# Patient Record
Sex: Female | Born: 1935
Health system: Southern US, Community
[De-identification: ages and names within clinical notes are randomized; demographics above are authoritative.]

## PROBLEM LIST (undated history)

## (undated) DIAGNOSIS — M1990 Unspecified osteoarthritis, unspecified site: Secondary | ICD-10-CM

## (undated) DIAGNOSIS — H269 Unspecified cataract: Secondary | ICD-10-CM

## (undated) DIAGNOSIS — H6990 Unspecified Eustachian tube disorder, unspecified ear: Secondary | ICD-10-CM

## (undated) DIAGNOSIS — D649 Anemia, unspecified: Secondary | ICD-10-CM

## (undated) DIAGNOSIS — H6980 Other specified disorders of Eustachian tube, unspecified ear: Secondary | ICD-10-CM

## (undated) DIAGNOSIS — Z5189 Encounter for other specified aftercare: Secondary | ICD-10-CM

## (undated) DIAGNOSIS — R3129 Other microscopic hematuria: Secondary | ICD-10-CM

## (undated) HISTORY — DX: Unspecified osteoarthritis, unspecified site: M19.90

## (undated) HISTORY — PX: DILATION AND CURETTAGE OF UTERUS: SHX78

## (undated) HISTORY — DX: Unspecified eustachian tube disorder, unspecified ear: H69.90

## (undated) HISTORY — DX: Other microscopic hematuria: R31.29

## (undated) HISTORY — DX: Anemia, unspecified: D64.9

## (undated) HISTORY — DX: Unspecified cataract: H26.9

## (undated) HISTORY — PX: COLONOSCOPY: SHX174

## (undated) HISTORY — PX: ABDOMINAL HYSTERECTOMY: SHX81

## (undated) HISTORY — DX: Other specified disorders of Eustachian tube, unspecified ear: H69.80

## (undated) HISTORY — PX: APPENDECTOMY: SHX54

## (undated) HISTORY — PX: TONSILLECTOMY: SUR1361

## (undated) HISTORY — DX: Encounter for other specified aftercare: Z51.89

---

## 1999-07-14 ENCOUNTER — Encounter: Payer: Self-pay | Admitting: Obstetrics and Gynecology

## 1999-07-14 ENCOUNTER — Encounter: Admission: RE | Admit: 1999-07-14 | Discharge: 1999-07-14 | Payer: Self-pay | Admitting: Obstetrics and Gynecology

## 2000-07-15 ENCOUNTER — Encounter: Admission: RE | Admit: 2000-07-15 | Discharge: 2000-07-15 | Payer: Self-pay | Admitting: Family Medicine

## 2000-07-15 ENCOUNTER — Encounter: Payer: Self-pay | Admitting: Family Medicine

## 2001-05-04 ENCOUNTER — Encounter: Payer: Self-pay | Admitting: Internal Medicine

## 2001-06-28 ENCOUNTER — Other Ambulatory Visit: Admission: RE | Admit: 2001-06-28 | Discharge: 2001-06-28 | Payer: Self-pay | Admitting: Obstetrics & Gynecology

## 2001-07-25 ENCOUNTER — Encounter: Payer: Self-pay | Admitting: Family Medicine

## 2001-07-25 ENCOUNTER — Encounter: Admission: RE | Admit: 2001-07-25 | Discharge: 2001-07-25 | Payer: Self-pay | Admitting: Family Medicine

## 2002-07-24 ENCOUNTER — Other Ambulatory Visit: Admission: RE | Admit: 2002-07-24 | Discharge: 2002-07-24 | Payer: Self-pay | Admitting: Obstetrics & Gynecology

## 2002-07-31 ENCOUNTER — Encounter: Admission: RE | Admit: 2002-07-31 | Discharge: 2002-07-31 | Payer: Self-pay | Admitting: Family Medicine

## 2002-07-31 ENCOUNTER — Encounter: Payer: Self-pay | Admitting: Family Medicine

## 2003-04-30 ENCOUNTER — Encounter: Payer: Self-pay | Admitting: Internal Medicine

## 2003-08-21 ENCOUNTER — Other Ambulatory Visit: Admission: RE | Admit: 2003-08-21 | Discharge: 2003-08-21 | Payer: Self-pay | Admitting: Obstetrics and Gynecology

## 2003-08-30 ENCOUNTER — Encounter: Admission: RE | Admit: 2003-08-30 | Discharge: 2003-08-30 | Payer: Self-pay | Admitting: Family Medicine

## 2004-04-28 ENCOUNTER — Ambulatory Visit: Payer: Self-pay | Admitting: Family Medicine

## 2004-09-11 ENCOUNTER — Encounter: Admission: RE | Admit: 2004-09-11 | Discharge: 2004-09-11 | Payer: Self-pay | Admitting: Family Medicine

## 2004-09-30 ENCOUNTER — Ambulatory Visit: Payer: Self-pay | Admitting: Family Medicine

## 2005-03-26 ENCOUNTER — Ambulatory Visit: Payer: Self-pay | Admitting: Family Medicine

## 2005-08-25 ENCOUNTER — Ambulatory Visit: Payer: Self-pay | Admitting: Internal Medicine

## 2005-09-13 ENCOUNTER — Encounter: Payer: Self-pay | Admitting: Internal Medicine

## 2005-09-13 ENCOUNTER — Encounter: Admission: RE | Admit: 2005-09-13 | Discharge: 2005-09-13 | Payer: Self-pay | Admitting: Internal Medicine

## 2005-10-22 ENCOUNTER — Ambulatory Visit: Payer: Self-pay | Admitting: Internal Medicine

## 2005-11-08 ENCOUNTER — Ambulatory Visit: Payer: Self-pay | Admitting: Internal Medicine

## 2006-01-24 ENCOUNTER — Ambulatory Visit: Payer: Self-pay | Admitting: Internal Medicine

## 2006-09-16 ENCOUNTER — Encounter: Admission: RE | Admit: 2006-09-16 | Discharge: 2006-09-16 | Payer: Self-pay | Admitting: Internal Medicine

## 2006-09-27 ENCOUNTER — Encounter: Admission: RE | Admit: 2006-09-27 | Discharge: 2006-09-27 | Payer: Self-pay | Admitting: Internal Medicine

## 2006-10-26 DIAGNOSIS — Z9889 Other specified postprocedural states: Secondary | ICD-10-CM

## 2006-10-28 ENCOUNTER — Ambulatory Visit: Payer: Self-pay | Admitting: Internal Medicine

## 2006-10-28 LAB — CONVERTED CEMR LAB
ALT: 15 units/L (ref 0–40)
Bilirubin, Direct: 0.1 mg/dL (ref 0.0–0.3)
Cholesterol: 196 mg/dL (ref 0–200)
HDL: 58 mg/dL (ref >39.0)
LDL Cholesterol: 116 mg/dL — ABNORMAL HIGH (ref 0–99)
Total CHOL/HDL Ratio: 3.4
Triglycerides: 109 mg/dL (ref 0–149)

## 2006-11-02 ENCOUNTER — Ambulatory Visit: Payer: Self-pay | Admitting: Internal Medicine

## 2006-11-11 ENCOUNTER — Telehealth (INDEPENDENT_AMBULATORY_CARE_PROVIDER_SITE_OTHER): Payer: Self-pay | Admitting: *Deleted

## 2006-11-15 ENCOUNTER — Encounter: Payer: Self-pay | Admitting: Internal Medicine

## 2006-11-15 LAB — CONVERTED CEMR LAB: Hemoglobin: 12.9 g/dL

## 2006-11-16 ENCOUNTER — Telehealth: Payer: Self-pay | Admitting: Internal Medicine

## 2006-11-16 ENCOUNTER — Encounter (INDEPENDENT_AMBULATORY_CARE_PROVIDER_SITE_OTHER): Payer: Self-pay | Admitting: *Deleted

## 2007-03-13 ENCOUNTER — Encounter: Payer: Self-pay | Admitting: Internal Medicine

## 2007-03-17 ENCOUNTER — Encounter: Payer: Self-pay | Admitting: Internal Medicine

## 2007-03-21 ENCOUNTER — Encounter: Payer: Self-pay | Admitting: Internal Medicine

## 2007-03-21 ENCOUNTER — Telehealth: Payer: Self-pay | Admitting: Internal Medicine

## 2007-03-21 ENCOUNTER — Encounter (INDEPENDENT_AMBULATORY_CARE_PROVIDER_SITE_OTHER): Payer: Self-pay | Admitting: Urology

## 2007-03-21 ENCOUNTER — Ambulatory Visit (HOSPITAL_BASED_OUTPATIENT_CLINIC_OR_DEPARTMENT_OTHER): Admission: RE | Admit: 2007-03-21 | Discharge: 2007-03-21 | Payer: Self-pay | Admitting: Nurse Practitioner

## 2007-03-23 ENCOUNTER — Encounter: Payer: Self-pay | Admitting: Internal Medicine

## 2007-05-05 ENCOUNTER — Encounter: Payer: Self-pay | Admitting: Internal Medicine

## 2007-09-13 ENCOUNTER — Ambulatory Visit: Payer: Self-pay | Admitting: Internal Medicine

## 2007-09-13 ENCOUNTER — Telehealth (INDEPENDENT_AMBULATORY_CARE_PROVIDER_SITE_OTHER): Payer: Self-pay | Admitting: *Deleted

## 2007-09-27 ENCOUNTER — Encounter: Admission: RE | Admit: 2007-09-27 | Discharge: 2007-09-27 | Payer: Self-pay | Admitting: Internal Medicine

## 2007-10-02 ENCOUNTER — Encounter (INDEPENDENT_AMBULATORY_CARE_PROVIDER_SITE_OTHER): Payer: Self-pay | Admitting: *Deleted

## 2008-02-19 ENCOUNTER — Telehealth: Payer: Self-pay | Admitting: Internal Medicine

## 2008-04-03 ENCOUNTER — Encounter: Payer: Self-pay | Admitting: Internal Medicine

## 2008-04-17 ENCOUNTER — Ambulatory Visit: Payer: Self-pay | Admitting: Internal Medicine

## 2008-06-04 ENCOUNTER — Telehealth (INDEPENDENT_AMBULATORY_CARE_PROVIDER_SITE_OTHER): Payer: Self-pay | Admitting: *Deleted

## 2008-07-01 ENCOUNTER — Encounter: Payer: Self-pay | Admitting: Internal Medicine

## 2008-07-31 ENCOUNTER — Encounter: Payer: Self-pay | Admitting: Internal Medicine

## 2008-08-28 ENCOUNTER — Encounter: Payer: Self-pay | Admitting: Internal Medicine

## 2008-09-02 ENCOUNTER — Encounter: Payer: Self-pay | Admitting: Internal Medicine

## 2008-09-25 ENCOUNTER — Telehealth (INDEPENDENT_AMBULATORY_CARE_PROVIDER_SITE_OTHER): Payer: Self-pay | Admitting: *Deleted

## 2008-09-27 ENCOUNTER — Ambulatory Visit (HOSPITAL_BASED_OUTPATIENT_CLINIC_OR_DEPARTMENT_OTHER): Admission: RE | Admit: 2008-09-27 | Discharge: 2008-09-27 | Payer: Self-pay | Admitting: Internal Medicine

## 2008-09-27 ENCOUNTER — Ambulatory Visit: Payer: Self-pay | Admitting: Diagnostic Radiology

## 2008-10-04 ENCOUNTER — Ambulatory Visit: Payer: Self-pay | Admitting: Diagnostic Radiology

## 2008-10-04 ENCOUNTER — Telehealth: Payer: Self-pay | Admitting: Internal Medicine

## 2008-10-04 ENCOUNTER — Ambulatory Visit (HOSPITAL_BASED_OUTPATIENT_CLINIC_OR_DEPARTMENT_OTHER): Admission: RE | Admit: 2008-10-04 | Discharge: 2008-10-04 | Payer: Self-pay | Admitting: Internal Medicine

## 2008-10-07 ENCOUNTER — Ambulatory Visit: Payer: Self-pay | Admitting: Internal Medicine

## 2008-10-09 ENCOUNTER — Ambulatory Visit (HOSPITAL_BASED_OUTPATIENT_CLINIC_OR_DEPARTMENT_OTHER): Admission: RE | Admit: 2008-10-09 | Discharge: 2008-10-09 | Payer: Self-pay | Admitting: Internal Medicine

## 2008-10-09 ENCOUNTER — Ambulatory Visit: Payer: Self-pay | Admitting: Diagnostic Radiology

## 2008-11-28 ENCOUNTER — Telehealth (INDEPENDENT_AMBULATORY_CARE_PROVIDER_SITE_OTHER): Payer: Self-pay | Admitting: *Deleted

## 2008-11-29 ENCOUNTER — Ambulatory Visit: Payer: Self-pay | Admitting: Internal Medicine

## 2008-12-04 ENCOUNTER — Telehealth (INDEPENDENT_AMBULATORY_CARE_PROVIDER_SITE_OTHER): Payer: Self-pay | Admitting: *Deleted

## 2008-12-22 ENCOUNTER — Emergency Department (HOSPITAL_BASED_OUTPATIENT_CLINIC_OR_DEPARTMENT_OTHER): Admission: EM | Admit: 2008-12-22 | Discharge: 2008-12-22 | Payer: Self-pay | Admitting: Emergency Medicine

## 2009-03-18 ENCOUNTER — Telehealth (INDEPENDENT_AMBULATORY_CARE_PROVIDER_SITE_OTHER): Payer: Self-pay | Admitting: *Deleted

## 2009-04-25 ENCOUNTER — Ambulatory Visit: Payer: Self-pay | Admitting: Internal Medicine

## 2009-05-01 ENCOUNTER — Encounter: Payer: Self-pay | Admitting: Internal Medicine

## 2009-05-01 ENCOUNTER — Encounter (INDEPENDENT_AMBULATORY_CARE_PROVIDER_SITE_OTHER): Payer: Self-pay | Admitting: *Deleted

## 2009-05-07 ENCOUNTER — Ambulatory Visit: Payer: Self-pay | Admitting: Internal Medicine

## 2009-05-08 ENCOUNTER — Telehealth (INDEPENDENT_AMBULATORY_CARE_PROVIDER_SITE_OTHER): Payer: Self-pay | Admitting: *Deleted

## 2009-05-08 ENCOUNTER — Encounter (INDEPENDENT_AMBULATORY_CARE_PROVIDER_SITE_OTHER): Payer: Self-pay | Admitting: *Deleted

## 2009-05-08 LAB — CONVERTED CEMR LAB
BUN: 16 mg/dL (ref 6–23)
Basophils Relative: 0.5 % (ref 0.0–3.0)
CO2: 31 meq/L (ref 19–32)
Calcium: 8.9 mg/dL (ref 8.4–10.5)
Eosinophils Absolute: 0.2 10*3/uL (ref 0.0–0.7)
HDL: 70 mg/dL (ref >39.00)
Lymphocytes Relative: 27.4 % (ref 12.0–46.0)
MCV: 98.3 fL (ref 78.0–100.0)
Neutro Abs: 3.2 10*3/uL (ref 1.4–7.7)
Platelets: 169 10*3/uL (ref 150.0–400.0)
Potassium: 4.2 meq/L (ref 3.5–5.1)
RDW: 12.7 % (ref 11.5–14.6)
Sodium: 144 meq/L (ref 135–145)
Total CHOL/HDL Ratio: 3
Triglycerides: 55 mg/dL (ref 0.0–149.0)
VLDL: 11 mg/dL (ref 0.0–40.0)
Vit D, 25-Hydroxy: 40 ng/mL (ref 30–89)
WBC: 5.6 10*3/uL (ref 4.5–10.5)

## 2009-06-05 DIAGNOSIS — M810 Age-related osteoporosis without current pathological fracture: Secondary | ICD-10-CM

## 2009-07-16 ENCOUNTER — Encounter: Payer: Self-pay | Admitting: Internal Medicine

## 2009-08-04 ENCOUNTER — Ambulatory Visit: Payer: Self-pay | Admitting: Internal Medicine

## 2009-08-05 ENCOUNTER — Telehealth (INDEPENDENT_AMBULATORY_CARE_PROVIDER_SITE_OTHER): Payer: Self-pay | Admitting: *Deleted

## 2009-08-05 ENCOUNTER — Encounter: Payer: Self-pay | Admitting: Internal Medicine

## 2009-08-05 LAB — CONVERTED CEMR LAB
Calcium: 9 mg/dL (ref 8.4–10.5)
Creatinine, Ser: 0.8 mg/dL (ref 0.4–1.2)

## 2009-08-22 ENCOUNTER — Ambulatory Visit (HOSPITAL_COMMUNITY): Admission: RE | Admit: 2009-08-22 | Discharge: 2009-08-22 | Payer: Self-pay | Admitting: Internal Medicine

## 2009-08-22 ENCOUNTER — Encounter: Payer: Self-pay | Admitting: Internal Medicine

## 2009-10-21 ENCOUNTER — Ambulatory Visit: Payer: Self-pay | Admitting: Diagnostic Radiology

## 2009-10-21 ENCOUNTER — Ambulatory Visit (HOSPITAL_BASED_OUTPATIENT_CLINIC_OR_DEPARTMENT_OTHER): Admission: RE | Admit: 2009-10-21 | Discharge: 2009-10-21 | Payer: Self-pay | Admitting: Internal Medicine

## 2009-10-24 ENCOUNTER — Ambulatory Visit: Payer: Self-pay | Admitting: Internal Medicine

## 2009-10-24 DIAGNOSIS — M1990 Unspecified osteoarthritis, unspecified site: Secondary | ICD-10-CM

## 2009-12-30 ENCOUNTER — Emergency Department (HOSPITAL_BASED_OUTPATIENT_CLINIC_OR_DEPARTMENT_OTHER): Admission: EM | Admit: 2009-12-30 | Discharge: 2009-12-30 | Payer: Self-pay | Admitting: Emergency Medicine

## 2010-01-02 ENCOUNTER — Ambulatory Visit: Payer: Self-pay | Admitting: Internal Medicine

## 2010-01-02 DIAGNOSIS — J069 Acute upper respiratory infection, unspecified: Secondary | ICD-10-CM

## 2010-01-09 ENCOUNTER — Telehealth: Payer: Self-pay | Admitting: Family Medicine

## 2010-07-12 ENCOUNTER — Encounter: Payer: Self-pay | Admitting: Internal Medicine

## 2010-07-21 NOTE — Medication Information (Signed)
Summary: Patient Assistance Form/Reclast  Patient Assistance Form/Reclast   Imported By: Lanelle Bal 08/05/2009 12:51:42  _____________________________________________________________________  External Attachment:    Type:   Image     Comment:   External Document

## 2010-07-21 NOTE — Assessment & Plan Note (Signed)
Summary: cough/cbs   Vital Signs:  Patient profile:   77 year old female Weight:      144 pounds Temp:     97.3 degrees F oral Pulse rate:   70 / minute Pulse rhythm:   regular BP sitting:   124 / 80  (left arm) Cuff size:   regular  Vitals Entered By: Army Fossa CMA (January 02, 2010 10:58 AM) CC: C/o coughing when laying flat in bed, pink eye also. Comments Went to the [facility] earlier in the week and was treated for pink eye thinks it is spreading to the other eye.    History of Present Illness: 4 days ago, developed postnasal driping, right eye redness Went to a local urgent care, diagnosed with pink eye She was started on erythromycin drops At this point she is no better, the left eye is now also involved with the redness Both eyes are slightly itchy She has a dry cough  ROS Low-grade fever? No sinus pain but a lot of sinus congestion and nasal discharge, mostly clear but she is so a little bit of blood this morning Denies decreased vision, eyeball pain  Allergies (verified): No Known Drug Allergies  Past History:  Past Medical History: Reviewed history from 04/25/2009 and no changes required. Osteoporosis OA, h/o back pain , sees ortho, s/p shots  h/o microhematuria s/p eval by urology 2008: U/S , CT, cysto w/ neg Bx  (dx w/ a renal cyst, see reports)  Past Surgical History: Reviewed history from 04/25/2009 and no changes required. Appendectomy Hysterectomy, no oophorectomy per patient  Tonsillectomy h/o D/C  Social History: Reviewed history from 10/24/2009 and no changes required. married no children Tobacco-- quit in the 13s ETOH-- socially     Review of Systems      See HPI  Physical Exam  General:  alert and well-developed.   Head:  symmetric, nontender to palpation of the maxillary sinuses Eyes:  EOMI both conjunctivas are red Mild discharge, right worse than left Anterior chambers normal Pupils equal and reactive Nose:   congested Mouth:  no redness or discharge Lungs:  normal respiratory effort, no intercostal retractions, and no accessory muscle use.  rhonchi with cough only. No respiratory distress   Impression & Recommendations:  Problem # 1:  URI (ICD-465.9)  She has clearly respiratory symptoms, unclear if she has a separate infection ( pink eye) or the eye redness is related to the coryza see instructions  Orders: Prescription Created Electronically [phone])  Complete Medication List: 1)  Reclast 5 Mg/140ml Soln (Zoledronic acid) .... Once a year 2)  Caltrate  3)  Fiber One  4)  Centrum Silver  5)  Romycin 5 Mg/gm Oint (Erythromycin) 6)  Zithromax Z-pak 250 Mg Tabs (Azithromycin) .... As directed  Patient Instructions: 1)  rest, fluids, Tylenol, Robitussin-DM 2)  Hold eyedrops 3)  Start a Z-Pak 4)  Apply cold compresses to the eyes 3 times a day 5)  call  if no better in few days 6)  call any time if you feel much worse or if you have severe eye pain Prescriptions: ZITHROMAX Z-PAK 250 MG TABS (AZITHROMYCIN) as directed  #1 x 0   Entered and Authorized by:   Nolon Rod. Jolayne Branson MD   Signed by:   Nolon Rod. Antonella Upson MD on 01/02/2010   Method used:   Electronically to        [facility] (retail)       [address]  [address]       [place], [place]  [address]       Ph: [phone]       Fax: [phone]   RxID:   [id number]

## 2010-07-21 NOTE — Assessment & Plan Note (Signed)
Summary: FOLLOW UP/RH......[facility]   Vital Signs:  Patient profile:   77 year old female Height:      65.5 inches Weight:      145.8 pounds BMI:     23.98 Pulse rate:   64 / minute BP sitting:   142 / 84  Vitals Entered By: Shary Decamp (Oct 24, 2009 8:08 AM) CC: ROV, FASTING   History of Present Illness: ROV feels well   Current Medications (verified): 1)  Reclast 5 Mg/15ml Soln (Zoledronic Acid) .... Once A Year 2)  Caltrate 3)  Fiber One 4)  Centrum Silver  Allergies (verified): No Known Drug Allergies  Past History:  Past Medical History: Reviewed history from 04/25/2009 and no changes required. Osteoporosis OA, h/o back pain , sees ortho, s/p shots  h/o microhematuria s/p eval by urology 2008: U/S , CT, cysto w/ neg Bx  (dx w/ a renal cyst, see reports)  Past Surgical History: Reviewed history from 04/25/2009 and no changes required. Appendectomy Hysterectomy, no oophorectomy per patient  Tonsillectomy h/o D/C  Social History: married no children Tobacco-- quit in the 78s ETOH-- socially     Review of Systems       Osteoporosis-- s/p reclast , tolerated well  OA-- occasionally aches and pains   General:  used to exercise more , lans to go back  diet-- good most of the time  good medication compliance w / ca and vit D had a MMG few days ago . CV:  Denies chest pain or discomfort and swelling of feet. Resp:  Denies cough and shortness of breath.  Physical Exam  General:  alert and well-developed.   Lungs:  normal respiratory effort, no intercostal retractions, no accessory muscle use, and normal breath sounds.   Heart:  normal rate, regular rhythm, and no murmur.   Extremities:  no pretibial edema bilaterally  Psych:  Oriented X3, memory intact for recent and remote, normally interactive, good eye contact, not anxious appearing, and not depressed appearing.     Impression & Recommendations:  Problem # 1:  OSTEOPOROSIS (ICD-733.00) status  post reclast, tolerated   well Her updated medication list for this problem includes:    Reclast 5 Mg/142ml Soln (Zoledronic acid) ..... Once a year  Problem # 2:  DEGENERATIVE JOINT DISEASE (ICD-715.90) stable, aches and pains but remains active   Problem # 3:  HEALTH SCREENING (ICD-V70.0) will RTC by 3-12 , right before her next Reclast infussion  Complete Medication List: 1)  Reclast 5 Mg/177ml Soln (Zoledronic acid) .... Once a year 2)  Caltrate  3)  Fiber One  4)  Centrum Silver   Patient Instructions: 1)  please schedule an appointment , fasting, for early March 2012  (yearly check up)

## 2010-07-21 NOTE — Op Note (Signed)
Summary: Reclast Infusion Orders/[facility]  [facility] Infusion Orders/[facility]   Imported By: Lanelle Bal 08/09/2009 09:11:39  _____________________________________________________________________  External Attachment:    Type:   Image     Comment:   External Document

## 2010-07-21 NOTE — Progress Notes (Signed)
Summary: Still congested...  Phone Note Call from Patient Call back at Home Phone [phone]   Caller: Patient Reason for Call: Talk to Nurse, Talk to Doctor Summary of Call: Patient called to let us know that she is still having a dry cough and is trying to cough something up and is not successful. She finished her zpack on thursday and was taking robitussin. She is leaving this sunday for a 10 day trip, is there anything else she can do?  Initial call taken by: Harold Barban,  January 09, 2010 12:39 PM  Follow-up for Phone Call        can take mucinex to breakup the congestion.  if she just finished the Zpack yesterday it will stay in her system for another 5 days. drink plenty of fluids.  Follow-up by: Neena Rhymes MD,  January 09, 2010 1:12 PM  Additional Follow-up for Phone Call Additional follow up Details #1::        Patient is aware of information. She will call for an OV if she still doesnt feel better after her trip.  Additional Follow-up by: Harold Barban,  January 09, 2010 1:52 PM

## 2010-07-21 NOTE — Progress Notes (Signed)
Summary: appt, [facility] 2/15  RECLAST APPT: Thurs 08/14/2009 @ 9am [facility] Drink 2 glasses of water 1 hour prior to appt If pt needs to reschedule, pls call [phone] & ask for short stay left message on machine for pt to return call Shary Decamp  August 05, 2009 3:07 PM  Pt informed of appt time ans Phone # to call if need to reschedule .Kandice Hams  August 06, 2009 1:54 PM

## 2010-07-21 NOTE — Op Note (Signed)
Summary: Reclast Infusion/[place] Short Stay  Reclast Infusion/[place] Short Stay   Imported By: Lanelle Bal 08/28/2009 09:53:17  _____________________________________________________________________  External Attachment:    Type:   Image     Comment:   External Document

## 2010-08-14 ENCOUNTER — Encounter: Payer: Self-pay | Admitting: Internal Medicine

## 2010-08-14 ENCOUNTER — Ambulatory Visit (INDEPENDENT_AMBULATORY_CARE_PROVIDER_SITE_OTHER): Payer: Medicare Other | Admitting: Internal Medicine

## 2010-08-14 ENCOUNTER — Other Ambulatory Visit: Payer: Self-pay | Admitting: Internal Medicine

## 2010-08-14 DIAGNOSIS — Z13220 Encounter for screening for lipoid disorders: Secondary | ICD-10-CM

## 2010-08-14 DIAGNOSIS — E785 Hyperlipidemia, unspecified: Secondary | ICD-10-CM

## 2010-08-14 DIAGNOSIS — M810 Age-related osteoporosis without current pathological fracture: Secondary | ICD-10-CM

## 2010-08-14 LAB — BASIC METABOLIC PANEL
BUN: 14 mg/dL (ref 6–23)
CO2: 33 mEq/L — ABNORMAL HIGH (ref 19–32)
Calcium: 9.2 mg/dL (ref 8.4–10.5)
Glucose, Bld: 86 mg/dL (ref 70–99)
Sodium: 143 mEq/L (ref 135–145)

## 2010-08-14 LAB — LIPID PANEL
Cholesterol: 217 mg/dL — ABNORMAL HIGH (ref 0–200)
Total CHOL/HDL Ratio: 3
Triglycerides: 73 mg/dL (ref 0.0–149.0)

## 2010-08-18 NOTE — Assessment & Plan Note (Signed)
Summary: for her reclast---[phone]   Vital Signs:  Patient profile:   77 year old female Height:      65.5 inches Weight:      147.13 pounds BMI:     24.20 Pulse rate:   70 / minute Pulse rhythm:   regular BP sitting:   116 / 70  (left arm) Cuff size:   regular  Vitals Entered By: Army Fossa CMA (August 14, 2010 10:38 AM) CC: Pt here to discuss Reclast-fasting Comments [employer]   History of Present Illness:  here to discuss osteoporosis  reclast ? Chart is reviewed  She took Fosamax or Actonel for many years  DEXA 2007 had a T score of -2.4  she took [place] for one year in 2008, she had to quit because  insurance issues DEXA 04-2009 T score -2.87  based on the deteriorated T score, she was prescribed reclast, she took it a year ago with no side effects.   Current Medications (verified): 1)  Reclast 5 Mg/175ml Soln (Zoledronic Acid) .... Once A Year 2)  Caltrate 3)  Fiber One 4)  Centrum Silver 5)  Romycin 5 Mg/gm Oint (Erythromycin) 6)  Zithromax Z-Pak 250 Mg Tabs (Azithromycin) .... As Directed  Allergies (verified): No Known Drug Allergies  Past History:  Past Medical History: Reviewed history from 04/25/2009 and no changes required. Osteoporosis OA, h/o back pain , sees ortho, s/p shots  h/o microhematuria s/p eval by urology 2008: U/S , CT, cysto w/ neg Bx  (dx w/ a renal cyst, see reports)  Past Surgical History: Reviewed history from 04/25/2009 and no changes required. Appendectomy Hysterectomy, no oophorectomy per patient  Tonsillectomy h/o D/C  Social History: Reviewed history from 10/24/2009 and no changes required. married no children Tobacco-- quit in the 77s ETOH-- socially     Review of Systems        good compliance with calcium and vitamin D  exercise only occasionally. In the last few weeks, [facility] and [facility]obics  Physical Exam  General:  alert, well-developed, and  well-nourished.   Psych:  Oriented X3, good eye contact, not anxious appearing, and not depressed appearing.     Impression & Recommendations:  Problem # 1:  OSTEOPOROSIS (ICD-733.00) Chart is reviewed  She took Fosamax or Actonel for many years  DEXA 2007 had a T score of -2.4  she took [place] for one year in 2008, she had to quit because  insurance issues DEXA 04-2009 T score -2.87  based on the deteriorated T score, she was prescribed reclast, she took it a year ago with no side effects. plan: labs reclast w/ results DEXA 04-2011, if T score increrasing  ?prolia Her updated medication list for this problem includes:    Reclast 5 Mg/152ml Soln (Zoledronic acid) ..... Once a year  Orders: Venipuncture ([address]) TLB-BMP (Basic Metabolic Panel-BMET) (80048-METABOL) T-Vitamin D (25-Hydroxy) ([address]11) Specimen Handling ([address]78)  Problem # 2:  F2F > 15 min, > 50%of time reviewing chart and counseling about dx and treatment  Complete Medication List: 1)  Reclast 5 Mg/159ml Soln (Zoledronic acid) .... Once a year 2)  Caltrate  3)  Fiber One  4)  Centrum Silver  5)  Romycin 5 Mg/gm Oint (Erythromycin) 6)  Zithromax Z-pak 250 Mg Tabs (Azithromycin) .... As directed  Other Orders: TLB-Lipid Panel (80061-LIPID)  Patient Instructions: 1)  Please schedule a follow-up appointment in 4 months . Physical exam    Orders  Added: 1)  Venipuncture [36415] 2)  TLB-BMP (Basic Metabolic Panel-BMET) [80048-METABOL] 3)  T-Vitamin D (25-Hydroxy) [[address]] 4)  TLB-Lipid Panel [80061-LIPID] 5)  Specimen Handling [99000] 6)  Est. Patient Level III [09811]

## 2010-09-04 ENCOUNTER — Telehealth (INDEPENDENT_AMBULATORY_CARE_PROVIDER_SITE_OTHER): Payer: Self-pay | Admitting: *Deleted

## 2010-09-08 NOTE — Progress Notes (Signed)
Summary: hasnt heard about  reclast appointment  Phone Note Call from Patient Call back at Home Phone [phone]   Caller: Patient Summary of Call: patient says she was told that hospital would call her about her reclast apppointment and as of 12:30 on Fri 09/04/2010---she has not heard from anyone---please call her at [phone] and advise Initial call taken by: Jerolyn Shin,  September 04, 2010 12:52 PM  Follow-up for Phone Call        spoke w/ patient infomed that she needs to contact short stary for appt phone # provided .....Marland KitchenMarland KitchenDoristine Devoid CMA  September 04, 2010 3:34 PM

## 2010-09-15 ENCOUNTER — Other Ambulatory Visit: Payer: Self-pay | Admitting: Internal Medicine

## 2010-09-15 ENCOUNTER — Ambulatory Visit (HOSPITAL_COMMUNITY): Payer: Medicare Other | Attending: Internal Medicine

## 2010-09-15 DIAGNOSIS — M810 Age-related osteoporosis without current pathological fracture: Secondary | ICD-10-CM | POA: Insufficient documentation

## 2010-09-15 LAB — COMPREHENSIVE METABOLIC PANEL
ALT: 16 U/L (ref 0–35)
AST: 25 U/L (ref 0–37)
Alkaline Phosphatase: 58 U/L (ref 39–117)
CO2: 32 mEq/L (ref 19–32)
Calcium: 9.3 mg/dL (ref 8.4–10.5)
Creatinine, Ser: 0.72 mg/dL (ref 0.4–1.2)
GFR calc non Af Amer: >60 mL/min (ref >60)
Glucose, Bld: 92 mg/dL (ref 70–99)
Total Protein: 6.4 g/dL (ref 6.0–8.3)

## 2010-10-01 ENCOUNTER — Other Ambulatory Visit: Payer: Self-pay | Admitting: Internal Medicine

## 2010-10-01 DIAGNOSIS — Z1231 Encounter for screening mammogram for malignant neoplasm of breast: Secondary | ICD-10-CM

## 2010-10-28 ENCOUNTER — Ambulatory Visit (HOSPITAL_BASED_OUTPATIENT_CLINIC_OR_DEPARTMENT_OTHER)
Admission: RE | Admit: 2010-10-28 | Discharge: 2010-10-28 | Disposition: A | Discharge status: Home / Self Care | Payer: Medicare Other | Source: Ambulatory Visit | Attending: Internal Medicine | Admitting: Internal Medicine

## 2010-10-28 DIAGNOSIS — Z1231 Encounter for screening mammogram for malignant neoplasm of breast: Secondary | ICD-10-CM

## 2010-11-03 NOTE — Op Note (Signed)
NAME:  Kimberly Rodriguez, Kimberly Rodriguez              ACCOUNT NO.:  [id number]   MEDICAL RECORD NO.:  [id number]          PATIENT TYPE:  AMB   LOCATION:  [facility]                         FACILITY:  [facility]   PHYSICIAN:  Boston Service, M.D.DATE OF BIRTH:  [birth year]   DATE OF PROCEDURE:  03/21/2007  DATE OF DISCHARGE:                               OPERATIVE REPORT   PREOPERATIVE DIAGNOSIS:  77 year old female.  I have reviewed concise  and relevant clinical notes forwarded by Dr. Drue Novel.  The patient quit  smoking about 30 years ago.  Has had persistent irritative voiding  symptoms with microhematuria refractory to appropriate antibiotic  therapy.  Fiberoptic cystoscopy shows scattered erythematous patches  within the bladder.  No evidence of bacterial cystitis.  Plans made for  bladder biopsy rule out CIS.  Upper tract studies, 11.5-cm right  kidney, 10.1-cm left kidney by ultrasound.  Rounded area mid pole left  kidney, 22 x 23 mm.  Additional workup included CT of the abdomen and  pelvis on March 17, 2007, Bosniak I left renal cyst with adrenal  adenoma or exophytic cyst above the left renal space.  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   POSTOPERATIVE DIAGNOSIS:  77 year old female.  I have reviewed concise  and relevant clinical notes forwarded by Dr. Drue Novel.  The patient quit  smoking about 30 years ago.  Has had persistent irritative voiding  symptoms with microhematuria refractory to appropriate antibiotic  therapy.  Fiberoptic cystoscopy shows scattered erythematous patches  within the bladder.  No evidence of bacterial cystitis.  Plans made for  bladder biopsy rule out CIS.  Upper tract studies, 11.5-cm right  kidney, 10.1-cm left kidney by ultrasound.  Rounded area mid pole left  kidney, 22 x 23 mm.  Additional  workup included CT of the abdomen and  pelvis on March 17, 2007, Bosniak I left renal cyst with adrenal  adenoma or exophytic cyst above the left renal space.  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   PROCEDURE:  Cystoscopy, retrograde, bladder biopsy.   ANESTHESIA:  General.   DRAINS:  20-French Foley.   ANESTHESIA:  General.   SURGEON:  Boston Service, M.D.   ESTIMATED BLOOD LOSS:  Minimal.   SPECIMENS:  To pathology.   DESCRIPTION OF PROCEDURE:  The patient was prepped and draped in the  dorsal lithotomy position after institution of an adequate level of  general anesthesia.  A well-lubricated 21-French panendoscope was gently  inserted at the urethral meatus.  Normal urethra and sphincter.  Clear  reflux, right and left orifice.  Bladder was carefully inspected and  showed scattered erythematous lesions, probably 6-10 throughout the  bladder.  Once appropriate endoscopic photographs had been taken and the  urothelium carefully inspected with the 12 and 70-degree  lenses,  retrograde films were performed.   Blocking catheter was selected, positioned at the right ureteral orifice  and left ureteral orifice.  With gentle injection of contrast, the  ureters were outlined without filling defect or obstruction.  Prompt  drainage at 3-5 minutes.   Biopsy forceps were then selected.  Representative samples were taken  from the left posterior wall x6.  Cystoscope was then removed and  replaced with the VaporTrode element.  Adequate hemostasis was obtained.  Resectoscope sheath was withdrawn.  20-French Foley was inserted.  Bladder was then irrigated with sterile water for about 15 minutes.  Catheter was left to straight drain.   The patient was returned to recovery in satisfactory  condition.           ______________________________  Boston Service, M.D.     RH/MEDQ  D:  03/21/2007  T:  03/21/2007  Job:  [id number]   cc:   Willow Ora, MD  [phone] [address] [place], [place] [address]

## 2010-12-07 ENCOUNTER — Encounter: Payer: Self-pay | Admitting: Internal Medicine

## 2010-12-11 ENCOUNTER — Ambulatory Visit (INDEPENDENT_AMBULATORY_CARE_PROVIDER_SITE_OTHER): Payer: Medicare Other | Admitting: Internal Medicine

## 2010-12-11 ENCOUNTER — Encounter: Payer: Self-pay | Admitting: *Deleted

## 2010-12-11 ENCOUNTER — Encounter: Payer: Self-pay | Admitting: Internal Medicine

## 2010-12-11 DIAGNOSIS — M1990 Unspecified osteoarthritis, unspecified site: Secondary | ICD-10-CM

## 2010-12-11 DIAGNOSIS — M810 Age-related osteoporosis without current pathological fracture: Secondary | ICD-10-CM

## 2010-12-11 DIAGNOSIS — Z23 Encounter for immunization: Secondary | ICD-10-CM

## 2010-12-11 DIAGNOSIS — R252 Cramp and spasm: Secondary | ICD-10-CM | POA: Insufficient documentation

## 2010-12-11 DIAGNOSIS — Z13220 Encounter for screening for lipoid disorders: Secondary | ICD-10-CM

## 2010-12-11 DIAGNOSIS — Z0000 Encounter for general adult medical examination without abnormal findings: Secondary | ICD-10-CM | POA: Insufficient documentation

## 2010-12-11 LAB — CBC WITH DIFFERENTIAL/PLATELET
Basophils Absolute: 0 10*3/uL (ref 0.0–0.1)
Eosinophils Absolute: 0.2 10*3/uL (ref 0.0–0.7)
Lymphocytes Relative: 25.9 % (ref 12.0–46.0)
MCHC: 33.9 g/dL (ref 30.0–36.0)
Neutro Abs: 3 10*3/uL (ref 1.4–7.7)
Neutrophils Relative %: 57.5 % (ref 43.0–77.0)
Platelets: 177 10*3/uL (ref 150.0–400.0)
RDW: 13.8 % (ref 11.5–14.6)

## 2010-12-11 LAB — TSH: TSH: 3.39 u[IU]/mL (ref 0.35–5.50)

## 2010-12-11 NOTE — Assessment & Plan Note (Addendum)
Td 2002 and today pneumonia shot, 2006 had a shingles shot already  last OV w/  Gyn 2009, last Pap smear 2009 per patient -------> plans to see gyn this year last mammogram 5-11  negative  h/o several Cscopes, last colonoscopy that I have records was November 2002 (Dr. Westley Gambles), her GI is Dr Arlyce Dice per pt . Cscope was normal, recommended to  " repeat in 10 years" Plan: discuss w/ GI, ?cscope (given age cscope may not be neccessary) For now will give a iFOB  recommend patient to continue with her healthy lifestyle. Labs (unable to check FLP due to coding issues )

## 2010-12-11 NOTE — Progress Notes (Signed)
  Subjective:    Patient ID: Kimberly Rodriguez, female    DOB: [birth year], 77 y.o.   MRN: [id number]  HPI  Here for Medicare AWV:  1. Risk factors based on Past M, S, F history: reviewed 2. Physical Activities:  Home chores, occasionally takes fast-walks 3. Depression/mood:  No problems noted or reported  4. Hearing:   No problems noted or reported  5. ADL's:  Independent  6. Fall Risk: no recent falls  7. home Safety: does feel safe at home  8. Height, weight, &visual acuity: see VS, no problems w/ vision, sees eye doctor yearly 9. Counseling: provided 10. Labs ordered based on risk factors: if needed  11. Referral Coordination: if needed 12.  Care Plan, see assessment and plan  13.   Cognitive Assessment:motor skills and cognition appropriate for age   In addition, today we discussed the following: occ leg cramp at night  H/o DJD-- currently not an issue   Past Medical History  Diagnosis Date  . Osteoporosis   . Osteoarthritis     h/o back pain, sees ortho s/p shots  . Microhematuria     s/p eval by urology 2008: U/S, CT, cysto w/ neg Bx (dx w/ a renal cyst, see reports)    Past Surgical History  Procedure Date  . Appendectomy   . Abdominal hysterectomy     no oophorectomy per pt  . Tonsillectomy   . Dilation and curettage of uterus      Family History: colon ca--no breast ca--no MI-- ?F ?M DM--no  Social History: married no children Tobacco-- quit in the 34s ETOH-- socially    Review of Systems  Constitutional: Negative for fever, fatigue and unexpected weight change.  Respiratory: Negative for cough and shortness of breath.   Cardiovascular: Negative for chest pain and leg swelling.  Gastrointestinal: Negative for abdominal pain and blood in stool.  Genitourinary: Negative for dysuria and difficulty urinating. Urgency: occ urgency.       Objective:   Physical Exam  Constitutional: She is oriented to person, place, and time. She appears  well-developed. No distress.  HENT:  Head: Normocephalic and atraumatic.  Neck: No thyromegaly present.       Nl carotid pulse B  Cardiovascular: Normal rate, regular rhythm and normal heart sounds.   No murmur heard. Pulmonary/Chest: Effort normal and breath sounds normal. No respiratory distress. She has no wheezes. She has no rales.  Abdominal: Soft. Bowel sounds are normal. She exhibits no distension. There is no tenderness. There is no rebound and no guarding.  Musculoskeletal: She exhibits no edema.  Neurological: She is alert and oriented to person, place, and time.  Skin: Skin is warm and dry. She is not diaphoretic.  Psychiatric: She has a normal mood and affect. Her behavior is normal. Judgment and thought content normal.          Assessment & Plan:

## 2010-12-11 NOTE — Assessment & Plan Note (Signed)
No current issues

## 2010-12-11 NOTE — Assessment & Plan Note (Signed)
She took Fosamax or Actonel for many years  DEXA 2007 had a T score of -2.4  she took [place] for one year in 2008, she had to quit because  insurance issues DEXA 04-2009 T score -2.87  based on the deteriorated T score, she was prescribed reclast,had reclast #2 3-12, no s/e Plan: Ca, vit D, exercise DEXA 04-2011, if T score decrerasing  ?prolia

## 2010-12-11 NOTE — Patient Instructions (Signed)
OTC quinine Calcium and Vit D daily

## 2010-12-11 NOTE — Assessment & Plan Note (Signed)
Nocturnal leg cramps, rec OTC quinine

## 2010-12-16 ENCOUNTER — Other Ambulatory Visit: Payer: Self-pay | Admitting: Internal Medicine

## 2010-12-16 ENCOUNTER — Other Ambulatory Visit: Payer: PRIVATE HEALTH INSURANCE

## 2010-12-16 DIAGNOSIS — Z1211 Encounter for screening for malignant neoplasm of colon: Secondary | ICD-10-CM

## 2010-12-16 LAB — FECAL OCCULT BLOOD, IMMUNOCHEMICAL: Fecal Occult Bld: NEGATIVE

## 2010-12-18 ENCOUNTER — Encounter: Payer: Self-pay | Admitting: *Deleted

## 2011-04-01 LAB — POCT HEMOGLOBIN-HEMACUE
Hemoglobin: 14.5
Operator id: 118191

## 2011-05-14 ENCOUNTER — Telehealth: Payer: Self-pay | Admitting: Internal Medicine

## 2011-05-14 NOTE — Telephone Encounter (Signed)
Order entered

## 2011-05-19 ENCOUNTER — Ambulatory Visit
Admission: RE | Admit: 2011-05-19 | Discharge: 2011-05-19 | Disposition: A | Discharge status: Home / Self Care | Payer: PRIVATE HEALTH INSURANCE | Source: Ambulatory Visit | Attending: Internal Medicine | Admitting: Internal Medicine

## 2011-05-27 ENCOUNTER — Other Ambulatory Visit: Payer: PRIVATE HEALTH INSURANCE

## 2011-06-10 ENCOUNTER — Encounter: Payer: Self-pay | Admitting: Internal Medicine

## 2011-06-16 ENCOUNTER — Ambulatory Visit (INDEPENDENT_AMBULATORY_CARE_PROVIDER_SITE_OTHER): Payer: Medicare Other | Admitting: Internal Medicine

## 2011-06-16 ENCOUNTER — Encounter: Payer: Self-pay | Admitting: Internal Medicine

## 2011-06-16 VITALS — BP 100/60 | HR 66 | Temp 97.7°F | Wt 141.0 lb

## 2011-06-16 DIAGNOSIS — M810 Age-related osteoporosis without current pathological fracture: Secondary | ICD-10-CM

## 2011-06-16 MED ORDER — ALENDRONATE SODIUM 70 MG PO TABS: 70.0000 mg | ORAL_TABLET | ORAL | Status: AC

## 2011-06-16 NOTE — Progress Notes (Signed)
  Subjective:    Patient ID: Kimberly Rodriguez, female    DOB: December 02, 1926, 77 y.o.   MRN: [id number]  HPI Here to review DEXA results   Past Medical History  Diagnosis Date  . Osteoporosis   . Osteoarthritis     h/o back pain, sees ortho s/p shots  . Microhematuria     s/p eval by urology 2008: U/S, CT, cysto w/ neg Bx (dx w/ a renal cyst, see reports)   Past Surgical History  Procedure Date  . Appendectomy   . Abdominal hysterectomy     no oophorectomy per pt  . Tonsillectomy   . Dilation and curettage of uterus      Review of Systems Feels well, no dysphagia, GERD sx  Also c/o clogged feeling at the R ear    Objective:   Physical Exam  Alert oriented in no apparent distress. Left ear normal Right ear with dry wax, no discharge      Assessment & Plan:  Cerumen in patient: Recommend peroxide as needed and call for a nurse visit for lavage if symptoms are not improving   Today , I spent more than 15  min with the patient, >50% of the time counseling

## 2011-06-16 NOTE — Assessment & Plan Note (Addendum)
Latest DEXA 05-19-11 showed: Hip Tscore -2.3 (was -2.8 in 2010) L forearm T score was -3.1 Options discussed including prolia (printed material provided), forteo, biphosphonates , referral to a specialist. We also discussed the need to take calcium, vitamin D and remain physically active. Elected to go back to fosmax, precautions discussed DEXA in 2 year

## 2011-07-26 DIAGNOSIS — M2040 Other hammer toe(s) (acquired), unspecified foot: Secondary | ICD-10-CM | POA: Diagnosis not present

## 2011-07-26 DIAGNOSIS — Q72899 Other reduction defects of unspecified lower limb: Secondary | ICD-10-CM | POA: Diagnosis not present

## 2011-07-26 DIAGNOSIS — L851 Acquired keratosis [keratoderma] palmaris et plantaris: Secondary | ICD-10-CM | POA: Diagnosis not present

## 2011-07-26 DIAGNOSIS — B351 Tinea unguium: Secondary | ICD-10-CM | POA: Diagnosis not present

## 2011-09-01 ENCOUNTER — Encounter: Payer: Self-pay | Admitting: Internal Medicine

## 2011-09-22 ENCOUNTER — Other Ambulatory Visit: Payer: Self-pay | Admitting: Internal Medicine

## 2011-09-22 DIAGNOSIS — Z1231 Encounter for screening mammogram for malignant neoplasm of breast: Secondary | ICD-10-CM

## 2011-10-21 DIAGNOSIS — H0019 Chalazion unspecified eye, unspecified eyelid: Secondary | ICD-10-CM | POA: Diagnosis not present

## 2011-11-01 ENCOUNTER — Ambulatory Visit (HOSPITAL_BASED_OUTPATIENT_CLINIC_OR_DEPARTMENT_OTHER)
Admission: RE | Admit: 2011-11-01 | Discharge: 2011-11-01 | Disposition: A | Discharge status: Home / Self Care | Payer: Medicare Other | Source: Ambulatory Visit | Attending: Internal Medicine | Admitting: Internal Medicine

## 2011-11-01 DIAGNOSIS — Z1231 Encounter for screening mammogram for malignant neoplasm of breast: Secondary | ICD-10-CM | POA: Insufficient documentation

## 2011-11-02 DIAGNOSIS — H25019 Cortical age-related cataract, unspecified eye: Secondary | ICD-10-CM | POA: Diagnosis not present

## 2011-11-02 DIAGNOSIS — H524 Presbyopia: Secondary | ICD-10-CM | POA: Diagnosis not present

## 2011-11-02 DIAGNOSIS — H2510 Age-related nuclear cataract, unspecified eye: Secondary | ICD-10-CM | POA: Diagnosis not present

## 2012-01-28 ENCOUNTER — Encounter: Payer: Self-pay | Admitting: Internal Medicine

## 2012-02-22 DIAGNOSIS — H25019 Cortical age-related cataract, unspecified eye: Secondary | ICD-10-CM | POA: Diagnosis not present

## 2012-02-22 DIAGNOSIS — H2510 Age-related nuclear cataract, unspecified eye: Secondary | ICD-10-CM | POA: Diagnosis not present

## 2012-02-29 ENCOUNTER — Encounter: Payer: Self-pay | Admitting: Internal Medicine

## 2012-02-29 ENCOUNTER — Ambulatory Visit (INDEPENDENT_AMBULATORY_CARE_PROVIDER_SITE_OTHER): Payer: Medicare Other | Admitting: Internal Medicine

## 2012-02-29 VITALS — BP 142/74 | HR 62 | Temp 97.9°F | Ht 65.5 in | Wt 135.0 lb

## 2012-02-29 DIAGNOSIS — Z136 Encounter for screening for cardiovascular disorders: Secondary | ICD-10-CM

## 2012-02-29 DIAGNOSIS — Z0000 Encounter for general adult medical examination without abnormal findings: Secondary | ICD-10-CM | POA: Diagnosis not present

## 2012-02-29 DIAGNOSIS — M810 Age-related osteoporosis without current pathological fracture: Secondary | ICD-10-CM

## 2012-02-29 DIAGNOSIS — Z01818 Encounter for other preprocedural examination: Secondary | ICD-10-CM

## 2012-02-29 DIAGNOSIS — F411 Generalized anxiety disorder: Secondary | ICD-10-CM | POA: Diagnosis not present

## 2012-02-29 DIAGNOSIS — Z23 Encounter for immunization: Secondary | ICD-10-CM

## 2012-02-29 DIAGNOSIS — M1990 Unspecified osteoarthritis, unspecified site: Secondary | ICD-10-CM

## 2012-02-29 DIAGNOSIS — Z130 Encounter for screening for diseases of the blood and blood-forming organs and certain disorders involving the immune mechanism: Secondary | ICD-10-CM

## 2012-02-29 DIAGNOSIS — F419 Anxiety disorder, unspecified: Secondary | ICD-10-CM | POA: Insufficient documentation

## 2012-02-29 NOTE — Assessment & Plan Note (Signed)
Not a major issue at this time

## 2012-02-29 NOTE — Assessment & Plan Note (Signed)
Anxiety related to her husband being at a rehabilitation facility. They have no children, she has no family around, the only relative she has are few cousins in the [place] area. Patient is counseled, recommend to plan ahead, get a power of attorney etc.

## 2012-02-29 NOTE — Assessment & Plan Note (Addendum)
Td  2012, pneumonia shot, 2006, had a shingles shot already Recommend a flu shot this season  last OV w/  Gyn 2012 , status post a hysterectomy, no history of abnormal Pap smears. Plan: No further cervical cancer screening, she is very low risk last mammogram 09-2011  Negative; discuss further breast cancer screening next year  h/o several Cscopes, last colonoscopy that I have records was November 2002 (Dr. Westley Gambles), her GI is Dr Arlyce Dice per pt . Cscope was normal, recommended to  " repeat in 10 years". Has a GI appointment 03-2012 Unable to screen for anemia d/t a coding issue Needs an EKG before her cataract surgery, EKG today shows sinus bradycardia. Copy provided to the patient Diet and exercise discussed

## 2012-02-29 NOTE — Progress Notes (Signed)
  Subjective:    Patient ID: Kimberly Rodriguez, female    DOB: 07-14-1926, 78 y.o.   MRN: [id number]  HPI Here for Medicare AWV: 1. Risk factors based on Past M, S, F history: reviewed 2. Physical Activities:  Home chores, otherwise sedentary 3. Depression/mood:  see below    4. Hearing: decreased on the R x a while, exam confirmatory, no problems w/ normal conversation, ?refer to audiologist (prefers to wait). Denies tinnitus 5. ADL's:  Independent   6. Fall Risk: prevention discussed    7. home Safety: does feel safe at home   8. Height, weight, &visual acuity: see VS, no problems w/ vision, sees eye doctor yearly 9. Counseling: provided 10. Labs ordered based on risk factors: if needed   11. Referral Coordination: if needed 12.  Care Plan, see assessment and plan   13.   Cognitive Assessment:motor skills and cognition appropriate for age   In addition, today we discussed the following: Anxiety--her husband's health has deteriorated, he is at a rehab facility, patient is obviously upset and anxious about the situation. Some depression. Osteoporosis, good compliance with Fosamax DJD, minor aches and pains, mostly in the neck. Currently not a major issue for the patient.  Past Medical History  Diagnosis Date  . Osteoporosis   . Osteoarthritis     h/o back pain, sees ortho s/p shots  . Microhematuria     s/p eval by urology 2008: U/S, CT, cysto w/ neg Bx (dx w/ a renal cyst, see reports)   Past Surgical History  Procedure Date  . Appendectomy   . Abdominal hysterectomy     no oophorectomy per pt  . Tonsillectomy   . Dilation and curettage of uterus     Family History: colon ca--no breast ca--no MI-- ?F ?M DM--no  Social History: Married, husband @ rehab due to falls  no children Tobacco-- quit in the 27s ETOH-- socially   Diet-- healthy for the most part   Review of Systems No chest pain or shortness of breath No nausea, vomiting, diarrhea or blood in the  stools. No dysuria or gross hematuria.  Current Outpatient Rx  Name Route Sig Dispense Refill  . ALENDRONATE SODIUM 70 MG PO TABS Oral Take 1 tablet (70 mg total) by mouth every 7 (seven) days. Take with a full glass of water on an empty stomach. 4 tablet 11  . CALTRATE 600 PO Oral Take by mouth.      . FIBER PO Oral Take by mouth.      . CENTRUM SILVER PO CHEW Oral Chew by mouth.           Objective:   Physical Exam  General -- alert, well-developed, well-nourished.   Neck --no thyromegaly , normal carotid puls Lungs -- normal respiratory effort, no intercostal retractions, no accessory muscle use, and normal breath sounds.   Heart-- normal rate, regular rhythm, no murmur, and no gallop.   Abdomen--soft, non-tender, no distention, no masses, no HSM, no guarding, and no rigidity.  No bruit  Extremities-- no pretibial edema bilaterally Neurologic-- alert & oriented X3 , gait normal,strength normal in all extremities. Psych-- Cognition and judgment appear intact. Alert and cooperative with normal attention span and concentration. Patient was slightly emotional went to about her husband, tearful.     Assessment & Plan:

## 2012-02-29 NOTE — Assessment & Plan Note (Signed)
See previous entry, continue with Fosamax

## 2012-03-15 DIAGNOSIS — H25019 Cortical age-related cataract, unspecified eye: Secondary | ICD-10-CM | POA: Diagnosis not present

## 2012-03-15 DIAGNOSIS — H2510 Age-related nuclear cataract, unspecified eye: Secondary | ICD-10-CM | POA: Diagnosis not present

## 2012-03-15 DIAGNOSIS — H269 Unspecified cataract: Secondary | ICD-10-CM | POA: Diagnosis not present

## 2012-03-21 ENCOUNTER — Encounter: Payer: Self-pay | Admitting: Internal Medicine

## 2012-03-21 ENCOUNTER — Ambulatory Visit (INDEPENDENT_AMBULATORY_CARE_PROVIDER_SITE_OTHER): Payer: Medicare Other | Admitting: Internal Medicine

## 2012-03-21 VITALS — BP 134/60 | HR 64 | Ht 64.5 in | Wt 136.4 lb

## 2012-03-21 DIAGNOSIS — Z1211 Encounter for screening for malignant neoplasm of colon: Secondary | ICD-10-CM

## 2012-03-21 NOTE — Patient Instructions (Addendum)
Please follow up with Dr Juanda Chance as needed. CC: Dr Willow Ora

## 2012-03-21 NOTE — Progress Notes (Signed)
Kimberly Rodriguez 1926/07/05 MRN [id number]   History of Present Illness:  This is an 78 year old white female who is here to discuss having a recall colonoscopy. She denies any GI symptoms. Her last colonoscopy was in November 2002 and it was a difficult exam due to tortuosity of her colon, but the mucosa was normal and there were no polyps. She had a fecal occult blood test in June 2012 which was normal. She denies any rectal bleeding, abdominal pain or weight changes. There is no family history of colon cancer. She received a recall letter for colonoscopy where I indicated that an office visit was recommended to discuss risks and the benefits of colonoscopy given her age.   Past Medical History  Diagnosis Date  . Osteoporosis   . Osteoarthritis     h/o back pain, sees ortho s/p shots  . Microhematuria     s/p eval by urology 2008: U/S, CT, cysto w/ neg Bx (dx w/ a renal cyst, see reports)  . Cataract    Past Surgical History  Procedure Date  . Appendectomy   . Abdominal hysterectomy     no oophorectomy per pt  . Tonsillectomy   . Dilation and curettage of uterus     reports that she has quit smoking. She has never used smokeless tobacco. She reports that she drinks alcohol. She reports that she does not use illicit drugs. family history includes Heart disease in her father and mother.  There is no history of Colon cancer and Breast cancer. No Known Allergies      Review of Systems: Denies abdominal pain negative for shortness of breath dysphagia  The remainder of the 10 point ROS is negative except as outlined in H&P   Physical Exam: General appearance  Well developed, in no distress. Eyes- non icteric. HEENT nontraumatic, normocephalic. Mouth no lesions, tongue papillated, no cheilosis. Neck supple without adenopathy, thyroid not enlarged, no carotid bruits, no JVD. Lungs Clear to auscultation bilaterally. Cor normal S1, normal S2, regular rhythm, no murmur,  quiet  precordium. Abdomen: Soft abdomen nontender. Normoactive bowel sounds. Rectal: Formed Hemoccult negative stool Extremities no pedal edema. Skin no lesions. Neurological alert and oriented x 3. Psychological normal mood and affect.  Assessment and Plan:  Problem #17 77 year old white female in satisfactory health who is due for a recall colonoscopy. She had a rather difficult exam 10 years ago due to tortuosity of her colon and I suspect the same would apply if she had a repeat colonoscopy this time. We have discussed the benefits and the risks of colonoscopy at her age of 52. I suggested that we not pursue a routine colonoscopy exam because of risk of perforation given the tortuosity and redundancy of her colon 11 years ago. She agrees with my suggestions. I recommend stool Hemoccult yearly and her continuing on a high-fiber diet. If there any specific symptoms such as rectal bleeding or abdominal pain, we will be happy to see her.   03/21/2012 Lina Sar

## 2012-03-24 ENCOUNTER — Encounter: Payer: Self-pay | Admitting: Internal Medicine

## 2012-03-24 ENCOUNTER — Ambulatory Visit (INDEPENDENT_AMBULATORY_CARE_PROVIDER_SITE_OTHER): Payer: Medicare Other | Admitting: Internal Medicine

## 2012-03-24 VITALS — BP 122/64 | HR 74 | Temp 98.7°F | Wt 134.0 lb

## 2012-03-24 DIAGNOSIS — L0291 Cutaneous abscess, unspecified: Secondary | ICD-10-CM

## 2012-03-24 DIAGNOSIS — L0390 Cellulitis, unspecified: Secondary | ICD-10-CM

## 2012-03-24 MED ORDER — DOXYCYCLINE HYCLATE 100 MG PO TABS: 100.0000 mg | ORAL_TABLET | Freq: Two times a day (BID) | ORAL | Status: DC

## 2012-03-24 NOTE — Patient Instructions (Addendum)
Keep the leg elevated, apply ice twice a day Keep the area clean and dry, put an antibiotic ointment Start taking the antibiotic call doxycycline twice a day. If you develop fever, chills or the redness and swelling of the leg get worse you need to contact us or go to the ER Also call us next week if you don't see any improvement.

## 2012-03-24 NOTE — Progress Notes (Signed)
  Subjective:    Patient ID: Kimberly Rodriguez, female    DOB: 1926/09/26, 78 y.o.   MRN: [id number]  HPI Acute visit, she injured her right pretibial area last night at home, had a lot of pain and a very small amount of bleeding. She hit the leg w/ a suitcase, does not belive she had a deep injury or cut She is here today because she is still hurting.  Past Medical History  Diagnosis Date  . Osteoporosis   . Osteoarthritis     h/o back pain, sees ortho s/p shots  . Microhematuria     s/p eval by urology 2008: U/S, CT, cysto w/ neg Bx (dx w/ a renal cyst, see reports)  . Cataract    Past Surgical History  Procedure Date  . Appendectomy   . Abdominal hysterectomy     no oophorectomy per pt  . Tonsillectomy   . Dilation and curettage of uterus      Review of Systems Denies fever chills No nausea, vomiting or diarrhea.     Objective:   Physical Exam  Constitutional: She appears well-developed and well-nourished. No distress.  Musculoskeletal:       Legs: Skin: She is not diaphoretic.  Psychiatric: She has a normal mood and affect. Her behavior is normal. Judgment and thought content normal.          Assessment & Plan:  Right pretibial injury, no need for stitches or steri-strips, she however seems to be developing cellulitis. Plan:  Continue with local care, see instructions Start doxy Will check a XR if no better

## 2012-03-28 ENCOUNTER — Telehealth: Payer: Self-pay | Admitting: *Deleted

## 2012-03-28 NOTE — Telephone Encounter (Signed)
If she has fever, redness is worse or the swelling is not improving she needs to be rechecked. If the redness and swelling are better, we are in the right tract and she needs to continue with antibiotics and leg elevation.

## 2012-03-28 NOTE — Telephone Encounter (Signed)
Left detailed msg on pt's vmail.  

## 2012-03-28 NOTE — Telephone Encounter (Signed)
Called to check on pt to see how her leg was doing. Pt states it feels a little better but when she stands up after laying or sitting she doesn't feel like she has much circulation in her leg. Please advise.

## 2012-03-30 ENCOUNTER — Ambulatory Visit (INDEPENDENT_AMBULATORY_CARE_PROVIDER_SITE_OTHER): Payer: Medicare Other | Admitting: Internal Medicine

## 2012-03-30 ENCOUNTER — Ambulatory Visit (HOSPITAL_BASED_OUTPATIENT_CLINIC_OR_DEPARTMENT_OTHER)
Admission: RE | Admit: 2012-03-30 | Discharge: 2012-03-30 | Disposition: A | Discharge status: Home / Self Care | Payer: Medicare Other | Source: Ambulatory Visit | Attending: Internal Medicine | Admitting: Internal Medicine

## 2012-03-30 VITALS — BP 126/64 | HR 69 | Temp 98.0°F | Wt 136.6 lb

## 2012-03-30 DIAGNOSIS — S99929A Unspecified injury of unspecified foot, initial encounter: Secondary | ICD-10-CM | POA: Diagnosis not present

## 2012-03-30 DIAGNOSIS — S8990XA Unspecified injury of unspecified lower leg, initial encounter: Secondary | ICD-10-CM | POA: Diagnosis not present

## 2012-03-30 DIAGNOSIS — M7989 Other specified soft tissue disorders: Secondary | ICD-10-CM | POA: Diagnosis not present

## 2012-03-30 DIAGNOSIS — X58XXXA Exposure to other specified factors, initial encounter: Secondary | ICD-10-CM | POA: Insufficient documentation

## 2012-03-30 DIAGNOSIS — M810 Age-related osteoporosis without current pathological fracture: Secondary | ICD-10-CM | POA: Diagnosis not present

## 2012-03-30 NOTE — Telephone Encounter (Signed)
Pt is coming in today for an appt to see Dr. Alwyn Ren.

## 2012-03-30 NOTE — Patient Instructions (Addendum)
Use warm moist compresses to 3 times a day to the affected area.  Order for x-rays entered into  the computer; these will be performed at [facility]. No appointment is necessary.

## 2012-03-30 NOTE — Progress Notes (Signed)
  Subjective:    Patient ID: Kimberly Rodriguez, female    DOB: 11-19-1926, 78 y.o.   MRN: [id number]  HPI She fell 03/23/12 having tripped over a suitcase. She injured the right shin with associated blue/black discoloration. Subsequently she has developed erythema, edema and throbbing pain especially with weightbearing.  Dr. Drue Novel prescribed doxycycline because of the possible cellulitis.  Now she has pain with any weightbearing in this extremity      Review of Systems She denies fever, chills, fatigue, chest pain or SOB.     Objective:   Physical Exam   She appears healthy and well-nourished  There is a significant loss over the mid right anterior shin which is tender to touch. There is a subtle increase in temperature. There is mild erythema without frank cellulitis. This lesion is 6 x 4.5 cm. There is also a central eschar 7 x 2 mm.  There is 1/2+ edema at the ankle on the right.  Pedal pulses are intact  Homans sign is negative       Assessment & Plan:  #1 right tibial hematoma; because of the pain with weightbearing, shin films will be performed. Warm compresses rather than ice would be recommended.  #2 osteoporosis for which she's on bisphosphonate. This probably explais the significant bony changes post trauma.  She may have been on oral bisphosphonate for over 5 years. She may be a candidate for repeat  Reclast in view of her age

## 2012-04-13 DIAGNOSIS — H524 Presbyopia: Secondary | ICD-10-CM | POA: Diagnosis not present

## 2012-04-13 DIAGNOSIS — H5200 Hypermetropia, unspecified eye: Secondary | ICD-10-CM | POA: Diagnosis not present

## 2012-07-11 ENCOUNTER — Telehealth: Payer: Self-pay | Admitting: *Deleted

## 2012-07-11 MED ORDER — ALENDRONATE SODIUM 70 MG PO TABS: 70.0000 mg | ORAL_TABLET | ORAL | Status: DC

## 2012-07-11 NOTE — Telephone Encounter (Signed)
Not on pt's current med list. OK to refill?

## 2012-07-11 NOTE — Telephone Encounter (Signed)
Okay to refill 3 months and one refill.

## 2012-07-11 NOTE — Telephone Encounter (Signed)
Refill done, pt made aware.

## 2012-07-11 NOTE — Telephone Encounter (Signed)
Patient returned your call. She states she needs alendronate 70 mg sent to [facility]. She is no longer wanting this filled at [facility].

## 2012-07-11 NOTE — Telephone Encounter (Signed)
Pt left msg on triage vmail requesting a call back. Called pt. lmovm to return call.

## 2012-09-26 ENCOUNTER — Other Ambulatory Visit: Payer: Self-pay | Admitting: Internal Medicine

## 2012-09-26 NOTE — Telephone Encounter (Signed)
Per Dr. Drue Novel refill denied. Pt needs to make an appt & due for labs.  Pt made aware.

## 2012-09-27 ENCOUNTER — Other Ambulatory Visit: Payer: Self-pay | Admitting: Internal Medicine

## 2012-09-27 DIAGNOSIS — Z1231 Encounter for screening mammogram for malignant neoplasm of breast: Secondary | ICD-10-CM

## 2012-09-28 ENCOUNTER — Ambulatory Visit (INDEPENDENT_AMBULATORY_CARE_PROVIDER_SITE_OTHER): Payer: Medicare Other | Admitting: Internal Medicine

## 2012-09-28 ENCOUNTER — Encounter: Payer: Self-pay | Admitting: Internal Medicine

## 2012-09-28 VITALS — BP 122/68 | HR 61 | Temp 97.9°F | Wt 141.0 lb

## 2012-09-28 DIAGNOSIS — M810 Age-related osteoporosis without current pathological fracture: Secondary | ICD-10-CM

## 2012-09-28 DIAGNOSIS — F419 Anxiety disorder, unspecified: Secondary | ICD-10-CM

## 2012-09-28 DIAGNOSIS — F411 Generalized anxiety disorder: Secondary | ICD-10-CM

## 2012-09-28 DIAGNOSIS — M1990 Unspecified osteoarthritis, unspecified site: Secondary | ICD-10-CM

## 2012-09-28 LAB — BASIC METABOLIC PANEL
Calcium: 8.9 mg/dL (ref 8.4–10.5)
GFR: 99.41 mL/min (ref >60.00)
Sodium: 142 mEq/L (ref 135–145)

## 2012-09-28 NOTE — Assessment & Plan Note (Signed)
Counseled today about her husband. She is fortunately doing okay

## 2012-09-28 NOTE — Assessment & Plan Note (Signed)
Occasional neck pain, otherwise not an issue.

## 2012-09-28 NOTE — Assessment & Plan Note (Signed)
Good compliance with medication, no apparent side effects. Labs

## 2012-09-28 NOTE — Progress Notes (Signed)
  Subjective:    Patient ID: Kimberly Rodriguez, female    DOB: [birth year], 79 y.o.   MRN: [id number]  HPI ROV Anxiety-- husband is ill, at a NH but dealing ok w/ the situation osteporosis-- good med compliance w/ fosamax, ca and vit D  Past Medical History  Diagnosis Date  . Osteoporosis   . Osteoarthritis     h/o back pain, sees ortho s/p shots  . Microhematuria     s/p eval by urology 2008: U/S, CT, cysto w/ neg Bx (dx w/ a renal cyst, see reports)  . Cataract    Past Surgical History  Procedure Laterality Date  . Appendectomy    . Abdominal hysterectomy      no oophorectomy per pt  . Tonsillectomy    . Dilation and curettage of uterus     History   Social History  . Marital Status: Married    Spouse Name: N/A    Number of Children: 0  . Years of Education: N/A   Occupational History  . retired    Social History Main Topics  . Smoking status: Former Games developer  . Smokeless tobacco: Never Used  . Alcohol Use: Yes     Comment: socially -wine  . Drug Use: No  . Sexually Active: Not on file   Other Topics Concern  . Not on file   Social History Narrative  . No narrative on file     Review of Systems No CP SOB Denies nausea, vomiting, diarrhea    Objective:   Physical Exam General -- alert, well-developed, no distress. Lungs -- normal respiratory effort, no intercostal retractions, no accessory muscle use, and normal breath sounds.   Heart-- normal rate, regular rhythm, no murmur, and no gallop.   Extremities-- no pretibial edema bilaterally Neurologic-- alert & oriented X3 and strength normal in all extremities. Psych-- Cognition and judgment appear intact. Alert and cooperative with normal attention span and concentration.  not anxious appearing and not depressed appearing.      Assessment & Plan:

## 2012-09-29 ENCOUNTER — Telehealth: Payer: Self-pay | Admitting: *Deleted

## 2012-09-29 MED ORDER — ALENDRONATE SODIUM 70 MG PO TABS: 70.0000 mg | ORAL_TABLET | ORAL | Status: DC

## 2012-09-29 NOTE — Telephone Encounter (Signed)
Wanda Plump, MD - she needed a RF on fosamax, ok x 1 year   Refill done.

## 2012-10-02 ENCOUNTER — Encounter: Payer: Self-pay | Admitting: *Deleted

## 2012-11-02 ENCOUNTER — Ambulatory Visit (HOSPITAL_BASED_OUTPATIENT_CLINIC_OR_DEPARTMENT_OTHER)
Admission: RE | Admit: 2012-11-02 | Discharge: 2012-11-02 | Disposition: A | Discharge status: Home / Self Care | Payer: Medicare Other | Source: Ambulatory Visit | Attending: Internal Medicine | Admitting: Internal Medicine

## 2012-11-02 ENCOUNTER — Inpatient Hospital Stay (HOSPITAL_BASED_OUTPATIENT_CLINIC_OR_DEPARTMENT_OTHER): Admission: RE | Admit: 2012-11-02 | Payer: Medicare Other | Source: Ambulatory Visit

## 2012-11-02 DIAGNOSIS — Z1231 Encounter for screening mammogram for malignant neoplasm of breast: Secondary | ICD-10-CM | POA: Diagnosis not present

## 2013-03-16 DIAGNOSIS — Z23 Encounter for immunization: Secondary | ICD-10-CM | POA: Diagnosis not present

## 2013-04-24 DIAGNOSIS — H25019 Cortical age-related cataract, unspecified eye: Secondary | ICD-10-CM | POA: Diagnosis not present

## 2013-04-24 DIAGNOSIS — H18419 Arcus senilis, unspecified eye: Secondary | ICD-10-CM | POA: Diagnosis not present

## 2013-04-24 DIAGNOSIS — H2510 Age-related nuclear cataract, unspecified eye: Secondary | ICD-10-CM | POA: Diagnosis not present

## 2013-05-31 DIAGNOSIS — J019 Acute sinusitis, unspecified: Secondary | ICD-10-CM | POA: Diagnosis not present

## 2013-07-26 IMAGING — CR DG TIBIA/FIBULA 2V*R*
4 series · 4 of 4 positions shown · non-contrast
Comparison: None available.

CLINICAL DATA: Trauma to the right tibia and fibula.  Pain and
swelling.  Osteoporosis.

RIGHT TIBIA AND FIBULA - 2 VIEW

[t tib/fib ap right (1 of 2)]
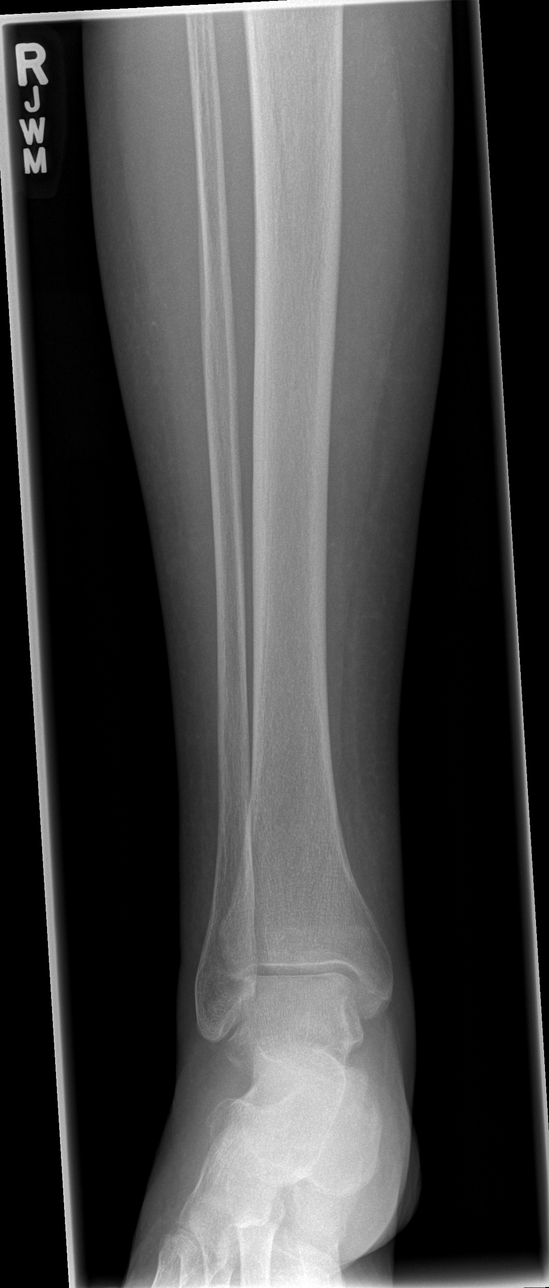

[t tib/fib ap right (2 of 2)]
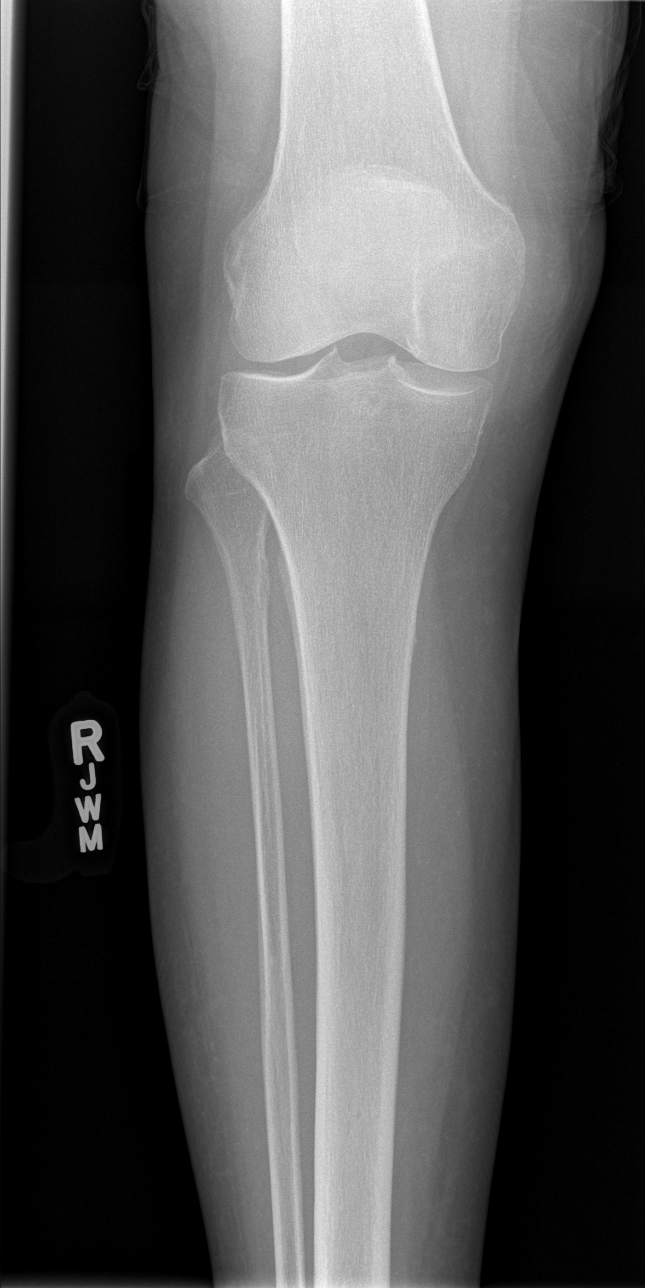

[t tib/fib lat right (1 of 2)]
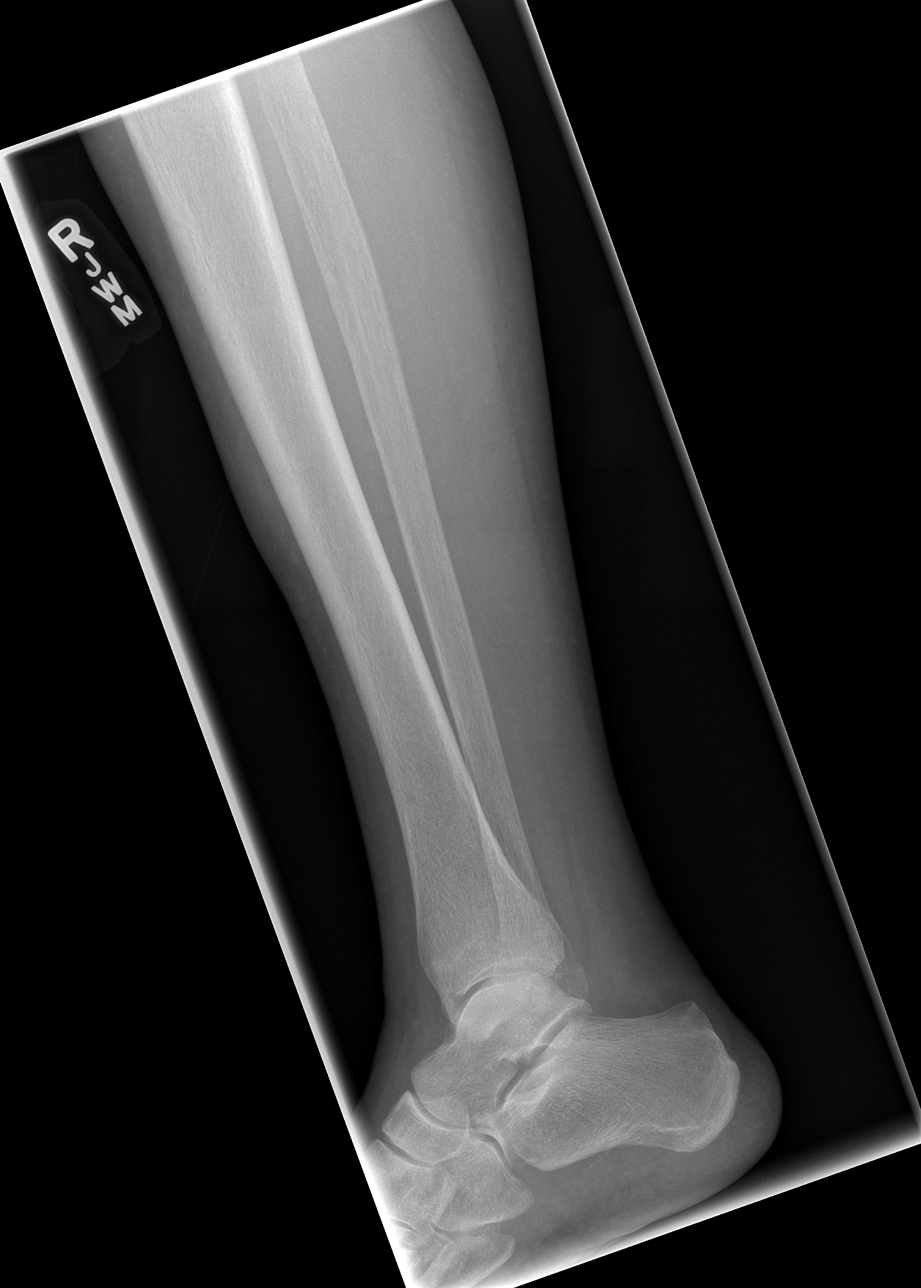

[t tib/fib lat right (2 of 2)]
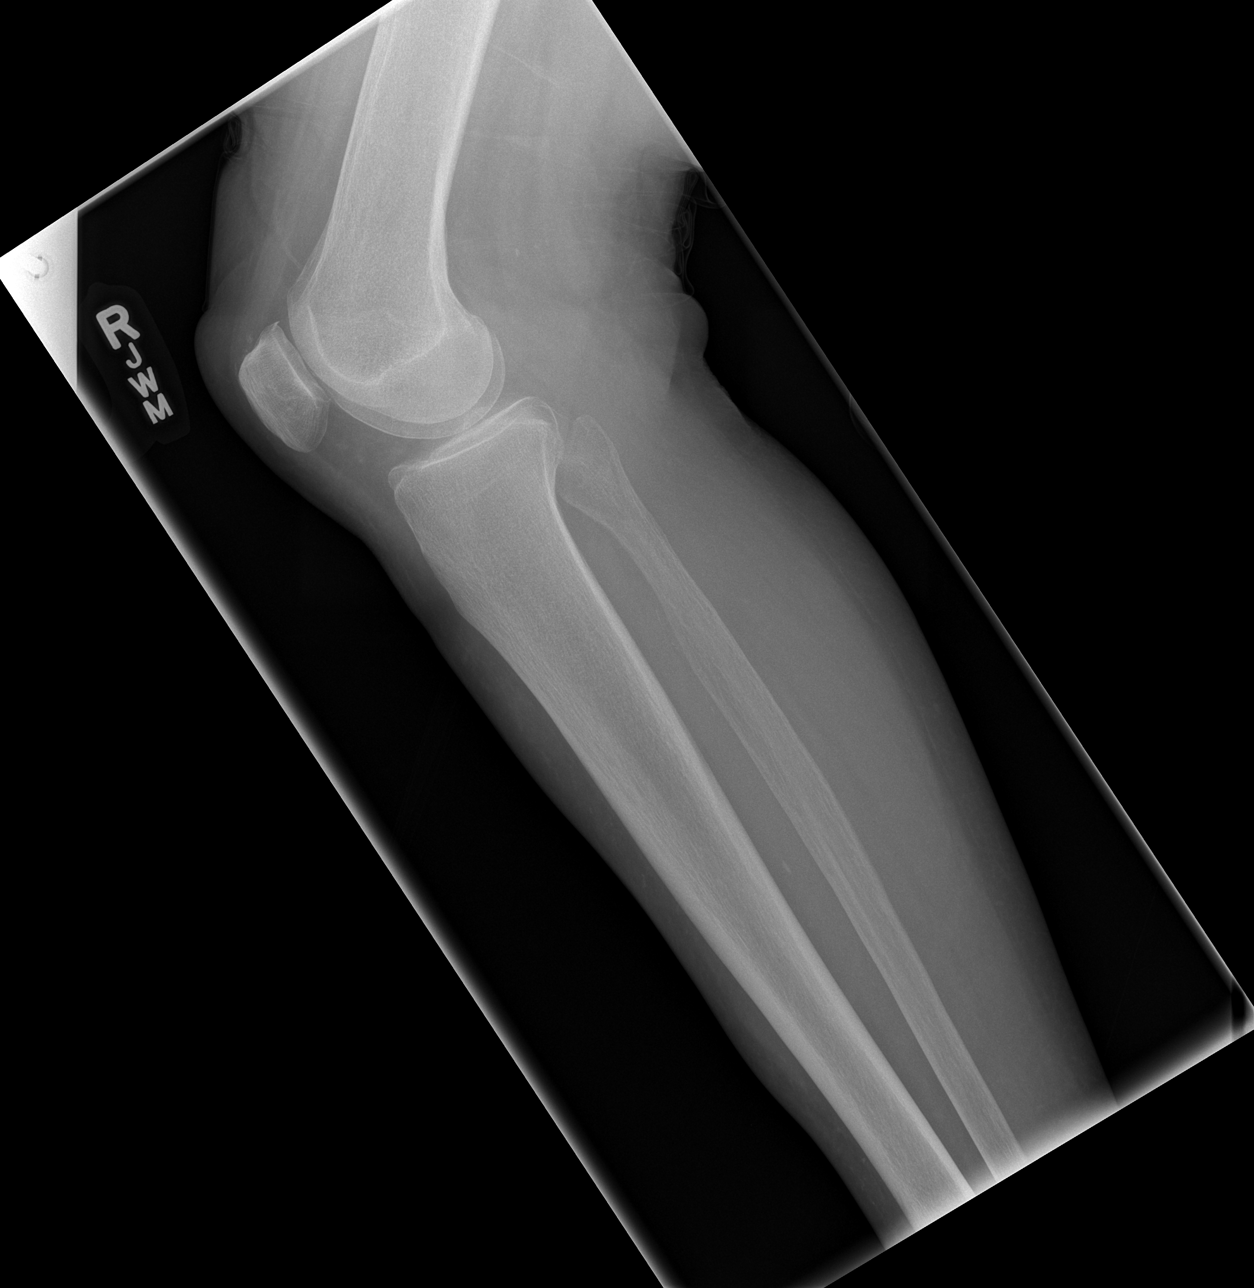

[4 of 4 positions shown; findings below may reference images not displayed]

FINDINGS: Anterior soft tissue swelling is noted along the mid
tibia.  There is no underlying fracture.  The knee and ankle joints
are located.  Mild degenerative changes are noted in the knee.
IMPRESSION: Soft tissue swelling anterior to the tibia without an underlying
fracture.

## 2013-09-26 ENCOUNTER — Other Ambulatory Visit: Payer: Self-pay | Admitting: Internal Medicine

## 2013-09-26 DIAGNOSIS — Z1231 Encounter for screening mammogram for malignant neoplasm of breast: Secondary | ICD-10-CM

## 2013-10-12 DIAGNOSIS — D239 Other benign neoplasm of skin, unspecified: Secondary | ICD-10-CM | POA: Diagnosis not present

## 2013-10-12 DIAGNOSIS — L821 Other seborrheic keratosis: Secondary | ICD-10-CM | POA: Diagnosis not present

## 2013-10-12 DIAGNOSIS — D2370 Other benign neoplasm of skin of unspecified lower limb, including hip: Secondary | ICD-10-CM | POA: Diagnosis not present

## 2013-10-23 DIAGNOSIS — L578 Other skin changes due to chronic exposure to nonionizing radiation: Secondary | ICD-10-CM | POA: Diagnosis not present

## 2013-11-07 ENCOUNTER — Ambulatory Visit (HOSPITAL_BASED_OUTPATIENT_CLINIC_OR_DEPARTMENT_OTHER)
Admission: RE | Admit: 2013-11-07 | Discharge: 2013-11-07 | Disposition: A | Discharge status: Home / Self Care | Payer: Medicare Other | Source: Ambulatory Visit | Attending: Internal Medicine | Admitting: Internal Medicine

## 2013-11-07 DIAGNOSIS — Z1231 Encounter for screening mammogram for malignant neoplasm of breast: Secondary | ICD-10-CM | POA: Diagnosis not present

## 2013-11-27 ENCOUNTER — Other Ambulatory Visit: Payer: Self-pay | Admitting: Internal Medicine

## 2013-12-28 ENCOUNTER — Encounter: Payer: Self-pay | Admitting: Nurse Practitioner

## 2013-12-28 ENCOUNTER — Telehealth: Payer: Self-pay

## 2013-12-28 ENCOUNTER — Ambulatory Visit (INDEPENDENT_AMBULATORY_CARE_PROVIDER_SITE_OTHER): Payer: Medicare Other | Admitting: Nurse Practitioner

## 2013-12-28 VITALS — BP 150/74 | HR 68 | Temp 97.7°F | Ht 65.5 in | Wt 157.2 lb

## 2013-12-28 DIAGNOSIS — L255 Unspecified contact dermatitis due to plants, except food: Secondary | ICD-10-CM | POA: Diagnosis not present

## 2013-12-28 MED ORDER — METHYLPREDNISOLONE ACETATE 40 MG/ML IJ SUSP
40.0000 mg | Freq: Once | INTRAMUSCULAR | Status: AC
  Administered 2013-12-28: 40 mg via INTRAMUSCULAR

## 2013-12-28 MED ORDER — PREDNISONE 10 MG PO TABS: ORAL_TABLET | ORAL | Status: DC

## 2013-12-28 NOTE — Patient Instructions (Signed)
Call opthalmologist today to schedule appt. For next week.  Start prednisone tomorrow. For itching, use ice pack and/or benadryl cream.  Wash with mild soap-Dove bar soap daily.   Return in 2 weeks for re-eval or sooner if new symptoms. This will take minimum of 2 weeks to go away.  Poison [employer] ivy is a inflammation of the skin (contact dermatitis) caused by touching the allergens on the leaves of the ivy plant following previous exposure to the plant. The rash usually appears 48 hours after exposure. The rash is usually bumps (papules) or blisters (vesicles) in a linear pattern. Depending on your own sensitivity, the rash may simply cause redness and itching, or it may also progress to blisters which may break open. These must be well cared for to prevent secondary bacterial (germ) infection, followed by scarring. Keep any open areas dry, clean, dressed, and covered with an antibacterial ointment if needed. The eyes may also get puffy. The puffiness is worst in the morning and gets better as the day progresses. This dermatitis usually heals without scarring, within 2 to 3 weeks without treatment. HOME CARE INSTRUCTIONS  Thoroughly wash with soap and water as soon as you have been exposed to poison ivy. You have about one half hour to remove the plant resin before it will cause the rash. This washing will destroy the oil or antigen on the skin that is causing, or will cause, the rash. Be sure to wash under your fingernails as any plant resin there will continue to spread the rash. Do not rub skin vigorously when washing affected area. Poison ivy cannot spread if no oil from the plant remains on your body. A rash that has progressed to weeping sores will not spread the rash unless you have not washed thoroughly. It is also important to wash any clothes you have been wearing as these may carry active allergens. The rash will return if you wear the unwashed clothing, even several days  later. Avoidance of the plant in the future is the best measure. Poison ivy plant can be recognized by the number of leaves. Generally, poison ivy has three leaves with flowering branches on a single stem. Diphenhydramine may be purchased over the counter and used as needed for itching. Do not drive with this medication if it makes you drowsy.Ask your caregiver about medication for children. SEEK MEDICAL CARE IF:  Open sores develop.  Redness spreads beyond area of rash.  You notice purulent (pus-like) discharge.  You have increased pain.  Other signs of infection develop (such as fever). Document Released: 06/04/2000 Document Revised: 08/30/2011 Document Reviewed: 04/23/2009 [facility] Patient Information 2015 [place], [place]. This information is not intended to replace advice given to you by your health care provider. Make sure you discuss any questions you have with your health care provider.

## 2013-12-28 NOTE — Progress Notes (Signed)
   Subjective:    Patient ID: Kimberly Rodriguez, female    DOB: 05-28-1927, 80 y.o.   MRN: [id number]  Rash This is a new problem. The current episode started yesterday. The problem has been gradually worsening since onset. The affected locations include the face and neck. The rash is characterized by blistering, itchiness, redness and swelling. She was exposed to plant contact. Associated symptoms include facial edema. Pertinent negatives include no cough, diarrhea, eye pain, fatigue, fever, joint pain, shortness of breath, sore throat or vomiting. Past treatments include nothing.      Review of Systems  Constitutional: Negative for fever, chills, activity change and fatigue.  HENT: Positive for facial swelling. Negative for postnasal drip, sinus pressure, sneezing, sore throat, trouble swallowing and voice change.   Eyes: Positive for itching. Negative for pain, discharge, redness and visual disturbance.       Lids swollen  Respiratory: Negative for cough, chest tightness, shortness of breath and wheezing.   Cardiovascular: Negative for chest pain and palpitations.  Gastrointestinal: Negative for vomiting, abdominal pain and diarrhea.  Musculoskeletal: Negative for joint pain.  Skin: Positive for rash.  Neurological: Negative for dizziness and headaches.       Objective:   Physical Exam  Vitals reviewed. Constitutional: She is oriented to person, place, and time. She appears well-developed and well-nourished. No distress.  HENT:  Head: Normocephalic and atraumatic.    Mouth/Throat: Oropharynx is clear and moist. No oropharyngeal exudate.  Eyes: Conjunctivae and EOM are normal. Pupils are equal, round, and reactive to light. Right eye exhibits no discharge and no exudate. Left eye exhibits no discharge and no exudate. Right conjunctiva is not injected. Right conjunctiva has no hemorrhage. Left conjunctiva is not injected. Left conjunctiva has no hemorrhage.    Neck: Normal range of  motion.  Cardiovascular: Normal rate, regular rhythm and normal heart sounds.   No murmur heard. Pulmonary/Chest: Effort normal and breath sounds normal. No respiratory distress. She has no wheezes. She has no rales.  Lymphadenopathy:    She has no cervical adenopathy.  Neurological: She is alert and oriented to person, place, and time.  Skin: Skin is warm and dry. Rash noted.  Psychiatric: She has a normal mood and affect. Her behavior is normal. Thought content normal.          Assessment & Plan:  1. Plant dermatitis Eyelids affected - methylPREDNISolone acetate (DEPO-MEDROL) injection 40 mg; Inject 1 mL (40 mg total) into the muscle once. - predniSONE (DELTASONE) 10 MG tablet; Starting tomorrow, Take 4Tpo qam X 3d, then 3T po qam X 4d, then 2T po qd X 3d, then 1T po qam X 3d.  Dispense: 33 tablet; Refill: 0 See pt instructions. F/u in 2 weeks or sooner if new symptoms. See opthalmologist next week.

## 2013-12-28 NOTE — Progress Notes (Signed)
Pre visit review using our clinic review tool, if applicable. No additional management support is needed unless otherwise documented below in the visit note. 

## 2013-12-28 NOTE — Telephone Encounter (Signed)
Called in Rx to [facility]. We sent it into the [facility] by mistake.

## 2013-12-31 DIAGNOSIS — L255 Unspecified contact dermatitis due to plants, except food: Secondary | ICD-10-CM | POA: Diagnosis not present

## 2014-01-08 ENCOUNTER — Ambulatory Visit (INDEPENDENT_AMBULATORY_CARE_PROVIDER_SITE_OTHER): Payer: Medicare Other | Admitting: Internal Medicine

## 2014-01-08 ENCOUNTER — Encounter: Payer: Self-pay | Admitting: Internal Medicine

## 2014-01-08 VITALS — BP 110/60 | HR 100 | Temp 97.8°F | Wt 155.0 lb

## 2014-01-08 DIAGNOSIS — L255 Unspecified contact dermatitis due to plants, except food: Secondary | ICD-10-CM

## 2014-01-08 NOTE — Patient Instructions (Signed)
Next visit is for a physical exam by October 2015,  fasting Please make an appointment

## 2014-01-08 NOTE — Progress Notes (Signed)
   Subjective:    Patient ID: Kimberly Rodriguez, female    DOB: 03/23/1927, 80 y.o.   MRN: [id number]  DOS:  01/08/2014 Type of visit - description: f/u History: Was seen approximately 10 days ago with contact dermatitis in the face, was prescribed prednisone, saw the eye doctor and the eyes were okay. Much improved, has some residual redness on the right arm.      Past Medical History  Diagnosis Date  . Osteoporosis   . Osteoarthritis     h/o back pain, sees ortho s/p shots  . Microhematuria     s/p eval by urology 2008: U/S, CT, cysto w/ neg Bx (dx w/ a renal cyst, see reports)  . Cataract     Past Surgical History  Procedure Laterality Date  . Appendectomy    . Abdominal hysterectomy      no oophorectomy per pt  . Tonsillectomy    . Dilation and curettage of uterus      History   Social History  . Marital Status: Married    Spouse Name: N/A    Number of Children: 0  . Years of Education: N/A   Occupational History  . retired    Social History Main Topics  . Smoking status: Former Research scientist (life sciences)  . Smokeless tobacco: Never Used  . Alcohol Use: Yes     Comment: socially -wine  . Drug Use: No  . Sexual Activity: Not on file   Other Topics Concern  . Not on file   Social History Narrative  . No narrative on file        Medication List       This list is accurate as of: 01/08/14  6:10 PM.  Always use your most recent med list.               alendronate 70 MG tablet  Commonly known as:  FOSAMAX  Take 1 tablet (70 mg total) by mouth every 7 (seven)  days. Take with a full  glass of water on an empty  stomach.     CALTRATE 600 PO  Take by mouth.     CENTRUM SILVER Chew  Chew by mouth.     FIBER PO  Take by mouth.           Objective:   Physical Exam BP 110/60  Pulse 100  Temp(Src) 97.8 F (36.6 C)  Wt 155 lb (70.308 kg)  SpO2 93% General -- alert, well-developed, NAD.  HEENT--  Face and neck: Skin normal. Right arm, inner aspect has an  area that is slightly red but not warm or tender. (Area is much better compared to 10 days ago per patient) Neurologic--  alert & oriented X3. Speech normal, gait appropriate for age, strength symmetric and appropriate for age.  Psych-- Cognition and judgment appear intact. Cooperative with normal attention span and concentration. No anxious or depressed appearing.     Assessment & Plan:    Contact dermatitis, Essentially resolved. Recommend   Avoidance of triggers.

## 2014-03-10 ENCOUNTER — Other Ambulatory Visit: Payer: Self-pay | Admitting: Internal Medicine

## 2014-03-27 ENCOUNTER — Ambulatory Visit (INDEPENDENT_AMBULATORY_CARE_PROVIDER_SITE_OTHER): Payer: Medicare Other | Admitting: Internal Medicine

## 2014-03-27 ENCOUNTER — Encounter: Payer: Self-pay | Admitting: Internal Medicine

## 2014-03-27 VITALS — BP 137/76 | HR 71 | Temp 98.2°F | Ht 65.0 in | Wt 156.0 lb

## 2014-03-27 DIAGNOSIS — Z23 Encounter for immunization: Secondary | ICD-10-CM

## 2014-03-27 DIAGNOSIS — Z130 Encounter for screening for diseases of the blood and blood-forming organs and certain disorders involving the immune mechanism: Secondary | ICD-10-CM

## 2014-03-27 DIAGNOSIS — M150 Primary generalized (osteo)arthritis: Secondary | ICD-10-CM

## 2014-03-27 DIAGNOSIS — M810 Age-related osteoporosis without current pathological fracture: Secondary | ICD-10-CM | POA: Diagnosis not present

## 2014-03-27 DIAGNOSIS — Z0000 Encounter for general adult medical examination without abnormal findings: Secondary | ICD-10-CM | POA: Diagnosis not present

## 2014-03-27 DIAGNOSIS — Z13220 Encounter for screening for lipoid disorders: Secondary | ICD-10-CM

## 2014-03-27 DIAGNOSIS — E785 Hyperlipidemia, unspecified: Secondary | ICD-10-CM

## 2014-03-27 DIAGNOSIS — R7989 Other specified abnormal findings of blood chemistry: Secondary | ICD-10-CM | POA: Diagnosis not present

## 2014-03-27 DIAGNOSIS — M159 Polyosteoarthritis, unspecified: Secondary | ICD-10-CM

## 2014-03-27 LAB — CBC WITH DIFFERENTIAL/PLATELET
BASOS ABS: 0 10*3/uL (ref 0.0–0.1)
Basophils Relative: 0.5 % (ref 0.0–3.0)
EOS ABS: 0.1 10*3/uL (ref 0.0–0.7)
Eosinophils Relative: 1.6 % (ref 0.0–5.0)
HEMATOCRIT: 41.6 % (ref 36.0–46.0)
HEMOGLOBIN: 13.5 g/dL (ref 12.0–15.0)
LYMPHS ABS: 0.9 10*3/uL (ref 0.7–4.0)
LYMPHS PCT: 15.9 % (ref 12.0–46.0)
MCHC: 32.5 g/dL (ref 30.0–36.0)
MCV: 94.2 fl (ref 78.0–100.0)
Monocytes Absolute: 0.6 10*3/uL (ref 0.1–1.0)
Monocytes Relative: 11 % (ref 3.0–12.0)
NEUTROS ABS: 3.9 10*3/uL (ref 1.4–7.7)
Neutrophils Relative %: 71 % (ref 43.0–77.0)
Platelets: 208 10*3/uL (ref 150.0–400.0)
RBC: 4.42 Mil/uL (ref 3.87–5.11)
RDW: 15.1 % (ref 11.5–15.5)
WBC: 5.4 10*3/uL (ref 4.0–10.5)

## 2014-03-27 LAB — LIPID PANEL
CHOLESTEROL: 202 mg/dL — AB (ref 0–200)
HDL: 74.3 mg/dL (ref >39.00)
LDL Cholesterol: 116 mg/dL — ABNORMAL HIGH (ref 0–99)
NonHDL: 127.7
TRIGLYCERIDES: 59 mg/dL (ref 0.0–149.0)
Total CHOL/HDL Ratio: 3
VLDL: 11.8 mg/dL (ref 0.0–40.0)

## 2014-03-27 LAB — BASIC METABOLIC PANEL
BUN: 13 mg/dL (ref 6–23)
CALCIUM: 9.8 mg/dL (ref 8.4–10.5)
CO2: 25 mEq/L (ref 19–32)
CREATININE: 0.8 mg/dL (ref 0.4–1.2)
Chloride: 113 mEq/L — ABNORMAL HIGH (ref 96–112)
GFR: 68.48 mL/min (ref >60.00)
Glucose, Bld: 90 mg/dL (ref 70–99)
Potassium: 4.4 mEq/L (ref 3.5–5.1)
Sodium: 151 mEq/L — ABNORMAL HIGH (ref 135–145)

## 2014-03-27 NOTE — Assessment & Plan Note (Signed)
On OTCs as needed, recommend to avoid excessive Motrin or Motrin-like meds

## 2014-03-27 NOTE — Assessment & Plan Note (Addendum)
Good compliance w/ medication, check a bone density test. Further advice w/ results

## 2014-03-27 NOTE — Assessment & Plan Note (Addendum)
Td  2012,  pneumonia shot, 2006 prevnar-- today Had a shingles shot already  flu shot -- elected the regular dose  No further cervical cancer screening, she is very low risk, see previous entry last mammogram 5-15  Negative, rec no further breast ca screening base on most medical societies statements  h/o several Cscopes, last colonoscopy that I have records was November 2002 (Dr. Johna Roles), saw GI 2013, they agreed on no further cscope but iFOB (one provided today)   Diet and exercise discussed Last cholesterol was 217, slightly elevated, check FLP last CBC show increased monocytes (abnormal) --check a CBC End-of-life discussed, she does have a health care will, encouraged to get [place] a copy

## 2014-03-27 NOTE — Progress Notes (Signed)
Pre visit review using our clinic review tool, if applicable. No additional management support is needed unless otherwise documented below in the visit note. 

## 2014-03-27 NOTE — Patient Instructions (Signed)
Get your blood work before you leave     Please come back to the office in 1 year for a physical exam. Come back fasting     REMINDERS   If we are ordering labs, XRs or referring you to a specialist : we will always communicate to you the results or the time of the appointment within few days. If you don't hear from [place] please call the office   Fall Prevention and Home Safety Falls cause injuries and can affect all age groups. It is possible to use preventive measures to significantly decrease the likelihood of falls. There are many simple measures which can make your home safer and prevent falls. OUTDOORS  Repair cracks and edges of walkways and driveways.  Remove high doorway thresholds.  Trim shrubbery on the main path into your home.  Have good outside lighting.  Clear walkways of tools, rocks, debris, and clutter.  Check that handrails are not broken and are securely fastened. Both sides of steps should have handrails.  Have leaves, snow, and ice cleared regularly.  Use sand or salt on walkways during winter months.  In the garage, clean up grease or oil spills. BATHROOM  Install night lights.  Install grab bars by the toilet and in the tub and shower.  Use non-skid mats or decals in the tub or shower.  Place a plastic non-slip stool in the shower to sit on, if needed.  Keep floors dry and clean up all water on the floor immediately.  Remove soap buildup in the tub or shower on a regular basis.  Secure bath mats with non-slip, double-sided rug tape.  Remove throw rugs and tripping hazards from the floors. BEDROOMS  Install night lights.  Make sure a bedside light is easy to reach.  Do not use oversized bedding.  Keep a telephone by your bedside.  Have a firm chair with side arms to use for getting dressed.  Remove throw rugs and tripping hazards from the floor. KITCHEN  Keep handles on pots and pans turned toward the center of the stove. Use back  burners when possible.  Clean up spills quickly and allow time for drying.  Avoid walking on wet floors.  Avoid hot utensils and knives.  Position shelves so they are not too high or low.  Place commonly used objects within easy reach.  If necessary, use a sturdy step stool with a grab bar when reaching.  Keep electrical cables out of the way.  Do not use floor polish or wax that makes floors slippery. If you must use wax, use non-skid floor wax.  Remove throw rugs and tripping hazards from the floor. STAIRWAYS  Never leave objects on stairs.  Place handrails on both sides of stairways and use them. Fix any loose handrails. Make sure handrails on both sides of the stairways are as long as the stairs.  Check carpeting to make sure it is firmly attached along stairs. Make repairs to worn or loose carpet promptly.  Avoid placing throw rugs at the top or bottom of stairways, or properly secure the rug with carpet tape to prevent slippage. Get rid of throw rugs, if possible.  Have an electrician put in a light switch at the top and bottom of the stairs. OTHER FALL PREVENTION TIPS  Wear low-heel or rubber-soled shoes that are supportive and fit well. Wear closed toe shoes.  When using a stepladder, make sure it is fully opened and both spreaders are firmly locked. Do not  climb a closed stepladder.  Add color or contrast paint or tape to grab bars and handrails in your home. Place contrasting color strips on first and last steps.  Learn and use mobility aids as needed. Install an electrical emergency response system.  Turn on lights to avoid dark areas. Replace light bulbs that burn out immediately. Get light switches that glow.  Arrange furniture to create clear pathways. Keep furniture in the same place.  Firmly attach carpet with non-skid or double-sided tape.  Eliminate uneven floor surfaces.  Select a carpet pattern that does not visually hide the edge of steps.  Be  aware of all pets. OTHER HOME SAFETY TIPS  Set the water temperature for 120 F (48.8 C).  Keep emergency numbers on or near the telephone.  Keep smoke detectors on every level of the home and near sleeping areas. Document Released: 05/28/2002 Document Revised: 12/07/2011 Document Reviewed: 08/27/2011 [facility] Patient Information 2015 [place], [place]. This information is not intended to replace advice given to you by your health care provider. Make sure you discuss any questions you have with your health care provider.

## 2014-03-27 NOTE — Progress Notes (Signed)
Subjective:    Patient ID: Kimberly Rodriguez, female    DOB: Mar 14, 1927, 80 y.o.   MRN: [id number]  DOS:  03/27/2014 Type of visit - description :   Here for Medicare AWV: 1. Risk factors based on Past M, S, F history: reviewed 2. Physical Activities:  Home chores, takes occ walks   3. Depression/mood:  neg screen  4. Hearing: decreased on the R x a while, stable . Denies tinnitus 5. ADL's:  Independent  , drives  6. Fall Risk: prevention discussed    7. home Safety: does feel safe at home   8. Height, weight, &visual acuity: see VS, no problems w/ vision, sees eye doctor yearly 9. Counseling: provided 10. Labs ordered based on risk factors: if needed   11. Referral Coordination: if needed 12.  Care Plan, see assessment and plan   13.   Cognitive Assessment:motor skills and cognition appropriate for age   In addition, today we discussed the following: Osteoporosis, on Fosamax, good  compliance, no apparent side effects DJD, still have some aches and pains, fortunately it is not a major issue at this time for the patient.   ROS Denies chest pain or difficulty breathing No nausea, vomiting, diarrhea or blood in the stools No dysuria, gross hematuria and difficulty urinating  Past Medical History  Diagnosis Date  . Osteoporosis   . Osteoarthritis     h/o back pain, sees ortho s/p shots  . Microhematuria     s/p eval by urology 2008: U/S, CT, cysto w/ neg Bx (dx w/ a renal cyst, see reports)  . Cataract     Past Surgical History  Procedure Laterality Date  . Appendectomy    . Abdominal hysterectomy      no oophorectomy per pt  . Tonsillectomy    . Dilation and curettage of uterus      History   Social History  . Marital Status: Married    Spouse Name: N/A    Number of Children: 0  . Years of Education: N/A   Occupational History  . retired    Social History Main Topics  . Smoking status: Former Research scientist (life sciences)  . Smokeless tobacco: Never Used  . Alcohol Use: Yes   Comment: socially -wine  . Drug Use: No  . Sexual Activity: Not on file   Other Topics Concern  . Not on file   Social History Narrative   Household -- lives by herself in a townhouse    Emergency contact-- [name] (sister in law) lives in [place] --[phone]   She is her health POA     Family History  Problem Relation Age of Onset  . Colon cancer Neg Hx   . Breast cancer Neg Hx   . Heart disease Father   . Heart disease Mother        Medication List       This list is accurate as of: 03/27/14  2:21 PM.  Always use your most recent med list.               alendronate 70 MG tablet  Commonly known as:  FOSAMAX  Take 1 tablet by mouth  every 7 days with a full   glass of water on an empty  stomach.     CALTRATE 600 PO  Take by mouth.     CENTRUM SILVER Chew  Chew by mouth.     FIBER PO  Take by mouth.  Objective:   Physical Exam BP 137/76  Pulse 71  Temp(Src) 98.2 F (36.8 C) (Oral)  Ht 5\' 5"  (1.651 m)  Wt 156 lb (70.761 kg)  BMI 25.96 kg/m2  SpO2 97% General -- alert, well-developed, NAD.  Neck --no thyromegaly , normal carotid pulse HEENT-- Not pale  Lungs -- normal respiratory effort, no intercostal retractions, no accessory muscle use, and normal breath sounds.  Heart-- normal rate, regular rhythm, no murmur.  Abdomen-- Not distended, good bowel sounds,soft, non-tender. No bruit  Extremities-- no pretibial edema bilaterally  Neurologic--  alert & oriented X3. Speech normal, gait appropriate for age, strength symmetric and appropriate for age.  Psych-- Cognition and judgment appear intact. Cooperative with normal attention span and concentration. No anxious or depressed appearing.       Assessment & Plan:

## 2014-04-02 ENCOUNTER — Other Ambulatory Visit (INDEPENDENT_AMBULATORY_CARE_PROVIDER_SITE_OTHER): Payer: Medicare Other

## 2014-04-02 DIAGNOSIS — Z1210 Encounter for screening for malignant neoplasm of intestinal tract, unspecified: Secondary | ICD-10-CM | POA: Diagnosis not present

## 2014-04-02 DIAGNOSIS — Z0000 Encounter for general adult medical examination without abnormal findings: Secondary | ICD-10-CM

## 2014-04-03 LAB — FECAL OCCULT BLOOD, IMMUNOCHEMICAL: Fecal Occult Bld: NEGATIVE

## 2014-04-24 ENCOUNTER — Other Ambulatory Visit (INDEPENDENT_AMBULATORY_CARE_PROVIDER_SITE_OTHER): Payer: Medicare Other

## 2014-04-24 DIAGNOSIS — E870 Hyperosmolality and hypernatremia: Secondary | ICD-10-CM

## 2014-04-24 LAB — BASIC METABOLIC PANEL
BUN: 15 mg/dL (ref 6–23)
CO2: 28 meq/L (ref 19–32)
Calcium: 9.2 mg/dL (ref 8.4–10.5)
Chloride: 104 mEq/L (ref 96–112)
Creatinine, Ser: 0.8 mg/dL (ref 0.4–1.2)
GFR: 78.03 mL/min (ref >60.00)
GLUCOSE: 82 mg/dL (ref 70–99)
POTASSIUM: 4 meq/L (ref 3.5–5.1)
SODIUM: 138 meq/L (ref 135–145)

## 2014-04-26 DIAGNOSIS — H524 Presbyopia: Secondary | ICD-10-CM | POA: Diagnosis not present

## 2014-04-26 DIAGNOSIS — H25012 Cortical age-related cataract, left eye: Secondary | ICD-10-CM | POA: Diagnosis not present

## 2014-04-26 DIAGNOSIS — H18413 Arcus senilis, bilateral: Secondary | ICD-10-CM | POA: Diagnosis not present

## 2014-04-29 ENCOUNTER — Telehealth: Payer: Self-pay | Admitting: Internal Medicine

## 2014-04-29 NOTE — Telephone Encounter (Signed)
Glomerular filtration rate, a measure of her kidney function which in her case is very good

## 2014-04-29 NOTE — Telephone Encounter (Signed)
Please advise 

## 2014-04-29 NOTE — Telephone Encounter (Signed)
Caller name: Breanda Relation to pt: self Call back number: [phone] Pharmacy:  Reason for call:   Patient received lab results in the mail and wants to know what GFR means

## 2014-04-30 NOTE — Telephone Encounter (Signed)
Spoke with Pt and informed her of GFR and informed her that her rate is considered good.

## 2014-05-22 ENCOUNTER — Other Ambulatory Visit: Payer: Self-pay | Admitting: Internal Medicine

## 2014-05-23 ENCOUNTER — Telehealth: Payer: Self-pay | Admitting: Internal Medicine

## 2014-05-23 MED ORDER — ALENDRONATE SODIUM 70 MG PO TABS: ORAL_TABLET | ORAL | Status: DC

## 2014-05-23 NOTE — Telephone Encounter (Signed)
Fosamax sent to [facility] and 1 RF.

## 2014-05-23 NOTE — Telephone Encounter (Signed)
Caller name:Kohn, [name] Relation to XB:MWUX Call back number:[phone] Pharmacy[facility]  Reason for call: pt is needing rx alendronate (FOSAMAX) 70 MG , states to fax rx to [phone]

## 2014-07-26 DIAGNOSIS — L84 Corns and callosities: Secondary | ICD-10-CM | POA: Diagnosis not present

## 2014-07-26 DIAGNOSIS — M79672 Pain in left foot: Secondary | ICD-10-CM | POA: Diagnosis not present

## 2014-07-26 DIAGNOSIS — B351 Tinea unguium: Secondary | ICD-10-CM | POA: Diagnosis not present

## 2014-07-26 DIAGNOSIS — M79671 Pain in right foot: Secondary | ICD-10-CM | POA: Diagnosis not present

## 2014-08-21 DIAGNOSIS — H109 Unspecified conjunctivitis: Secondary | ICD-10-CM | POA: Diagnosis not present

## 2014-08-26 ENCOUNTER — Encounter: Payer: Self-pay | Admitting: Internal Medicine

## 2014-08-26 ENCOUNTER — Ambulatory Visit (INDEPENDENT_AMBULATORY_CARE_PROVIDER_SITE_OTHER): Payer: Medicare Other | Admitting: Internal Medicine

## 2014-08-26 VITALS — BP 124/64 | HR 71 | Temp 98.0°F | Ht 65.0 in | Wt 159.1 lb

## 2014-08-26 DIAGNOSIS — J069 Acute upper respiratory infection, unspecified: Secondary | ICD-10-CM

## 2014-08-26 MED ORDER — AZITHROMYCIN 250 MG PO TABS: ORAL_TABLET | ORAL | Status: DC

## 2014-08-26 MED ORDER — AZELASTINE HCL 0.1 % NA SOLN: 2.0000 | Freq: Every evening | NASAL | Status: DC | PRN

## 2014-08-26 NOTE — Progress Notes (Signed)
Subjective:    Patient ID: Kimberly Rodriguez, female    DOB: 07-Dec-1926, 81 y.o.   MRN: [id number]  DOS:  08/26/2014 Type of visit - description : acute Interval history: Sx started last week, primarily with eye congestion, went to a UC, was prescribed eyedrops and feels better. Shortly after, she started with runny nose, dry cough, nasal discharge mostly bloody. Has taking Mucinex a couple of times with help   Review of Systems  No fever chills Mild sore throat, no ear pain. No chest pain or difficulty breathing No nausea or vomiting No wheezing, chest congestion , saw a  small amount of clear sputum one time.  Past Medical History  Diagnosis Date  . Osteoporosis   . Osteoarthritis     h/o back pain, sees ortho s/p shots  . Microhematuria     s/p eval by urology 2008: U/S, CT, cysto w/ neg Bx (dx w/ a renal cyst, see reports)  . Cataract     Past Surgical History  Procedure Laterality Date  . Appendectomy    . Abdominal hysterectomy      no oophorectomy per pt  . Tonsillectomy    . Dilation and curettage of uterus      History   Social History  . Marital Status: Married    Spouse Name: N/A  . Number of Children: 0  . Years of Education: N/A   Occupational History  . retired    Social History Main Topics  . Smoking status: Former Research scientist (life sciences)  . Smokeless tobacco: Never Used  . Alcohol Use: Yes     Comment: socially -wine  . Drug Use: No  . Sexual Activity: Not on file   Other Topics Concern  . Not on file   Social History Narrative   Household -- lives by herself in a townhouse    Emergency contact-- [name] (sister in law) lives in [place] --[phone]   She is her health POA        Medication List       This list is accurate as of: 08/26/14 11:59 PM.  Always use your most recent med list.               alendronate 70 MG tablet  Commonly known as:  FOSAMAX  Take 1 tablet by mouth  every 7 days with a full   glass of water on an empty  stomach.     azelastine 0.1 % nasal spray  Commonly known as:  ASTELIN  Place 2 sprays into both nostrils at bedtime as needed for rhinitis. Use in each nostril as directed     azithromycin 250 MG tablet  Commonly known as:  ZITHROMAX Z-PAK  2 tabs a day the first day, then 1 tab a day x 4 days     CALTRATE 600 PO  Take by mouth.     CENTRUM SILVER Chew  Chew by mouth.     FIBER PO  Take by mouth.           Objective:   Physical Exam BP 124/64 mmHg  Pulse 71  Temp(Src) 98 F (36.7 C) (Oral)  Ht 5\' 5"  (1.651 m)  Wt 159 lb 2 oz (72.179 kg)  BMI 26.48 kg/m2  SpO2 97% General:   Well developed, well nourished . NAD.  HEENT:  Normocephalic . Face symmetric, atraumatic; nose definitely congested, Sinuses not TTP Ears: Tympanic membrane normal although the left side is hard to see due  to wax Throat: Symmetric, no redness or discharge Lungs:  CTA B (few rhonchi with cough) Normal respiratory effort, no intercostal retractions, no accessory muscle use. Heart: RRR,  no murmur.  Muscle skeletal: no pretibial edema bilaterally  Skin: Not pale. Not jaundice Neurologic:  alert & oriented X3.  Speech normal, gait appropriate for age and unassisted Psych--  Cognition and judgment appear intact.  Cooperative with normal attention span and concentration.  Behavior appropriate. No anxious or depressed appearing.      Assessment & Plan:    URI Symptoms consistent with URI, early bronchitis?. See  Instructions

## 2014-08-26 NOTE — Patient Instructions (Addendum)
Rest, fluids , tylenol   take Mucinex DM twice a day as needed until better   Use OTC Nasocort or Flonase : 2 nasal sprays on each side of the nose daily until you feel better Also ASTELIN nose spray at night  Take the antibiotic as prescribed  (zithromax) if no better in 3 days Call if not gradually better over the next  10 days Call anytime if the symptoms are severe

## 2014-08-26 NOTE — Progress Notes (Signed)
Pre visit review using our clinic review tool, if applicable. No additional management support is needed unless otherwise documented below in the visit note. 

## 2014-09-02 ENCOUNTER — Emergency Department (HOSPITAL_BASED_OUTPATIENT_CLINIC_OR_DEPARTMENT_OTHER)
Admission: EM | Admit: 2014-09-02 | Discharge: 2014-09-02 | Disposition: A | Discharge status: Home / Self Care | Payer: Medicare Other | Attending: Emergency Medicine | Admitting: Emergency Medicine

## 2014-09-02 ENCOUNTER — Emergency Department (HOSPITAL_BASED_OUTPATIENT_CLINIC_OR_DEPARTMENT_OTHER): Payer: Medicare Other

## 2014-09-02 ENCOUNTER — Encounter (HOSPITAL_BASED_OUTPATIENT_CLINIC_OR_DEPARTMENT_OTHER): Payer: Self-pay | Admitting: Emergency Medicine

## 2014-09-02 DIAGNOSIS — R05 Cough: Secondary | ICD-10-CM | POA: Insufficient documentation

## 2014-09-02 DIAGNOSIS — Z79899 Other long term (current) drug therapy: Secondary | ICD-10-CM | POA: Insufficient documentation

## 2014-09-02 DIAGNOSIS — Z87891 Personal history of nicotine dependence: Secondary | ICD-10-CM | POA: Insufficient documentation

## 2014-09-02 DIAGNOSIS — J984 Other disorders of lung: Secondary | ICD-10-CM | POA: Diagnosis not present

## 2014-09-02 DIAGNOSIS — R002 Palpitations: Secondary | ICD-10-CM | POA: Insufficient documentation

## 2014-09-02 DIAGNOSIS — I517 Cardiomegaly: Secondary | ICD-10-CM | POA: Diagnosis not present

## 2014-09-02 DIAGNOSIS — Z8669 Personal history of other diseases of the nervous system and sense organs: Secondary | ICD-10-CM | POA: Insufficient documentation

## 2014-09-02 DIAGNOSIS — I499 Cardiac arrhythmia, unspecified: Secondary | ICD-10-CM | POA: Diagnosis present

## 2014-09-02 DIAGNOSIS — M810 Age-related osteoporosis without current pathological fracture: Secondary | ICD-10-CM | POA: Insufficient documentation

## 2014-09-02 LAB — COMPREHENSIVE METABOLIC PANEL
ALBUMIN: 3.3 g/dL — AB (ref 3.5–5.2)
ALT: 30 U/L (ref 0–35)
AST: 24 U/L (ref 0–37)
Alkaline Phosphatase: 82 U/L (ref 39–117)
Anion gap: 9 (ref 5–15)
BILIRUBIN TOTAL: 0.2 mg/dL — AB (ref 0.3–1.2)
BUN: 13 mg/dL (ref 6–23)
CO2: 31 mmol/L (ref 19–32)
CREATININE: 0.68 mg/dL (ref 0.50–1.10)
Calcium: 8.8 mg/dL (ref 8.4–10.5)
Chloride: 101 mmol/L (ref 96–112)
GFR calc Af Amer: 90 mL/min — ABNORMAL LOW (ref >90)
GFR calc non Af Amer: 78 mL/min — ABNORMAL LOW (ref >90)
Glucose, Bld: 104 mg/dL — ABNORMAL HIGH (ref 70–99)
Potassium: 4.3 mmol/L (ref 3.5–5.1)
Sodium: 141 mmol/L (ref 135–145)
Total Protein: 6.9 g/dL (ref 6.0–8.3)

## 2014-09-02 LAB — CBC WITH DIFFERENTIAL/PLATELET
BAND NEUTROPHILS: 1 % (ref 0–10)
BASOS PCT: 0 % (ref 0–1)
Basophils Absolute: 0 10*3/uL (ref 0.0–0.1)
Eosinophils Absolute: 0.4 10*3/uL (ref 0.0–0.7)
Eosinophils Relative: 5 % (ref 0–5)
HEMATOCRIT: 41.9 % (ref 36.0–46.0)
HEMOGLOBIN: 13.4 g/dL (ref 12.0–15.0)
LYMPHS PCT: 16 % (ref 12–46)
Lymphs Abs: 1.4 10*3/uL (ref 0.7–4.0)
MCH: 31.5 pg (ref 26.0–34.0)
MCHC: 32 g/dL (ref 30.0–36.0)
MCV: 98.4 fL (ref 78.0–100.0)
MONOS PCT: 8 % (ref 3–12)
Monocytes Absolute: 0.7 10*3/uL (ref 0.1–1.0)
NEUTROS ABS: 6.4 10*3/uL (ref 1.7–7.7)
Neutrophils Relative %: 70 % (ref 43–77)
Platelets: 344 10*3/uL (ref 150–400)
RBC: 4.26 MIL/uL (ref 3.87–5.11)
RDW: 12.9 % (ref 11.5–15.5)
WBC: 8.9 10*3/uL (ref 4.0–10.5)

## 2014-09-02 LAB — TROPONIN I: Troponin I: <0.03 ng/mL (ref <0.031)

## 2014-09-02 NOTE — ED Notes (Signed)
Pt states that her NP came for a home visit and told her she had an irregular heart beat.  Pt states she feels fine but she told her to come.  Pt drove self and NP followed in car behind.

## 2014-09-02 NOTE — Discharge Instructions (Signed)

## 2014-09-02 NOTE — ED Notes (Signed)
Attempted to call report to [facility] 3700 room 22 , Amor RN unable to take report at this time.

## 2014-09-02 NOTE — ED Provider Notes (Signed)
CSN: [id number]     Arrival date & time 09/02/14  1808 History   This chart was scribed for Kimberly Essex, MD by Evelene Croon, ED Scribe. This patient was seen in room MH04/MH04 and the patient's care was started 8:11 PM.    Chief Complaint  Patient presents with  . Irregular Heart Beat    The history is provided by the patient. No language interpreter was used.     HPI Comments:  Kimberly Rodriguez is a 81 y.o. female who presents to the Emergency Department complaining of  "irregular pulse" which was found by visiting nurse who was doing a wellness check on the pt this afternoon. Pt states she feels fine but was urged to be evaluated in the ED. She notes cough which she has seen her PCP for. She denies CP, SOB, and dizziness. No modifying factors noted.   Past Medical History  Diagnosis Date  . Osteoporosis   . Osteoarthritis     h/o back pain, sees ortho s/p shots  . Microhematuria     s/p eval by urology 2008: U/S, CT, cysto w/ neg Bx (dx w/ a renal cyst, see reports)  . Cataract    Past Surgical History  Procedure Laterality Date  . Appendectomy    . Abdominal hysterectomy      no oophorectomy per pt  . Tonsillectomy    . Dilation and curettage of uterus     Family History  Problem Relation Age of Onset  . Colon cancer Neg Hx   . Breast cancer Neg Hx   . Heart disease Father   . Heart disease Mother    History  Substance Use Topics  . Smoking status: Former Research scientist (life sciences)  . Smokeless tobacco: Never Used  . Alcohol Use: Yes     Comment: socially -wine   OB History    No data available     Review of Systems  Constitutional: Negative for appetite change.  Respiratory: Positive for cough. Negative for shortness of breath.   Cardiovascular: Negative for chest pain.  Neurological: Negative for dizziness.  All other systems reviewed and are negative.     Allergies  Review of patient's allergies indicates no known allergies.  Home Medications   Prior to  Admission medications   Medication Sig Start Date End Date Taking? Authorizing Provider  alendronate (FOSAMAX) 70 MG tablet Take 1 tablet by mouth  every 7 days with a full   glass of water on an empty  stomach. 05/23/14   Colon Branch, MD  azelastine (ASTELIN) 0.1 % nasal spray Place 2 sprays into both nostrils at bedtime as needed for rhinitis. Use in each nostril as directed 08/26/14   Colon Branch, MD  azithromycin (ZITHROMAX Z-PAK) 250 MG tablet 2 tabs a day the first day, then 1 tab a day x 4 days 08/26/14   Colon Branch, MD  Calcium Carbonate (CALTRATE 600 PO) Take by mouth.      Historical Provider, MD  FIBER PO Take by mouth.      Historical Provider, MD  Multiple Vitamins-Minerals (CENTRUM SILVER) CHEW Chew by mouth.      Historical Provider, MD   BP 155/79 mmHg  Pulse 72  Temp(Src) 98 F (36.7 C) (Oral)  Resp 16  Ht 5' 5.5" (1.664 m)  Wt 158 lb (71.668 kg)  BMI 25.88 kg/m2  SpO2 97% Physical Exam  Constitutional: She is oriented to person, place, and time. She appears well-developed and well-nourished. No  distress.  HENT:  Head: Normocephalic and atraumatic.  Mouth/Throat: Oropharynx is clear and moist. No oropharyngeal exudate.  Eyes: Conjunctivae and EOM are normal. Pupils are equal, round, and reactive to light.  Neck: Normal range of motion. Neck supple.  No meningismus.  Cardiovascular: Normal rate, normal heart sounds and intact distal pulses.  An irregular rhythm present.  No murmur heard. Pulmonary/Chest: Effort normal and breath sounds normal. No respiratory distress.  Abdominal: Soft. There is no tenderness. There is no rebound and no guarding.  Musculoskeletal: Normal range of motion. She exhibits no edema or tenderness.  Neurological: She is alert and oriented to person, place, and time. No cranial nerve deficit. She exhibits normal muscle tone. Coordination normal.  No ataxia on finger to nose bilaterally. No pronator drift. 5/5 strength throughout. CN 2-12 intact.  Negative Romberg. Equal grip strength. Sensation intact. Gait is normal.   Skin: Skin is warm.  Psychiatric: She has a normal mood and affect. Her behavior is normal.  Nursing note and vitals reviewed.   ED Course  Procedures   DIAGNOSTIC STUDIES:  Oxygen Saturation is 99% on RA, normal by my interpretation.    COORDINATION OF CARE:  8:16 PM Discussed treatment plan with pt at bedside and pt agreed to plan.  Labs Review Labs Reviewed  COMPREHENSIVE METABOLIC PANEL - Abnormal; Notable for the following:    Glucose, Bld 104 (*)    Albumin 3.3 (*)    Total Bilirubin 0.2 (*)    GFR calc non Af Amer 78 (*)    GFR calc Af Amer 90 (*)    All other components within normal limits  CBC WITH DIFFERENTIAL/PLATELET  TROPONIN I    Imaging Review Dg Chest 2 View  09/02/2014   CLINICAL DATA:  Initial evaluation for irregular pulse  EXAM: CHEST  2 VIEW  COMPARISON:  03/21/2007  FINDINGS: Moderate hiatal hernia. Mild cardiac enlargement. Mild to moderate diffuse interstitial prominence, which appears new or increased when compared to the prior study. No focal consolidation or effusion.  IMPRESSION: Moderate hiatal hernia  Mild to moderate diffuse interstitial lung disease new from prior study. The appearance is most suggestive of chronic inflammatory interstitial lung disease.   Electronically Signed   By: Skipper Cliche M.D.   On: 09/02/2014 20:41     EKG Interpretation   Date/Time:  Monday September 02 2014 21:54:33 EDT Ventricular Rate:  66 PR Interval:  144 QRS Duration: 86 QT Interval:  410 QTC Calculation: 429 R Axis:   33 Text Interpretation:  Normal sinus rhythm with sinus arrhythmia Normal ECG  No significant change was found Confirmed by Wyvonnia Dusky  MD, Kelce Bouton [phone])  on 09/02/2014 10:42:53 PM      MDM   Final diagnoses:  Palpitations   Home nurse found irregular pulse.  Patient feels fine.  No dizziness, lightheadedness, CP, SOB. No history of Afib.  EKGs and monitor  show sinus arrhythmia.  No heart block, no Afib. Electrolytes normal.  D/w patient follow up with PCP and cardiology for possible holter monitor.   I personally performed the services described in this documentation, which was scribed in my presence. The recorded information has been reviewed and is accurate.    Kimberly Essex, MD 09/03/14 [phone]

## 2014-09-04 ENCOUNTER — Ambulatory Visit (INDEPENDENT_AMBULATORY_CARE_PROVIDER_SITE_OTHER): Payer: Medicare Other | Admitting: Internal Medicine

## 2014-09-04 ENCOUNTER — Encounter: Payer: Self-pay | Admitting: Internal Medicine

## 2014-09-04 VITALS — BP 132/84 | HR 83 | Temp 98.2°F | Ht 65.0 in | Wt 153.5 lb

## 2014-09-04 DIAGNOSIS — J984 Other disorders of lung: Secondary | ICD-10-CM | POA: Diagnosis not present

## 2014-09-04 DIAGNOSIS — I499 Cardiac arrhythmia, unspecified: Secondary | ICD-10-CM

## 2014-09-04 NOTE — Progress Notes (Signed)
Subjective:    Patient ID: Kimberly Rodriguez, female    DOB: 07-16-1926, 81 y.o.   MRN: [id number]  DOS:  09/04/2014 Type of visit - description : ER f/u Interval history: Visiting nurse found her to have irregular heartbeat and was sent to the ER. At the ER, EKG showed sinus arrhythmia, labs were within normal. Also, chest x-ray showed interstitial disease. She is here for follow-up, she feels well.    Review of Systems  denies chest pain, difficulty breathing or palpitations Was seen recently with a respiratory infection, symptoms resolved. Denies any chronic cough, sputum production. No dyspnea on exertion   Past Medical History  Diagnosis Date  . Osteoporosis   . Osteoarthritis     h/o back pain, sees ortho s/p shots  . Microhematuria     s/p eval by urology 2008: U/S, CT, cysto w/ neg Bx (dx w/ a renal cyst, see reports)  . Cataract     Past Surgical History  Procedure Laterality Date  . Appendectomy    . Abdominal hysterectomy      no oophorectomy per pt  . Tonsillectomy    . Dilation and curettage of uterus      History   Social History  . Marital Status: Married    Spouse Name: N/A  . Number of Children: 0  . Years of Education: N/A   Occupational History  . retired    Social History Main Topics  . Smoking status: Former Research scientist (life sciences)  . Smokeless tobacco: Never Used  . Alcohol Use: Yes     Comment: socially -wine  . Drug Use: No  . Sexual Activity: Not on file   Other Topics Concern  . Not on file   Social History Narrative   Household -- lives by herself in a townhouse    Emergency contact-- [name] (sister in law) lives in [place] --[phone]   She is her health POA        Medication List       This list is accurate as of: 09/04/14 11:59 PM.  Always use your most recent med list.               alendronate 70 MG tablet  Commonly known as:  FOSAMAX  Take 1 tablet by mouth  every 7 days with a full   glass of water on an empty  stomach.     azelastine 0.1 % nasal spray  Commonly known as:  ASTELIN  Place 2 sprays into both nostrils at bedtime as needed for rhinitis. Use in each nostril as directed     CALTRATE 600 PO  Take by mouth.     CENTRUM SILVER Chew  Chew by mouth.     FIBER PO  Take by mouth.           Objective:   Physical Exam BP 132/84 mmHg  Pulse 83  Temp(Src) 98.2 F (36.8 C) (Oral)  Ht 5\' 5"  (1.651 m)  Wt 153 lb 8 oz (69.627 kg)  BMI 25.54 kg/m2  SpO2 98%  General:   Well developed, well nourished . NAD.  HEENT:  Normocephalic . Face symmetric, atraumatic Lungs:  CTA B Normal respiratory effort, no intercostal retractions, no accessory muscle use. Heart: RRR (+ respiratory rate variation),  no murmur.  Muscle skeletal: no pretibial edema bilaterally  Skin: Not pale. Not jaundice Neurologic:  alert & oriented X3.  Speech normal, gait appropriate for age and unassisted Psych--  Cognition and  judgment appear intact.  Cooperative with normal attention span and concentration.  Behavior appropriate. No anxious or depressed appearing.       Assessment & Plan:     Irregular heartbeat? No evidence of atrial fibrillation or other arrhythmias, recommend observation, she does have variable rate likely related to respiration.

## 2014-09-05 DIAGNOSIS — J984 Other disorders of lung: Secondary | ICD-10-CM | POA: Insufficient documentation

## 2014-09-05 NOTE — Assessment & Plan Note (Signed)
Pulmonary fibrosis? Recent chest x-ray showed diffuse interstitial lung disease, the patient is a former smoker, she quit in the early 80s. She is asymptomatic. For now recommend to get PFTs for baseline, we'll reassess  in few months, consider pulmonary eval See report below. ------------  IMPRESSION: Moderate hiatal hernia  Mild to moderate diffuse interstitial lung disease new from prior study. The appearance is most suggestive of chronic inflammatory interstitial lung disease.

## 2014-09-19 ENCOUNTER — Ambulatory Visit (INDEPENDENT_AMBULATORY_CARE_PROVIDER_SITE_OTHER): Payer: Medicare Other | Admitting: Internal Medicine

## 2014-09-19 ENCOUNTER — Encounter (INDEPENDENT_AMBULATORY_CARE_PROVIDER_SITE_OTHER): Payer: Self-pay

## 2014-09-19 DIAGNOSIS — J984 Other disorders of lung: Secondary | ICD-10-CM

## 2014-09-19 LAB — PULMONARY FUNCTION TEST
DL/VA % PRED: 91 %
DL/VA: 4.47 ml/min/mmHg/L
DLCO UNC: 25.4 ml/min/mmHg
DLCO unc % pred: 101 %
FEF 25-75 PRE: 1.82 L/s
FEF 25-75 Post: 2.06 L/sec
FEF2575-%CHANGE-POST: 13 %
FEF2575-%PRED-PRE: 158 %
FEF2575-%Pred-Post: 179 %
FEV1-%Change-Post: 2 %
FEV1-%Pred-Post: 115 %
FEV1-%Pred-Pre: 112 %
FEV1-Post: 2.07 L
FEV1-Pre: 2.01 L
FEV1FVC-%CHANGE-POST: 10 %
FEV1FVC-%Pred-Pre: 105 %
FEV6-%CHANGE-POST: -6 %
FEV6-%PRED-POST: 105 %
FEV6-%PRED-PRE: 112 %
FEV6-POST: 2.4 L
FEV6-PRE: 2.57 L
FEV6FVC-%Change-Post: 1 %
FEV6FVC-%PRED-POST: 106 %
FEV6FVC-%Pred-Pre: 104 %
FVC-%CHANGE-POST: -7 %
FVC-%PRED-POST: 100 %
FVC-%Pred-Pre: 108 %
FVC-POST: 2.42 L
FVC-PRE: 2.62 L
POST FEV6/FVC RATIO: 100 %
Post FEV1/FVC ratio: 85 %
Pre FEV1/FVC ratio: 77 %
Pre FEV6/FVC Ratio: 98 %
RV % pred: 88 %
RV: 2.25 L
TLC % PRED: 93 %
TLC: 4.82 L

## 2014-09-19 NOTE — Progress Notes (Signed)
PFT done today. 

## 2014-09-20 ENCOUNTER — Telehealth: Payer: Self-pay | Admitting: Internal Medicine

## 2014-09-20 DIAGNOSIS — H9191 Unspecified hearing loss, right ear: Secondary | ICD-10-CM

## 2014-09-20 NOTE — Telephone Encounter (Signed)
Caller name: Tamanna Relation to pt: self Call back number: [phone] Pharmacy:  Reason for call:   Patient is requesting to be referred to Dr. Hoyle Barr at [facility] audiology. She is not sure if it is hearing loss in R ear or accumulation of ear wax. Patient states that she has noticed a change in her hearing the past few weeks.

## 2014-09-20 NOTE — Telephone Encounter (Signed)
Referral placed.

## 2014-09-20 NOTE — Telephone Encounter (Signed)
Please do

## 2014-09-20 NOTE — Telephone Encounter (Signed)
Okay to place referral

## 2014-09-21 DIAGNOSIS — H6123 Impacted cerumen, bilateral: Secondary | ICD-10-CM | POA: Diagnosis not present

## 2014-09-21 DIAGNOSIS — H6691 Otitis media, unspecified, right ear: Secondary | ICD-10-CM | POA: Diagnosis not present

## 2014-09-24 ENCOUNTER — Telehealth: Payer: Self-pay | Admitting: Internal Medicine

## 2014-09-24 NOTE — Telephone Encounter (Signed)
Caller name:Pender, Layah Relation to VG:KKDP Call back [place]:  Reason for call: pt would like to know if her results are back from her pulmonary test that she took on last thursday

## 2014-09-24 NOTE — Telephone Encounter (Signed)
Spoke with Pt, informed her that LFT's were normal. No further F/U needed at this time per Dr. Larose Kells. Pt verbalized understanding.

## 2014-10-02 ENCOUNTER — Ambulatory Visit: Payer: Medicare Other | Attending: Audiology | Admitting: Audiology

## 2014-10-02 DIAGNOSIS — H9191 Unspecified hearing loss, right ear: Secondary | ICD-10-CM

## 2014-10-02 DIAGNOSIS — H9192 Unspecified hearing loss, left ear: Secondary | ICD-10-CM | POA: Diagnosis not present

## 2014-10-02 DIAGNOSIS — H748X1 Other specified disorders of right middle ear and mastoid: Secondary | ICD-10-CM

## 2014-10-02 DIAGNOSIS — H93292 Other abnormal auditory perceptions, left ear: Secondary | ICD-10-CM

## 2014-10-02 NOTE — Procedures (Signed)
[facility] [address] [place], [place] [address] [place] EVALUATION  Name: Kimberly Rodriguez DOB:  27-Aug-1926 MRN:  [id number]                                 Diagnosis: Hearing loss Date: 10/02/2014    Referent: Kathlene November, MD  HISTORY: Kimberly Rodriguez, age 81 y.o. years, was seen for an audiological evaluation She reports that her right ear hearing as become worse in the past few months.  She reports feelings of "pressure" on the right side "every now and then".    EVALUATION: Pure tone air and tone conduction was completed using conventional audiometry with inserts. Hearing   Thresholds are 35-80 dBHL on the left side and 60-90 dBHL on the right side.  The hearing loss is mixed bilaterally.  Speech reception thresholds are 60 dBHL in the right ear and 40 dBHL in the left ear using recorded spondee words.  The reliability is good. Word recognition is 94% at 90dBHL in the right and 88% at 70dBHL in the left using recorded NU-6 word lists in quiet (contralateral masking was used). In minimal background noise with +5dB signal to noise ratio word recognition is 30 % in the right ear and 56% in the left ear. Otoscopic inspection reveals clear ear canals with visible tympanic membranes.  Tympanometry showed in the right ear was abnormal and flat (Type B)  and was within normal limits in the left ear  (Type A).    CONCLUSION:  Kimberly Rodriguez has abnormal middle ear function on the right side with no tympanic membrane compliance. Further evaluation by an Ear, Nose and Throat physician is strongly recommended.  In addition, she has  a moderately severe to profound hearing loss on the right side which is mixed.  The left ear has normal middle ear function with a mild to severe hearing loss that is mixed, but predominantly sensorineural.  She has excellent word recognition on the right side at very loud levels and the left ear has good word  recognition at loud levels, in quiet.  In minimal background noise her word recognition drops to poor bilaterally.   Following medical clearance by an Ear, Nose and Throat physician, a hearing aid evaluation is recommended. Kimberly Rodriguez has checked with her insurance and would like to stay "in network" and has found "Dr. Laurance Flatten, ENT at [facility] (Tel [phone]).   Amplification may be covered by some insurances, but not all.  It is important to note that hearing aids must be individually fit according to the hearing test results and the ear shape.  Audiologists and hearing aid dealers in [place] must be licensed in order to dispense hearing aids.  In addition, a trial period is mandated by law in our state because often amplification must be tried and then evaluated in order to determine benefit.  As discussed with Kimberly Rodriguez, there are many excellent choices when it comes to amplification in our area and providers are listed in the phone book under hearing aids, may be affiliated with Ear, [place] and Throat physicians, are located at [facility] and [employer] as well as the [facility] speech and hearing center. In addition, she may wish to contact Equipment Distribution Services in [place] to determine whether she may receive help with obtaining one hearing aid or one captioned telephone if hearing loss and  financial qualifications are met.  Please contact Rex Kras at [phone] .List of Providers for EDS Hearing Aid Distribution [facility] December 20, 2011 - December 18, 2013. The following hearing aid companies have contracted with [place] to fit hearing aids for eligible applicants.[facility] [address] [place], [place] [facility] ENT  [place]  [phone] and [place]  [phone] [place]  [phone] [place] [phone] [facility] [place]  [phone]   RECOMMENDATIONS: 1) Further evaluation by an ENT because of the mixed component. 2)  Following medical clearance a hearing aid evaluation. 3)  Use care and be visually vigilant when driving since the left ear currently perceives sounds louder than the right ear.  4) Use hearing protection when around loud sounds.  Unnamed Zeien L. Heide Spark, Au.D., CCC-A Doctor of Audiology

## 2014-10-02 NOTE — Patient Instructions (Addendum)
Mrs. Sifuentes has a moderately severe to profound hearing loss on the right side which is mixed.  The left ear has a mild to severe hearing loss that is mixed, but predominantly sensorineural.  She has excellent word recogniton on the right side at very loud levels and the left ear has good word recognition at loud levels, in quiet.  In minimal background noise her word recognition drops to poor bilaterally.  Recommendations: 1) Further evaluation by an ENT because of the mixed component. 2)  Following medical clearance a hearing aid evaluation. 3)       Amplification helps make the signal louder and therefore often improves hearing and word recognition.  Amplification has many forms including hearing aids in one or both ears, an assistive listening device which have a microphone and speaker such as a small handheld device and/or even a surround sound system of speakers.  Amplification may be covered by some insurances, but not all.  It is important to note that hearing aids must be individually fit according to the hearing test results and the ear shape.  Audiologists and hearing aid dealers in [place] must be licensed in order to dispense hearing aids.  In addition, a trial period is mandated by law in our state because often amplification must be tried and then evaluated in order to determine benefit.       There are many excellent choices when it comes to amplification in our area and providers are listed in the phone book under hearing aids, may be affiliated with Ear, [place] and Throat physicians, are located at [place] as well as the [facility] speech and hearing center. 4) Equipment Distribution Services in [place] may help with obtaining one hearing aid or one captioned telephone if hearing loss and financial qualifications are met.  Please contact Rex Kras at [phone] .  List of Providers for EDS Hearing Aid Distribution [facility] December 20, 2011  - December 18, 2013. The following hearing aid companies have contracted with [place] to fit hearing aids for eligible applicants. Representatives of these companies are not [facility] employees. Inclusion on this list means the company agrees to the terms of contract pricing and inclusion is not an endorsement of their services over those who are not contracted with the division. An applicant may choose any provider listed from your region to assist in obtaining hearing aids through this service. If problems arise during this process, please contact the [facility] that provided the application.  [facility] Vendor List 1 Updated; October 16, 2012  [place] [place] [facility]  [place] [phone] & [place] [phone] [place] [address] [place], [place] [facility] [place], [place] [facility]NT [place] and [place], [place] [facility] Doctors [place], [place], [place] [facility] [address] [place], [place] [facility] ENT  [facility]  [phone] and [place]  [phone] Hearing [place] [phone] and [place] [phone] [place]  [phone] [facility] [phone] or [address] ENT [place], [place] [phone] [facility] ENT  [place], [place] [facility] [place]  [phone] [facility] [place], [phone], [place]  [facility] Speech &  [place]      [place] Heide Spark, Au.D., CCC-A Doctor of Audiology

## 2014-10-09 ENCOUNTER — Telehealth: Payer: Self-pay | Admitting: Internal Medicine

## 2014-10-09 DIAGNOSIS — H9190 Unspecified hearing loss, unspecified ear: Secondary | ICD-10-CM

## 2014-10-09 NOTE — Telephone Encounter (Signed)
Please arrange a ENT referral, ask pt about what dx to use

## 2014-10-09 NOTE — Telephone Encounter (Signed)
Pt called back requesting [place]. Toya Smothers., MD [facility] [place] [phone] (ear nose throat referral)

## 2014-10-09 NOTE — Telephone Encounter (Signed)
Please advise 

## 2014-10-09 NOTE — Telephone Encounter (Signed)
Caller name: Behrens, Rubena Roseman Relation to pt: self  Call back number: [phone]   Reason for call:  Pt was seen by [facility] on 10/02/14 and stated MD should have received results. Pt is requesting a referral for ENT. Please advise

## 2014-10-09 NOTE — Telephone Encounter (Signed)
Referral placed to ENT, Dr. Hassell Done as requested.

## 2014-10-15 ENCOUNTER — Other Ambulatory Visit: Payer: Self-pay | Admitting: Internal Medicine

## 2014-10-15 DIAGNOSIS — Z1231 Encounter for screening mammogram for malignant neoplasm of breast: Secondary | ICD-10-CM

## 2014-10-23 DIAGNOSIS — H6980 Other specified disorders of Eustachian tube, unspecified ear: Secondary | ICD-10-CM | POA: Diagnosis not present

## 2014-10-23 DIAGNOSIS — H6983 Other specified disorders of Eustachian tube, bilateral: Secondary | ICD-10-CM | POA: Diagnosis not present

## 2014-10-28 ENCOUNTER — Other Ambulatory Visit: Payer: Self-pay

## 2014-10-31 DIAGNOSIS — L578 Other skin changes due to chronic exposure to nonionizing radiation: Secondary | ICD-10-CM | POA: Diagnosis not present

## 2014-10-31 DIAGNOSIS — D1801 Hemangioma of skin and subcutaneous tissue: Secondary | ICD-10-CM | POA: Diagnosis not present

## 2014-10-31 DIAGNOSIS — L821 Other seborrheic keratosis: Secondary | ICD-10-CM | POA: Diagnosis not present

## 2014-11-15 ENCOUNTER — Ambulatory Visit (HOSPITAL_BASED_OUTPATIENT_CLINIC_OR_DEPARTMENT_OTHER)
Admission: RE | Admit: 2014-11-15 | Discharge: 2014-11-15 | Disposition: A | Discharge status: Home / Self Care | Payer: Medicare Other | Source: Ambulatory Visit | Attending: Internal Medicine | Admitting: Internal Medicine

## 2014-11-15 DIAGNOSIS — Z1231 Encounter for screening mammogram for malignant neoplasm of breast: Secondary | ICD-10-CM | POA: Diagnosis not present

## 2014-12-27 DIAGNOSIS — R0982 Postnasal drip: Secondary | ICD-10-CM | POA: Diagnosis not present

## 2014-12-27 DIAGNOSIS — H906 Mixed conductive and sensorineural hearing loss, bilateral: Secondary | ICD-10-CM | POA: Diagnosis not present

## 2014-12-27 DIAGNOSIS — H6980 Other specified disorders of Eustachian tube, unspecified ear: Secondary | ICD-10-CM | POA: Diagnosis not present

## 2014-12-31 ENCOUNTER — Other Ambulatory Visit: Payer: Self-pay

## 2015-01-17 ENCOUNTER — Ambulatory Visit (INDEPENDENT_AMBULATORY_CARE_PROVIDER_SITE_OTHER): Payer: Medicare Other | Admitting: Medical

## 2015-01-17 ENCOUNTER — Encounter: Payer: Self-pay | Admitting: Medical

## 2015-01-17 ENCOUNTER — Ambulatory Visit (HOSPITAL_BASED_OUTPATIENT_CLINIC_OR_DEPARTMENT_OTHER)
Admission: RE | Admit: 2015-01-17 | Discharge: 2015-01-17 | Disposition: A | Discharge status: Home / Self Care | Payer: Medicare Other | Source: Ambulatory Visit | Attending: Medical | Admitting: Medical

## 2015-01-17 VITALS — BP 149/66 | HR 75 | Temp 98.6°F | Ht 65.0 in | Wt 153.8 lb

## 2015-01-17 DIAGNOSIS — R059 Cough, unspecified: Secondary | ICD-10-CM

## 2015-01-17 DIAGNOSIS — J3089 Other allergic rhinitis: Secondary | ICD-10-CM | POA: Diagnosis not present

## 2015-01-17 DIAGNOSIS — J984 Other disorders of lung: Secondary | ICD-10-CM | POA: Diagnosis not present

## 2015-01-17 DIAGNOSIS — R05 Cough: Secondary | ICD-10-CM

## 2015-01-17 MED ORDER — BENZONATATE 100 MG PO CAPS: 100.0000 mg | ORAL_CAPSULE | Freq: Three times a day (TID) | ORAL | Status: DC | PRN

## 2015-01-17 MED ORDER — AZITHROMYCIN 250 MG PO TABS: ORAL_TABLET | ORAL | Status: DC

## 2015-01-17 NOTE — Progress Notes (Signed)
Subjective:    Patient ID: Kimberly Rodriguez, female    DOB: February 10, 1927, 81 y.o.   MRN: [id number]  HPI  Pt states over past month she developed some nasal congestion with some chest congestion.  Pt saw Ent for hearing and flonase prescribed at that time. Pt allergic to astelin.  Pt when she blows her nose gets little clear colored mucous and some mucous when she coughs.   Pt denies no sinus pain.   No fever, no chills or sweats.  Some chest congestion with the cough.  Pt took one claritin the other day but no other tabs. That was 5 days. ago    Review of Systems  Constitutional: Negative for fever, chills and fatigue.  HENT: Positive for congestion, rhinorrhea and sinus pressure. Negative for postnasal drip, sore throat and trouble swallowing.        Usually does feel pnd.  Respiratory: Positive for cough. Negative for chest tightness, shortness of breath and wheezing.   Cardiovascular: Negative for chest pain and palpitations.  Musculoskeletal: Negative for back pain.  Neurological: Negative for dizziness and headaches.  Hematological: Negative for adenopathy. Does not bruise/bleed easily.  Psychiatric/Behavioral: Negative for behavioral problems and confusion.    Past Medical History  Diagnosis Date  . Osteoporosis   . Osteoarthritis     h/o back pain, sees ortho s/p shots  . Microhematuria     s/p eval by urology 2008: U/S, CT, cysto w/ neg Bx (dx w/ a renal cyst, see reports)  . Cataract   . Eustachian tube dysfunction     Right side    History   Social History  . Marital Status: Married    Spouse Name: N/A  . Number of Children: 0  . Years of Education: N/A   Occupational History  . retired    Social History Main Topics  . Smoking status: Former Research scientist (life sciences)  . Smokeless tobacco: Never Used  . Alcohol Use: Yes     Comment: socially -wine  . Drug Use: No  . Sexual Activity: Not on file   Other Topics Concern  . Not on file   Social History Narrative     Household -- lives by herself in a townhouse    Emergency contact-- [name] (sister in law) lives in [place] --[phone]   She is her health POA    Past Surgical History  Procedure Laterality Date  . Appendectomy    . Abdominal hysterectomy      no oophorectomy per pt  . Tonsillectomy    . Dilation and curettage of uterus      Family History  Problem Relation Age of Onset  . Colon cancer Neg Hx   . Breast cancer Neg Hx   . Heart disease Father   . Heart disease Mother     Allergies  Allergen Reactions  . Astelin [Azelastine Hcl] Palpitations    Current Outpatient Prescriptions on File Prior to Visit  Medication Sig Dispense Refill  . Calcium Carbonate (CALTRATE 600 PO) Take by mouth.      . FIBER PO Take by mouth.      . fluticasone (FLONASE) 50 MCG/ACT nasal spray Place 2 sprays into both nostrils daily.    Marland Kitchen omeprazole (PRILOSEC) 40 MG capsule Take 1 capsule (40 mg total) by mouth daily.    . Multiple Vitamins-Minerals (CENTRUM SILVER) CHEW Chew by mouth.       No current facility-administered medications on file prior to visit.  BP 149/66 mmHg  Pulse 75  Temp(Src) 98.6 F (37 C) (Oral)  Ht 5\' 5"  (1.651 m)  Wt 153 lb 12.8 oz (69.763 kg)  BMI 25.59 kg/m2  SpO2 95%       Objective:   Physical Exam  General  Mental Status - Alert. General Appearance - Well groomed. Not in acute distress.  Skin Rashes- No Rashes.  HEENT Head- Normal. Ear Auditory Canal - Left- Normal. Right - Normal.Tympanic Membrane- Left- Normal. Right- Normal. Eye Sclera/Conjunctiva- Left- Normal. Right- Normal. Nose & Sinuses Nasal Mucosa- Left-  Boggy and Congested. Right-  Boggy and  Congested.Bilateral  No maxillary and no  frontal sinus pressure. Mouth & Throat Lips: Upper Lip- Normal: no dryness, cracking, pallor, cyanosis, or vesicular eruption. Lower Lip-Normal: no dryness, cracking, pallor, cyanosis or vesicular eruption. Buccal Mucosa- Bilateral- No Aphthous  ulcers. Oropharynx- No Discharge or Erythema. +pnd Tonsils: Characteristics- Bilateral- No Erythema or Congestion. Size/Enlargement- Bilateral- No enlargement. Discharge- bilateral-None.  Neck Neck- Supple. No Masses.   Chest and Lung Exam Auscultation: Breath Sounds:-even and unlabored. But faint rough breath sounds mid aspect of left lung field.  Cardiovascular Auscultation:Rythm- Regular, rate and rhythm. Murmurs & Other Heart Sounds:Ausculatation of the heart reveal- No Murmurs.  Lymphatic Head & Neck General Head & Neck Lymphatics: Bilateral: Description- No Localized lymphadenopathy.       Assessment & Plan:  By exam and hx possible allergic rhinitis. Continue flonase and take claritin daily.  Your cough likely from above pnd. Rx benzonatate(use if needed) But by lung exam faint rough breath sounds left side. Will get cxr now. rx azithromycin in event cxr + or if clinicaly worsening over weekend as explained.  Follow up 7 days or as needed

## 2015-01-17 NOTE — Progress Notes (Signed)
Pre visit review using our clinic review tool, if applicable. No additional management support is needed unless otherwise documented below in the visit note. 

## 2015-01-17 NOTE — Patient Instructions (Signed)
   By exam and hx possible allergic rhinitis. Continue flonase and take claritin daily.  Your cough likely from above pnd. Rx benzonatate(use if needed) But by lung exam faint rough breath sounds left side. Will get cxr now. rx azithromycin in event cxr + or if clinicaly worsening over weekend as explained.  Follow up 7 days or as needed

## 2015-01-23 ENCOUNTER — Ambulatory Visit (INDEPENDENT_AMBULATORY_CARE_PROVIDER_SITE_OTHER): Payer: Medicare Other | Admitting: Medical

## 2015-01-23 ENCOUNTER — Ambulatory Visit: Payer: Medicare Other | Admitting: Medical

## 2015-01-23 ENCOUNTER — Telehealth: Payer: Self-pay | Admitting: Internal Medicine

## 2015-01-23 ENCOUNTER — Encounter: Payer: Self-pay | Admitting: Medical

## 2015-01-23 VITALS — BP 129/76 | HR 78 | Temp 98.1°F | Ht 60.5 in | Wt 153.6 lb

## 2015-01-23 DIAGNOSIS — J3089 Other allergic rhinitis: Secondary | ICD-10-CM

## 2015-01-23 DIAGNOSIS — R059 Cough, unspecified: Secondary | ICD-10-CM

## 2015-01-23 DIAGNOSIS — R05 Cough: Secondary | ICD-10-CM | POA: Diagnosis not present

## 2015-01-23 DIAGNOSIS — L989 Disorder of the skin and subcutaneous tissue, unspecified: Secondary | ICD-10-CM | POA: Diagnosis not present

## 2015-01-23 MED ORDER — MONTELUKAST SODIUM 10 MG PO TABS: 10.0000 mg | ORAL_TABLET | Freq: Every day | ORAL | Status: DC

## 2015-01-23 NOTE — Telephone Encounter (Signed)
Please send referral to:  Dr. Rosendo Gros, [facility], [address], [place]

## 2015-01-23 NOTE — Progress Notes (Signed)
Pre visit review using our clinic review tool, if applicable. No additional management support is needed unless otherwise documented below in the visit note. 

## 2015-01-23 NOTE — Progress Notes (Signed)
Subjective:    Patient ID: Kimberly Rodriguez, female    DOB: 1927/04/10, 81 y.o.   MRN: [id number]  HPI  Pt in for follow up. Pt states she is improved from prior illness. Pt cough is improved but still rare occasional mild cough. Pt states feels some pnd. Pt gets little bit runny nose. Mild occasional itchy eyes. Pt is taking generic form of claritin.  Pt did seem to improve with antibiotic I prescribed. I reviewed xray report and misread. I thought radiologist report superimposed acute abnormality rather than without. Pt did seem to get better with zpack.   Also raised area left popliteal area lesion that she noticed day after I saw her. No pain. No dc.   Rt forearm. 2 days ago. Mild itching to mid forearm. No pain to touch. No fever or chills. Pt thought poison ivy or mosquito bite.  Review of Systems  Constitutional: Negative for fever, chills and fatigue.  HENT: Positive for postnasal drip and rhinorrhea. Negative for congestion, drooling, sneezing, sore throat and trouble swallowing.   Eyes: Positive for itching.  Respiratory: Negative for cough, chest tightness, shortness of breath and wheezing.   Cardiovascular: Negative for chest pain and palpitations.  Skin: Positive for rash.  Hematological: Negative for adenopathy. Does not bruise/bleed easily.   Past Medical History  Diagnosis Date  . Osteoporosis   . Osteoarthritis     h/o back pain, sees ortho s/p shots  . Microhematuria     s/p eval by urology 2008: U/S, CT, cysto w/ neg Bx (dx w/ a renal cyst, see reports)  . Cataract   . Eustachian tube dysfunction     Right side    History   Social History  . Marital Status: Married    Spouse Name: N/A  . Number of Children: 0  . Years of Education: N/A   Occupational History  . retired    Social History Main Topics  . Smoking status: Former Research scientist (life sciences)  . Smokeless tobacco: Never Used  . Alcohol Use: Yes     Comment: socially -wine  . Drug Use: No  . Sexual Activity:  Not on file   Other Topics Concern  . Not on file   Social History Narrative   Household -- lives by herself in a townhouse    Emergency contact-- [name] (sister in law) lives in [place] --[phone]   She is her health POA    Past Surgical History  Procedure Laterality Date  . Appendectomy    . Abdominal hysterectomy      no oophorectomy per pt  . Tonsillectomy    . Dilation and curettage of uterus      Family History  Problem Relation Age of Onset  . Colon cancer Neg Hx   . Breast cancer Neg Hx   . Heart disease Father   . Heart disease Mother     Allergies  Allergen Reactions  . Astelin [Azelastine Hcl] Palpitations    Current Outpatient Prescriptions on File Prior to Visit  Medication Sig Dispense Refill  . benzonatate (TESSALON) 100 MG capsule Take 1 capsule (100 mg total) by mouth 3 (three) times daily as needed for cough. 30 capsule 0  . Calcium Carbonate (CALTRATE 600 PO) Take by mouth.      . FIBER PO Take by mouth.      . fluticasone (FLONASE) 50 MCG/ACT nasal spray Place 2 sprays into both nostrils daily.    . Multiple Vitamins-Minerals (CENTRUM SILVER)  CHEW Chew by mouth.      Marland Kitchen omeprazole (PRILOSEC) 40 MG capsule Take 1 capsule (40 mg total) by mouth daily.    Marland Kitchen azithromycin (ZITHROMAX) 250 MG tablet Take 2 tablets by mouth on day 1, followed by 1 tablet by mouth daily for 4 days. (Patient not taking: Reported on 01/23/2015) 6 tablet 0   No current facility-administered medications on file prior to visit.    BP 129/76 mmHg  Pulse 78  Temp(Src) 98.1 F (36.7 C) (Oral)  Ht 5' 0.5" (1.537 m)  Wt 153 lb 9.6 oz (69.673 kg)  BMI 29.49 kg/m2  SpO2 97%       Objective:   Physical Exam  General  Mental Status - Alert. General Appearance - Well groomed. Not in acute distress.  Skin Rashes- No Rashes.  HEENT Head- Normal. Ear Auditory Canal - Left- Normal. Right - Normal.Tympanic Membrane- Left- Normal. Right- Normal. Eye Sclera/Conjunctiva- Left-  Normal. Right- Normal. Nose & Sinuses Nasal Mucosa- Left-  Boggy and Congested. Right-  Boggy and  Congested.Bilateral no  maxillary and no  frontal sinus pressure. Mouth & Throat Lips: Upper Lip- Normal: no dryness, cracking, pallor, cyanosis, or vesicular eruption. Lower Lip-Normal: no dryness, cracking, pallor, cyanosis or vesicular eruption. Buccal Mucosa- Bilateral- No Aphthous ulcers. Oropharynx- No Discharge or Erythema. +pnd Tonsils: Characteristics- Bilateral- No Erythema or Congestion. Size/Enlargement- Bilateral- No enlargement. Discharge- bilateral-None.  Neck Neck- Supple. No Masses.   Chest and Lung Exam Auscultation: Breath Sounds:-Clear even and unlabored.  Cardiovascular Auscultation:Rythm- Regular, rate and rhythm. Murmurs & Other Heart Sounds:Ausculatation of the heart reveal- No Murmurs.  Lymphatic Head & Neck General Head & Neck Lymphatics: Bilateral: Description- No Localized lymphadenopathy.  Skin- left popliteal area raised 10 mm sized lesion. Uniform in color but central scab  Rt forearm- small papular lesion with faint surround pink area.(this area not warm to touch, no tender). Getting better per pt.       Assessment & Plan:  Your cough seems to be allergy related. Continue the flonase and claritin. Will add montelukast today.  On review of cxr and listening to your lungs I don't think repeat xray needed.  For your skin lesion left popliteal area will refer to dermatologist.   For the area on rt forearm you could apply hydrocortisone one time a day. And neosporin one time a day as well.If are worsens or changes let us know. Will get dermatologit to review this area as well.

## 2015-01-23 NOTE — Patient Instructions (Addendum)
Your cough seems to be allergy related. Continue the flonase and claritin. Will add montelukast today.  On review of cxr and listening to your lungs I don't think  repeat xray needed.  For your skin lesion left popliteal area will refer to dermatologist.   For the area on rt forearm you could apply hydrocortisone one time a day. And neosporin one time a day as well.If are worsens or changes let us know. Will get dermatologit to review this area as well.  Follow up as regularly scheduled pcp or as needed

## 2015-02-27 ENCOUNTER — Other Ambulatory Visit: Payer: Self-pay

## 2015-03-03 ENCOUNTER — Other Ambulatory Visit: Payer: Self-pay | Admitting: Internal Medicine

## 2015-03-04 ENCOUNTER — Other Ambulatory Visit: Payer: Self-pay

## 2015-03-04 NOTE — Telephone Encounter (Signed)
Pt would like the Singular to be sent to [facility] as a 30 day supply with three refills. Pt has appt in October with Dr Larose Kells. Would you want to refill Rx this time and then get Dr Larose Kells to fill the Rx? Please advise.

## 2015-03-05 MED ORDER — MONTELUKAST SODIUM 10 MG PO TABS: 10.0000 mg | ORAL_TABLET | Freq: Every day | ORAL | Status: DC

## 2015-03-05 NOTE — Telephone Encounter (Signed)
rx refill singulair

## 2015-03-10 DIAGNOSIS — R0982 Postnasal drip: Secondary | ICD-10-CM | POA: Diagnosis not present

## 2015-03-10 DIAGNOSIS — H6980 Other specified disorders of Eustachian tube, unspecified ear: Secondary | ICD-10-CM | POA: Diagnosis not present

## 2015-03-17 ENCOUNTER — Other Ambulatory Visit: Payer: Self-pay

## 2015-04-01 ENCOUNTER — Encounter: Payer: Self-pay | Admitting: Internal Medicine

## 2015-04-01 ENCOUNTER — Ambulatory Visit (INDEPENDENT_AMBULATORY_CARE_PROVIDER_SITE_OTHER): Payer: Medicare Other | Admitting: Internal Medicine

## 2015-04-01 VITALS — BP 118/76 | HR 72 | Temp 98.0°F | Ht 61.0 in | Wt 151.2 lb

## 2015-04-01 DIAGNOSIS — Z09 Encounter for follow-up examination after completed treatment for conditions other than malignant neoplasm: Secondary | ICD-10-CM | POA: Insufficient documentation

## 2015-04-01 DIAGNOSIS — M810 Age-related osteoporosis without current pathological fracture: Secondary | ICD-10-CM

## 2015-04-01 DIAGNOSIS — Z0000 Encounter for general adult medical examination without abnormal findings: Secondary | ICD-10-CM | POA: Diagnosis not present

## 2015-04-01 DIAGNOSIS — Z23 Encounter for immunization: Secondary | ICD-10-CM

## 2015-04-01 NOTE — Progress Notes (Signed)
Pre visit review using our clinic review tool, if applicable. No additional management support is needed unless otherwise documented below in the visit note. 

## 2015-04-01 NOTE — Assessment & Plan Note (Addendum)
Td  2012, pneumonia shot, 2006; prevnar--2015; had a shingles shot already;  flu shot today  No further cervical  Breast or colon cancer screening   Diet and exercise discussed  Labs reviewed: Not due for any at this time. RTC 6-8 months

## 2015-04-01 NOTE — Assessment & Plan Note (Signed)
Interstitial lung disease, cough; She was seen with acute symptoms twice in the last 3 months, now cough is almost resolved. She has minimal obstruction PFTs 08-2014, no GERD; Plan: Discontinue Singulair, continue with Flonase. Has not been taking PPIs Osteoporosis; Currently on Fosamax,last bone density test 2012, rx  DEXA Continue calcium and vitamin D supplements RTC 8 months

## 2015-04-01 NOTE — Patient Instructions (Signed)
We'll schedule a bone density test  Please come back in 6-8 months for a  routine checkup  Fall Prevention and Home Safety Falls cause injuries and can affect all age groups. It is possible to use preventive measures to significantly decrease the likelihood of falls. There are many simple measures which can make your home safer and prevent falls. OUTDOORS  Repair cracks and edges of walkways and driveways.  Remove high doorway thresholds.  Trim shrubbery on the main path into your home.  Have good outside lighting.  Clear walkways of tools, rocks, debris, and clutter.  Check that handrails are not broken and are securely fastened. Both sides of steps should have handrails.  Have leaves, snow, and ice cleared regularly.  Use sand or salt on walkways during winter months.  In the garage, clean up grease or oil spills. BATHROOM  Install night lights.  Install grab bars by the toilet and in the tub and shower.  Use non-skid mats or decals in the tub or shower.  Place a plastic non-slip stool in the shower to sit on, if needed.  Keep floors dry and clean up all water on the floor immediately.  Remove soap buildup in the tub or shower on a regular basis.  Secure bath mats with non-slip, double-sided rug tape.  Remove throw rugs and tripping hazards from the floors. BEDROOMS  Install night lights.  Make sure a bedside light is easy to reach.  Do not use oversized bedding.  Keep a telephone by your bedside.  Have a firm chair with side arms to use for getting dressed.  Remove throw rugs and tripping hazards from the floor. KITCHEN  Keep handles on pots and pans turned toward the center of the stove. Use back burners when possible.  Clean up spills quickly and allow time for drying.  Avoid walking on wet floors.  Avoid hot utensils and knives.  Position shelves so they are not too high or low.  Place commonly used objects within easy reach.  If necessary,  use a sturdy step stool with a grab bar when reaching.  Keep electrical cables out of the way.  Do not use floor polish or wax that makes floors slippery. If you must use wax, use non-skid floor wax.  Remove throw rugs and tripping hazards from the floor. STAIRWAYS  Never leave objects on stairs.  Place handrails on both sides of stairways and use them. Fix any loose handrails. Make sure handrails on both sides of the stairways are as long as the stairs.  Check carpeting to make sure it is firmly attached along stairs. Make repairs to worn or loose carpet promptly.  Avoid placing throw rugs at the top or bottom of stairways, or properly secure the rug with carpet tape to prevent slippage. Get rid of throw rugs, if possible.  Have an electrician put in a light switch at the top and bottom of the stairs. OTHER FALL PREVENTION TIPS  Wear low-heel or rubber-soled shoes that are supportive and fit well. Wear closed toe shoes.  When using a stepladder, make sure it is fully opened and both spreaders are firmly locked. Do not climb a closed stepladder.  Add color or contrast paint or tape to grab bars and handrails in your home. Place contrasting color strips on first and last steps.  Learn and use mobility aids as needed. Install an electrical emergency response system.  Turn on lights to avoid dark areas. Replace light bulbs that burn out  immediately. Get light switches that glow.  Arrange furniture to create clear pathways. Keep furniture in the same place.  Firmly attach carpet with non-skid or double-sided tape.  Eliminate uneven floor surfaces.  Select a carpet pattern that does not visually hide the edge of steps.  Be aware of all pets. OTHER HOME SAFETY TIPS  Set the water temperature for 120 F (48.8 C).  Keep emergency numbers on or near the telephone.  Keep smoke detectors on every level of the home and near sleeping areas. Document Released: 05/28/2002 Document  Revised: 12/07/2011 Document Reviewed: 08/27/2011 [facility] Patient Information 2015 [place], [place]. This information is not intended to replace advice given to you by your health care provider. Make sure you discuss any questions you have with your health care provider.   Preventive Care for Adults Ages 19 and over  Blood pressure check.** / Every 1 to 2 years.  Lipid and cholesterol check.**/ Every 5 years beginning at age 20.  Lung cancer screening. / Every year if you are aged 33-80 years and have a 30-pack-year history of smoking and currently smoke or have quit within the past 15 years. Yearly screening is stopped once you have quit smoking for at least 15 years or develop a health problem that would prevent you from having lung cancer treatment.  Fecal occult blood test (FOBT) of stool. / Every year beginning at age 79 and continuing until age 68. You may not have to do this test if you get a colonoscopy every 10 years.  Flexible sigmoidoscopy** or colonoscopy.** / Every 5 years for a flexible sigmoidoscopy or every 10 years for a colonoscopy beginning at age 41 and continuing until age 95.  Hepatitis C blood test.** / For all people born from [birth year] through 1965 and any individual with known risks for hepatitis C.  Abdominal aortic aneurysm (AAA) screening.** / A one-time screening for ages 65 to 75 years who are current or former smokers.  Skin self-exam. / Monthly.  Influenza vaccine. / Every year.  Tetanus, diphtheria, and acellular pertussis (Tdap/Td) vaccine.** / 1 dose of Td every 10 years.  Varicella vaccine.** / Consult your health care provider.  Zoster vaccine.** / 1 dose for adults aged 16 years or older.  Pneumococcal 13-valent conjugate (PCV13) vaccine.** / Consult your health care provider.  Pneumococcal polysaccharide (PPSV23) vaccine.** / 1 dose for all adults aged 15 years and older.  Meningococcal vaccine.** / Consult your health care provider.  Hepatitis  A vaccine.** / Consult your health care provider.  Hepatitis B vaccine.** / Consult your health care provider.  Haemophilus influenzae type b (Hib) vaccine.** / Consult your health care provider. **Family history and personal history of risk and conditions may change your health care provider's recommendations. Document Released: 08/03/2001 Document Revised: 06/12/2013 Document Reviewed: 11/02/2010 [facility] Patient Information 2015 [place], [place]. This information is not intended to replace advice given to you by your health care provider. Make sure you discuss any questions you have with your health care provider.

## 2015-04-01 NOTE — Progress Notes (Signed)
Subjective:    Patient ID: Kimberly Rodriguez, female    DOB: 1926/08/30, 81 y.o.   MRN: [id number]  DOS:  04/01/2015  Type of visit - description :    Here for Medicare AWV: 1. Risk factors based on Past M, S, F history: reviewed 2. Physical Activities:  Home chores, takes occ walks    3. Depression/mood:  neg screen  4. Hearing: decreased on the R x a while, stable, declined a hearing aids in the past 5. ADL's:  Independent  , drives   6. Fall Risk: prevention discussed    7. home Safety: does feel safe at home   8. Height, weight, &visual acuity: see VS, no problems w/ vision, sees eye doctor yearly 9. Counseling: provided 10. Labs ordered based on risk factors: if needed   11. Referral Coordination: if needed 12.  Care Plan, see assessment and plan   13.   Cognitive Assessment:motor skills and cognition appropriate for age  35. Providers list updated  15. End of life care: has a HC-POA  In addition, today we discussed the following:  osteoporosis: Good compliance with medicines Cough: Was seen with cough twice in the last 3 mnths, prescribed Zithromax and eventually allergy medications incuding Singulair. Cough has significantly decreased. Now coughs very rarely, occasionally sees clear sputum. No hemoptysis    Review of Systems  Constitutional: No fever. No chills. No unexplained wt changes. No unusual sweats  HEENT: No dental problems, no ear discharge, no facial swelling, no voice changes. No eye discharge, no eye  redness , no  intolerance to light  No major problems with sinus discharge or congestion  Respiratory: No wheezing , no  difficulty breathing. No cough , no mucus production  Cardiovascular: No CP, no leg swelling , no  Palpitations. No difficulty breathing with activities of daily living  GI: no nausea, no vomiting, no diarrhea , no  abdominal pain.  No blood in the stools. No dysphagia, no odynophagia. No heartburn    Endocrine: No polyphagia, no polyuria  , no polydipsia  GU: No dysuria, gross hematuria, difficulty urinating. No urinary urgency, no frequency.  Musculoskeletal: No joint swellings or unusual aches or pains  Skin: No change in the color of the skin, palor , no  Rash  Allergic, immunologic: No environmental allergies , no  food allergies  Neurological: No dizziness no  syncope. No headaches. No diplopia, no slurred, no slurred speech, no motor deficits, no facial  Numbness  Hematological: No enlarged lymph nodes, no easy bruising , no unusual bleedings  Psychiatry: No suicidal ideas, no hallucinations, no beavior problems, no confusion.   No unusual/severe anxiety, no depression  Past Medical History  Diagnosis Date  . Osteoporosis   . Osteoarthritis     h/o back pain, sees ortho s/p shots  . Microhematuria     s/p eval by urology 2008: U/S, CT, cysto w/ neg Bx (dx w/ a renal cyst, see reports)  . Cataract   . Eustachian tube dysfunction     Right side, Dr. Laurance Flatten, ENT    Past Surgical History  Procedure Laterality Date  . Appendectomy    . Abdominal hysterectomy      no oophorectomy per pt  . Tonsillectomy    . Dilation and curettage of uterus      Social History   Social History  . Marital Status: Married    Spouse Name: N/A  . Number of Children: 0  . Years of Education:  N/A   Occupational History  . retired    Social History Main Topics  . Smoking status: Former Research scientist (life sciences)  . Smokeless tobacco: Never Used  . Alcohol Use: Yes     Comment: socially -wine  . Drug Use: No  . Sexual Activity: Not on file   Other Topics Concern  . Not on file   Social History Narrative   Household -- lives by herself in a townhouse    Emergency contact-- [name] (sister in law) lives in [place] --[phone]   She is her health POA   Family History  Problem Relation Age of Onset  . Colon cancer Neg Hx   . Breast cancer Neg Hx   . Heart disease Father   . Heart disease Mother          Medication List         This list is accurate as of: 04/01/15  5:22 PM.  Always use your most recent med list.               alendronate 70 MG tablet  Commonly known as:  FOSAMAX  Take 1 tablet (70 mg total) by mouth once a week. Take on an empty stomach with a full glass of water.     CALTRATE 600 PO  Take by mouth.     CENTRUM SILVER Chew  Chew by mouth.     FIBER PO  Take by mouth.     fluticasone 50 MCG/ACT nasal spray  Commonly known as:  FLONASE  Place 2 sprays into both nostrils daily.           Objective:   Physical Exam BP 118/76 mmHg  Pulse 72  Temp(Src) 98 F (36.7 C) (Oral)  Ht 5\' 1"  (1.549 m)  Wt 151 lb 4 oz (68.607 kg)  BMI 28.59 kg/m2  SpO2 95% General:   Well developed, well nourished . NAD.  Neck:  no thyromegaly HEENT:  Normocephalic . Face symmetric, atraumatic Nose not congested. Lungs:  CTA B Normal respiratory effort, no intercostal retractions, no accessory muscle use. Heart: RRR,  no murmur.  No pretibial edema bilaterally  Abdomen:  Not distended, soft, non-tender. No rebound or rigidity.   Skin: Exposed areas without rash. Not pale. Not jaundice Neurologic:  alert & oriented X3.  Speech normal, gait appropriate for age and unassisted Strength symmetric and appropriate for age.  Psych: Cognition and judgment appear intact.  Cooperative with normal attention span and concentration.  Behavior appropriate. No anxious or depressed appearing.    Assessment & Plan:    Assessment> Anxiety interstitial ln disease per chest x-ray 08-2014, PFTs 08-2014: minimal obstruction DJD Back pain: local injections before Osteoporosis Microhematuria  s/p eval by urology 2008: U/S, CT, cysto w/ neg Bx (dx w/ a renal cyst, see reports) ET dysfuntion saw ENT Dr Laurance Flatten 2016  Plan: Interstitial lung disease, cough; She was seen with acute symptoms twice in the last 3 months, now cough is almost resolved. She has minimal obstruction PFTs 08-2014, no GERD; Plan:  Discontinue Singulair, continue with Flonase. Has not been taking PPIs Osteoporosis; Currently on Fosamax,last bone density test 2012, rx  DEXA Continue calcium and vitamin D supplements RTC 8 months

## 2015-04-23 ENCOUNTER — Other Ambulatory Visit: Payer: Self-pay | Admitting: Medical

## 2015-05-01 DIAGNOSIS — H2512 Age-related nuclear cataract, left eye: Secondary | ICD-10-CM | POA: Diagnosis not present

## 2015-05-01 DIAGNOSIS — H25012 Cortical age-related cataract, left eye: Secondary | ICD-10-CM | POA: Diagnosis not present

## 2015-05-21 ENCOUNTER — Ambulatory Visit
Admission: RE | Admit: 2015-05-21 | Discharge: 2015-05-21 | Disposition: A | Discharge status: Home / Self Care | Payer: Medicare Other | Source: Ambulatory Visit | Attending: Internal Medicine | Admitting: Internal Medicine

## 2015-05-21 DIAGNOSIS — M810 Age-related osteoporosis without current pathological fracture: Secondary | ICD-10-CM | POA: Diagnosis not present

## 2015-05-27 ENCOUNTER — Telehealth: Payer: Self-pay

## 2015-05-27 NOTE — Telephone Encounter (Signed)
Prolia insurance verification started online. Request Form sent for scanning. Awaiting insurance determination.

## 2015-06-02 NOTE — Telephone Encounter (Signed)
Received Summary of Benefits from Pt's insurance. "Admin, Prolia and office visit, if billed, are subject to a 20 % co-insurance, up to a $4900 out of pocket max ($ 397.11 met). Once out of pocket is met, coverage increases to 100 % of the contracted rate. No deductible applies. No PA required." Summary of benefits sent to scanning, and letter printed and mailed to Pt regarding bone density test results. Informed her to let us know if she is interested in starting Prolia.

## 2015-07-01 ENCOUNTER — Telehealth: Payer: Self-pay | Admitting: Internal Medicine

## 2015-07-01 NOTE — Telephone Encounter (Signed)
Pt wanting to start Prolia, insurance verification already completed, see telephone note dated 05/27/2015. Can you order Prolia for Pt? Thank you.

## 2015-07-01 NOTE — Telephone Encounter (Signed)
Caller name: Self  Can be reached: [phone]   Reason for call: Patient called to inform Dr. Larose Kells that she would like to start taking Prolia injections. States Dr. Larose Kells offered this on last OV

## 2015-07-02 NOTE — Telephone Encounter (Signed)
Kaylyn-- I ordered this onm 07/01/15, hopefully it will come in by tomorrow.

## 2015-07-03 ENCOUNTER — Ambulatory Visit (INDEPENDENT_AMBULATORY_CARE_PROVIDER_SITE_OTHER): Payer: Medicare Other | Admitting: Behavioral Health

## 2015-07-03 DIAGNOSIS — M810 Age-related osteoporosis without current pathological fracture: Secondary | ICD-10-CM | POA: Diagnosis not present

## 2015-07-03 MED ORDER — DENOSUMAB 60 MG/ML ~~LOC~~ SOLN
60.0000 mg | Freq: Once | SUBCUTANEOUS | Status: AC
  Administered 2015-07-03: 60 mg via SUBCUTANEOUS

## 2015-07-03 NOTE — Progress Notes (Signed)
Pre visit review using our clinic review tool, if applicable. No additional management support is needed unless otherwise documented below in the visit note.  Patient tolerated injection well.  

## 2015-07-03 NOTE — Telephone Encounter (Signed)
Called patient to inform her of Prolia arriving and scheduled her to come in today @ 3:00

## 2015-07-03 NOTE — Telephone Encounter (Signed)
Pt's Prolia has arrived and is in fridge, please inform Pt that she can schedule nurse visit at her convenience to receive Prolia. Thank you.

## 2015-08-04 ENCOUNTER — Ambulatory Visit (HOSPITAL_BASED_OUTPATIENT_CLINIC_OR_DEPARTMENT_OTHER)
Admission: RE | Admit: 2015-08-04 | Discharge: 2015-08-04 | Disposition: A | Discharge status: Home / Self Care | Payer: Medicare Other | Source: Ambulatory Visit | Attending: Internal Medicine | Admitting: Internal Medicine

## 2015-08-04 ENCOUNTER — Ambulatory Visit (INDEPENDENT_AMBULATORY_CARE_PROVIDER_SITE_OTHER): Payer: Medicare Other | Admitting: Internal Medicine

## 2015-08-04 ENCOUNTER — Encounter: Payer: Self-pay | Admitting: Internal Medicine

## 2015-08-04 VITALS — BP 132/76 | HR 61 | Temp 97.6°F | Ht 61.0 in | Wt 156.1 lb

## 2015-08-04 DIAGNOSIS — W19XXXA Unspecified fall, initial encounter: Secondary | ICD-10-CM | POA: Insufficient documentation

## 2015-08-04 DIAGNOSIS — S8002XA Contusion of left knee, initial encounter: Secondary | ICD-10-CM

## 2015-08-04 DIAGNOSIS — M25562 Pain in left knee: Secondary | ICD-10-CM | POA: Insufficient documentation

## 2015-08-04 DIAGNOSIS — Z09 Encounter for follow-up examination after completed treatment for conditions other than malignant neoplasm: Secondary | ICD-10-CM

## 2015-08-04 NOTE — Assessment & Plan Note (Signed)
Accidental fall: Prevention discussed, use a cane Contusion: Knee, wrist, face: Check x-ray of the knee, Tylenol, ice, call if pain persists beyond 3 weeks.

## 2015-08-04 NOTE — Progress Notes (Signed)
Subjective:    Patient ID: Kimberly Rodriguez, female    DOB: 08/11/1926, 82 y.o.   MRN: [id number]  DOS:  08/04/2015 Type of visit - description : Acute visit Interval history: [place] was a nice day, she decided to take a walk, she was going back home, tripped and fall; when  she landed, she hurt her right wrist, left knee and left face. No open sores. Today the  area that hurt the  most is the knee. Not taking any medications specifically for her pain.   Review of Systems Denies loss consciousness, headache, back pain. She does have some neck pain which is her baseline. No chest pain, breathing is at baseline  Past Medical History  Diagnosis Date  . Osteoporosis   . Osteoarthritis     h/o back pain, sees ortho s/p shots  . Microhematuria     s/p eval by urology 2008: U/S, CT, cysto w/ neg Bx (dx w/ a renal cyst, see reports)  . Cataract   . Eustachian tube dysfunction     Right side, Dr. Laurance Flatten, ENT    Past Surgical History  Procedure Laterality Date  . Appendectomy    . Abdominal hysterectomy      no oophorectomy per pt  . Tonsillectomy    . Dilation and curettage of uterus      Social History   Social History  . Marital Status: Married    Spouse Name: N/A  . Number of Children: 0  . Years of Education: N/A   Occupational History  . retired    Social History Main Topics  . Smoking status: Former Research scientist (life sciences)  . Smokeless tobacco: Never Used  . Alcohol Use: Yes     Comment: socially -wine  . Drug Use: No  . Sexual Activity: Not on file   Other Topics Concern  . Not on file   Social History Narrative   Household -- lives by herself in a townhouse    Emergency contact-- [name] (sister in law) lives in [place] --[phone]   She is her health POA        Medication List       This list is accurate as of: 08/04/15  4:58 PM.  Always use your most recent med list.               alendronate 70 MG tablet  Commonly known as:  FOSAMAX  Take 1 tablet (70 mg  total) by mouth once a week. Take on an empty stomach with a full glass of water.     CALTRATE 600 PO  Take by mouth.     CENTRUM SILVER Chew  Chew by mouth.     denosumab 60 MG/ML Soln injection  Commonly known as:  PROLIA  Inject 60 mg into the skin every 6 (six) months. Administer in upper arm, thigh, or abdomen     FIBER PO  Take by mouth.     fluticasone 50 MCG/ACT nasal spray  Commonly known as:  FLONASE  Place 2 sprays into both nostrils daily.     montelukast 10 MG tablet  Commonly known as:  SINGULAIR  Take 1 tablet by mouth at  bedtime           Objective:   Physical Exam  HENT:  Head:     BP 132/76 mmHg  Pulse 61  Temp(Src) 97.6 F (36.4 C) (Oral)  Ht 5\' 1"  (1.549 m)  Wt 156 lb 2 oz (70.818  kg)  BMI 29.51 kg/m2  SpO2 98% General:   Well developed, well nourished . NAD.  HEENT:  Normocephalic . Face symmetric, see graphic MSK: Left wrist normal. Right wrist: Has a ecchymoses of the palmar aspect otherwise no deformities, no TTP, range of motion normal. NorR knee -- normal Left knee: Have a very superficial excoriation in the front, mild swelling at the antero-medial aspect with some TTP. Range of motion normal, normal weightbearing.  Skin: Not pale. Not jaundice Neurologic:  alert & oriented X3.  Speech normal, gait appropriate for age and unassisted, unchanged and she is able to put weight on her left leg.  Psych--  Cognition and judgment appear intact.  Cooperative with normal attention span and concentration.  Behavior appropriate. No anxious or depressed appearing.      Assessment & Plan:   Assessment> Anxiety interstitial ln disease per chest x-ray 08-2014, PFTs 08-2014: minimal obstruction DJD Back pain: local injections before Osteoporosis: T score 04-2015 -3.1, prolia #1 06-2015  Microhematuria  s/p eval by urology 2008: U/S, CT, cysto w/ neg Bx (dx w/ a renal cyst, see reports) ET dysfuntion saw ENT Dr Laurance Flatten  2016  PLAN Accidental fall: Prevention discussed, use a cane Contusion: Knee, wrist, face: Check x-ray of the knee, Tylenol, ice, call if pain persists beyond 3 weeks.

## 2015-08-04 NOTE — Progress Notes (Signed)
Pre visit review using our clinic review tool, if applicable. No additional management support is needed unless otherwise documented below in the visit note. 

## 2015-08-04 NOTE — Patient Instructions (Signed)
STOP BY THE FIRST FLOOR:  get the XR    Use Tylenol, applied ice to the left knee and wrist.  You may like to use a knee sleeve to help with pain   Call anytime if the pain persists beyond 3 weeks.  Use a cane when you take a walk   Fall Prevention in the [place] can cause injuries and can affect people from all age groups. There are many simple things that you can do to make your home safe and to help prevent falls. WHAT CAN I DO ON THE OUTSIDE OF MY HOME?  Regularly repair the edges of walkways and driveways and fix any cracks.  Remove high doorway thresholds.  Trim any shrubbery on the main path into your home.  Use bright outdoor lighting.  Clear walkways of debris and clutter, including tools and rocks.  Regularly check that handrails are securely fastened and in good repair. Both sides of any steps should have handrails.  Install guardrails along the edges of any raised decks or porches.  Have leaves, snow, and ice cleared regularly.  Use sand or salt on walkways during winter months.  In the garage, clean up any spills right away, including grease or oil spills. WHAT CAN I DO IN THE BATHROOM?  Use night lights.  Install grab bars by the toilet and in the tub and shower. Do not use towel bars as grab bars.  Use non-skid mats or decals on the floor of the tub or shower.  If you need to sit down while you are in the shower, use a plastic, non-slip stool.[place] Kitchen  Keep the floor dry. Immediately clean up any water that spills on the floor.  Remove soap buildup in the tub or shower on a regular basis.  Attach bath mats securely with double-sided non-slip rug tape.  Remove throw rugs and other tripping hazards from the floor. WHAT CAN I DO IN THE BEDROOM?  Use night lights.  Make sure that a bedside light is easy to reach.  Do not use oversized bedding that drapes onto the floor.  Have a firm chair that has side arms to use for getting dressed.  Remove  throw rugs and other tripping hazards from the floor. WHAT CAN I DO IN THE KITCHEN?   Clean up any spills right away.  Avoid walking on wet floors.  Place frequently used items in easy-to-reach places.  If you need to reach for something above you, use a sturdy step stool that has a grab bar.  Keep electrical cables out of the way.  Do not use floor polish or wax that makes floors slippery. If you have to use wax, make sure that it is non-skid floor wax.  Remove throw rugs and other tripping hazards from the floor. WHAT CAN I DO IN THE STAIRWAYS?  Do not leave any items on the stairs.  Make sure that there are handrails on both sides of the stairs. Fix handrails that are broken or loose. Make sure that handrails are as long as the stairways.  Check any carpeting to make sure that it is firmly attached to the stairs. Fix any carpet that is loose or worn.  Avoid having throw rugs at the top or bottom of stairways, or secure the rugs with carpet tape to prevent them from moving.  Make sure that you have a light switch at the top of the stairs and the bottom of the stairs. If you do not have  them, have them installed. WHAT ARE SOME OTHER FALL PREVENTION TIPS?  Wear closed-toe shoes that fit well and support your feet. Wear shoes that have rubber soles or low heels.  When you use a stepladder, make sure that it is completely opened and that the sides are firmly locked. Have someone hold the ladder while you are using it. Do not climb a closed stepladder.  Add color or contrast paint or tape to grab bars and handrails in your home. Place contrasting color strips on the first and last steps.  Use mobility aids as needed, such as canes, walkers, scooters, and crutches.  Turn on lights if it is dark. Replace any light bulbs that burn out.  Set up furniture so that there are clear paths. Keep the furniture in the same spot.  Fix any uneven floor surfaces.  Choose a carpet design that  does not hide the edge of steps of a stairway.  Be aware of any and all pets.  Review your medicines with your healthcare provider. Some medicines can cause dizziness or changes in blood pressure, which increase your risk of falling. Talk with your health care provider about other ways that you can decrease your risk of falls. This may include working with a physical therapist or trainer to improve your strength, balance, and endurance.   This information is not intended to replace advice given to you by your health care provider. Make sure you discuss any questions you have with your health care provider.   Document Released: 05/28/2002 Document Revised: 10/22/2014 Document Reviewed: 07/12/2014 Elsevier Interactive Patient Education [employer].

## 2015-10-02 ENCOUNTER — Other Ambulatory Visit: Payer: Self-pay

## 2015-10-02 ENCOUNTER — Telehealth: Payer: Self-pay

## 2015-10-02 NOTE — Telephone Encounter (Signed)
Pt is due for next Prolia on or after 12/01/2015. Re-verification initiated. Form sent for scanning.

## 2015-10-06 NOTE — Telephone Encounter (Signed)
Message sent to Tricia for Prolia order.  

## 2015-10-06 NOTE — Telephone Encounter (Signed)
Received Summary of benefits, Pt is responsible for 20 % cost of Prolia, $50 copay, and $50 administration fee. OOP max is $4900 ($218 met). No deductible applies. No PA needed. Summary of benefits sent for scanning.

## 2015-10-09 NOTE — Telephone Encounter (Signed)
Prolia received, placed in fridge.

## 2015-10-09 NOTE — Telephone Encounter (Signed)
Pt scheduled for routine follow-up on 12/02/2015 at 1100.

## 2015-10-16 ENCOUNTER — Other Ambulatory Visit: Payer: Self-pay | Admitting: Internal Medicine

## 2015-10-16 DIAGNOSIS — Z1231 Encounter for screening mammogram for malignant neoplasm of breast: Secondary | ICD-10-CM

## 2015-10-31 DIAGNOSIS — D485 Neoplasm of uncertain behavior of skin: Secondary | ICD-10-CM | POA: Diagnosis not present

## 2015-10-31 DIAGNOSIS — L578 Other skin changes due to chronic exposure to nonionizing radiation: Secondary | ICD-10-CM | POA: Diagnosis not present

## 2015-10-31 DIAGNOSIS — L821 Other seborrheic keratosis: Secondary | ICD-10-CM | POA: Diagnosis not present

## 2015-10-31 DIAGNOSIS — D2372 Other benign neoplasm of skin of left lower limb, including hip: Secondary | ICD-10-CM | POA: Diagnosis not present

## 2015-10-31 DIAGNOSIS — L299 Pruritus, unspecified: Secondary | ICD-10-CM | POA: Diagnosis not present

## 2015-11-18 ENCOUNTER — Ambulatory Visit (HOSPITAL_BASED_OUTPATIENT_CLINIC_OR_DEPARTMENT_OTHER)
Admission: RE | Admit: 2015-11-18 | Discharge: 2015-11-18 | Disposition: A | Discharge status: Home / Self Care | Payer: Medicare Other | Source: Ambulatory Visit | Attending: Internal Medicine | Admitting: Internal Medicine

## 2015-11-18 DIAGNOSIS — Z1231 Encounter for screening mammogram for malignant neoplasm of breast: Secondary | ICD-10-CM | POA: Insufficient documentation

## 2015-12-02 ENCOUNTER — Ambulatory Visit: Payer: Medicare Other | Admitting: Internal Medicine

## 2015-12-02 ENCOUNTER — Ambulatory Visit (INDEPENDENT_AMBULATORY_CARE_PROVIDER_SITE_OTHER): Payer: Medicare Other | Admitting: Internal Medicine

## 2015-12-02 ENCOUNTER — Encounter: Payer: Self-pay | Admitting: Internal Medicine

## 2015-12-02 ENCOUNTER — Ambulatory Visit: Payer: Medicare Other

## 2015-12-02 VITALS — BP 124/76 | HR 69 | Temp 98.0°F | Ht 61.0 in | Wt 158.4 lb

## 2015-12-02 DIAGNOSIS — M159 Polyosteoarthritis, unspecified: Secondary | ICD-10-CM

## 2015-12-02 DIAGNOSIS — J984 Other disorders of lung: Secondary | ICD-10-CM

## 2015-12-02 DIAGNOSIS — M150 Primary generalized (osteo)arthritis: Secondary | ICD-10-CM | POA: Diagnosis not present

## 2015-12-02 DIAGNOSIS — M810 Age-related osteoporosis without current pathological fracture: Secondary | ICD-10-CM

## 2015-12-02 MED ORDER — DENOSUMAB 60 MG/ML ~~LOC~~ SOLN
60.0000 mg | Freq: Once | SUBCUTANEOUS | Status: AC
  Administered 2015-12-02: 60 mg via SUBCUTANEOUS

## 2015-12-02 NOTE — Progress Notes (Signed)
Pre visit review using our clinic review tool, if applicable. No additional management support is needed unless otherwise documented below in the visit note. 

## 2015-12-02 NOTE — Progress Notes (Signed)
Subjective:    Patient ID: Kimberly Rodriguez, female    DOB: 1927/06/17, 82 y.o.   MRN: [id number]  DOS:  12/02/2015 Type of visit - description : rov Interval history: Osteoporosis: Due for a prolia today MSK: Continue with back and neck pain, at this point, then neck pain is more persisting, denies radiation or upper/lower extremity paresthesias. No difficulties with her gait.   Review of Systems Denies chest pain difficulty breathing or lower extremity edema. No cough  Past Medical History  Diagnosis Date  . Osteoporosis   . Osteoarthritis     h/o back pain, sees ortho s/p shots  . Microhematuria     s/p eval by urology 2008: U/S, CT, cysto w/ neg Bx (dx w/ a renal cyst, see reports)  . Cataract   . Eustachian tube dysfunction     Right side, Dr. Laurance Flatten, ENT    Past Surgical History  Procedure Laterality Date  . Appendectomy    . Abdominal hysterectomy      no oophorectomy per pt  . Tonsillectomy    . Dilation and curettage of uterus      Social History   Social History  . Marital Status: Married    Spouse Name: N/A  . Number of Children: 0  . Years of Education: N/A   Occupational History  . retired    Social History Main Topics  . Smoking status: Former Research scientist (life sciences)  . Smokeless tobacco: Never Used  . Alcohol Use: Yes     Comment: socially -wine  . Drug Use: No  . Sexual Activity: Not on file   Other Topics Concern  . Not on file   Social History Narrative   Household -- lives by herself in a townhouse    Emergency contact-- [name] (sister in law) lives in [place] --[phone]   She is her health POA        Medication List       This list is accurate as of: 12/02/15 11:59 PM.  Always use your most recent med list.               CALTRATE 600 PO  Take by mouth.     CENTRUM SILVER Chew  Chew by mouth.     denosumab 60 MG/ML Soln injection  Commonly known as:  PROLIA  Inject 60 mg into the skin every 6 (six) months. Administer in upper arm,  thigh, or abdomen     FIBER PO  Take by mouth.     fluticasone 50 MCG/ACT nasal spray  Commonly known as:  FLONASE  Place 2 sprays into both nostrils daily as needed.           Objective:   Physical Exam BP 124/76 mmHg  Pulse 69  Temp(Src) 98 F (36.7 C) (Oral)  Ht 5\' 1"  (1.549 m)  Wt 158 lb 6 oz (71.838 kg)  BMI 29.94 kg/m2  SpO2 96% General:   Well developed, well nourished . NAD.  HEENT:  Normocephalic . Face symmetric, atraumatic Neck: No TTP of the cervical spine, range of motion is slightly decreased. Lungs:  Slightly decreased breath sounds but otherwise normal Normal respiratory effort, no intercostal retractions, no accessory muscle use. Heart: RRR,  no murmur.  No pretibial edema bilaterally  Skin: Not pale. Not jaundice Neurologic:  alert & oriented X3.  Speech normal, gait appropriate for age and unassisted. DTRs and motor symmetric Psych--  Cognition and judgment appear intact.  Cooperative with normal  attention span and concentration.  Behavior appropriate. No anxious or depressed appearing.      Assessment & Plan:   Assessment Anxiety interstitial lung dz per chest x-ray 08-2014, PFTs 08-2014: minimal obstruction MSK: --DJD --Neck pain --Back pain: h/o acupuncture before Osteoporosis: T score 04-2015 -3.1, prolia #2  11-2015  Microhematuria  s/p eval by urology 2008: U/S, CT, cysto w/ neg Bx (dx w/ a renal cyst, see reports) ET dysfuntion saw ENT Dr Laurance Flatten 2016  PLAN Interstitial lung disease: ASX DJD: Current sx is  neck pain, exam is negative, no red flag symptoms, recommend Tylenol for now. Osteoporosis: Prolia  #2 today RT  4-5 months, CPX

## 2015-12-02 NOTE — Patient Instructions (Signed)
  GO TO THE FRONT DESK Schedule your next appointment for a physical exam, fasting in 4 to 5 months   Tylenol  500 mg OTC 2 tabs a day every 8 hours as needed for pain

## 2015-12-03 NOTE — Assessment & Plan Note (Signed)
Interstitial lung disease: ASX DJD: Current sx is  neck pain, exam is negative, no red flag symptoms, recommend Tylenol for now. Osteoporosis: Prolia  #2 today RT  4-5 months, CPX

## 2016-04-29 ENCOUNTER — Encounter: Payer: Self-pay | Admitting: Internal Medicine

## 2016-04-29 ENCOUNTER — Ambulatory Visit (INDEPENDENT_AMBULATORY_CARE_PROVIDER_SITE_OTHER): Payer: Medicare Other | Admitting: Internal Medicine

## 2016-04-29 VITALS — BP 132/78 | HR 75 | Temp 97.5°F | Resp 14 | Ht 61.0 in | Wt 154.2 lb

## 2016-04-29 DIAGNOSIS — Z0000 Encounter for general adult medical examination without abnormal findings: Secondary | ICD-10-CM | POA: Diagnosis not present

## 2016-04-29 DIAGNOSIS — Z23 Encounter for immunization: Secondary | ICD-10-CM | POA: Diagnosis not present

## 2016-04-29 LAB — CBC WITH DIFFERENTIAL/PLATELET
BASOS ABS: 0.1 10*3/uL (ref 0.0–0.1)
Basophils Relative: 0.9 % (ref 0.0–3.0)
Eosinophils Absolute: 0.2 10*3/uL (ref 0.0–0.7)
Eosinophils Relative: 3.1 % (ref 0.0–5.0)
HEMATOCRIT: 35.9 % — AB (ref 36.0–46.0)
HEMOGLOBIN: 11.6 g/dL — AB (ref 12.0–15.0)
LYMPHS PCT: 21.6 % (ref 12.0–46.0)
Lymphs Abs: 1.2 10*3/uL (ref 0.7–4.0)
MCHC: 32.2 g/dL (ref 30.0–36.0)
MCV: 84.5 fl (ref 78.0–100.0)
MONOS PCT: 12.4 % — AB (ref 3.0–12.0)
Monocytes Absolute: 0.7 10*3/uL (ref 0.1–1.0)
NEUTROS ABS: 3.6 10*3/uL (ref 1.4–7.7)
Neutrophils Relative %: 62 % (ref 43.0–77.0)
PLATELETS: 275 10*3/uL (ref 150.0–400.0)
RBC: 4.25 Mil/uL (ref 3.87–5.11)
RDW: 17.5 % — ABNORMAL HIGH (ref 11.5–15.5)
WBC: 5.7 10*3/uL (ref 4.0–10.5)

## 2016-04-29 LAB — BASIC METABOLIC PANEL
BUN: 19 mg/dL (ref 6–23)
CALCIUM: 9.6 mg/dL (ref 8.4–10.5)
CO2: 31 meq/L (ref 19–32)
CREATININE: 0.75 mg/dL (ref 0.40–1.20)
Chloride: 104 mEq/L (ref 96–112)
GFR: 77.66 mL/min (ref >60.00)
Glucose, Bld: 93 mg/dL (ref 70–99)
Potassium: 4.2 mEq/L (ref 3.5–5.1)
Sodium: 140 mEq/L (ref 135–145)

## 2016-04-29 LAB — LIPID PANEL
CHOL/HDL RATIO: 3
Cholesterol: 224 mg/dL — ABNORMAL HIGH (ref 0–200)
HDL: 80.3 mg/dL (ref >39.00)
LDL Cholesterol: 129 mg/dL — ABNORMAL HIGH (ref 0–99)
NonHDL: 144.18
Triglycerides: 75 mg/dL (ref 0.0–149.0)
VLDL: 15 mg/dL (ref 0.0–40.0)

## 2016-04-29 LAB — TSH: TSH: 2.39 u[IU]/mL (ref 0.35–4.50)

## 2016-04-29 NOTE — Assessment & Plan Note (Signed)
Td  2012, pneumonia shot, 2006; prevnar--2015; had a shingles shot already;  flu shot today  No further cervical  Breast or colon cancer screening   Diet and exercise discussed  Labs: BMP, FLP, CBC, TSH

## 2016-04-29 NOTE — Progress Notes (Signed)
Pre visit review using our clinic review tool, if applicable. No additional management support is needed unless otherwise documented below in the visit note. 

## 2016-04-29 NOTE — Progress Notes (Signed)
Subjective:    Patient ID: Kimberly Rodriguez, female    DOB: April 16, 1927, 82 y.o.   MRN: [id number]  DOS:  04/29/2016 Type of visit - description : cpx Interval history:No major concerns    Review of Systems Constitutional: No fever. No chills. No unexplained wt changes. No unusual sweats  HEENT: No dental problems, no ear discharge, no facial swelling, no voice changes. No eye discharge, no eye  redness , no  intolerance to light   Respiratory: No wheezing , no  difficulty breathing. No cough , no mucus production  Cardiovascular: No CP, no leg swelling , no  Palpitations  GI: no nausea, no vomiting, no diarrhea , no  abdominal pain.  No blood in the stools. No dysphagia, no odynophagia    Endocrine: No polyphagia, no polyuria , no polydipsia  GU: No dysuria, gross hematuria, difficulty urinating. No urinary urgency, no frequency.  Musculoskeletal: Anterior left knee pain on and off, mostly when she is sitting watching TV. No nocturnal pain. No pain with walking. No swelling. Symptoms started about the time she went on a trip September 2017 and did more walking than usual.  Skin: No change in the color of the skin, palor , no  Rash  Allergic, immunologic: No environmental allergies , no  food allergies  Neurological: No dizziness no  syncope. No headaches. No diplopia, no slurred, no slurred speech, no motor deficits, no facial  Numbness  Hematological: No enlarged lymph nodes, no easy bruising , no unusual bleedings  Psychiatry: No suicidal ideas, no hallucinations, no beavior problems, no confusion.  No unusual/severe anxiety, no depression   Past Medical History:  Diagnosis Date  . Cataract   . Eustachian tube dysfunction    Right side, Dr. Laurance Flatten, ENT  . Microhematuria    s/p eval by urology 2008: U/S, CT, cysto w/ neg Bx (dx w/ a renal cyst, see reports)  . Osteoarthritis    h/o back pain, sees ortho s/p shots  . Osteoporosis     Past Surgical History:    Procedure Laterality Date  . ABDOMINAL HYSTERECTOMY     no oophorectomy per pt  . APPENDECTOMY    . DILATION AND CURETTAGE OF UTERUS    . TONSILLECTOMY      Social History   Social History  . Marital status: Widowed    Spouse name: N/A  . Number of children: 0  . Years of education: N/A   Occupational History  . retired    Social History Main Topics  . Smoking status: Former Research scientist (life sciences)  . Smokeless tobacco: Never Used  . Alcohol use Yes     Comment: socially -wine  . Drug use: No  . Sexual activity: Not on file   Other Topics Concern  . Not on file   Social History Narrative   Household -- lives by herself in a townhouse    Emergency contact-- Kimberly Rodriguez (sister in law) lives in [place] --[phone]   She is her health POA     Family History  Problem Relation Age of Onset  . Heart disease Father   . Heart disease Mother   . Colon cancer Neg Hx   . Breast cancer Neg Hx   . Diabetes Neg Hx       Medication List       Accurate as of 04/29/16 11:59 PM. Always use your most recent med list.          CALTRATE 600 PO Take  by mouth.   CENTRUM SILVER Chew Chew by mouth.   denosumab 60 MG/ML Soln injection Commonly known as:  PROLIA Inject 60 mg into the skin every 6 (six) months. Administer in upper arm, thigh, or abdomen   FIBER PO Take by mouth.   fluticasone 50 MCG/ACT nasal spray Commonly known as:  FLONASE Place 2 sprays into both nostrils daily as needed.   STOOL SOFTENER PO Take by mouth.          Objective:   Physical Exam BP 132/78 (BP Location: Left Arm, Patient Position: Sitting, Cuff Size: Normal)   Pulse 75   Temp 97.5 F (36.4 C) (Oral)   Resp 14   Ht 5\' 1"  (1.549 m)   Wt 154 lb 4 oz (70 kg)   SpO2 98%   BMI 29.15 kg/m   General:   Well developed, well nourished . NAD.  Neck: No  thyromegaly  HEENT:  Normocephalic . Face symmetric, atraumatic Lungs:  CTA B Normal respiratory effort, no intercostal retractions, no accessory  muscle use. Heart: RRR,  no murmur.  No pretibial edema bilaterally  Abdomen:  Not distended, soft, non-tender. No rebound or rigidity.   MSK:   left knee: No obvious effusion, redness, warmness. Both knees have mild bony changes consistent with DJD. Skin: Exposed areas without rash. Not pale. Not jaundice Neurologic:  alert & oriented X3.  Speech normal, gait appropriate for age and unassisted Strength symmetric and appropriate for age.  Psych: Cognition and judgment appear intact.  Cooperative with normal attention span and concentration.  Behavior appropriate. No anxious or depressed appearing.    Assessment & Plan:    Assessment Anxiety interstitial lung dz per chest x-ray 08-2014, PFTs 08-2014: minimal obstruction MSK: --DJD --Neck pain --Back pain: h/o acupuncture before Osteoporosis: T score 04-2015 -3.1, prolia #2  11-2015  Microhematuria  s/p eval by urology 2008: U/S, CT, cysto w/ neg Bx (dx w/ a renal cyst, see reports) ET dysfuntion saw ENT Dr Laurance Flatten 2016  PLAN Interstitial lung disease: Remains asx DJD: Having some knee pain, recommend ice and Tylenol. To call if sx increase Osteoporosis: To have [place]  #3 by 05/2016, recheck a bone density test in 6 months. RTC one year

## 2016-04-29 NOTE — Patient Instructions (Signed)
Get your blood work before you leave    Next visit in one year  For the knee pain: Apply ice daily as needed, take Tylenol as needed.  If the pain is more intense or frequent, please let me know  You are due for your next [place] 05/2016      Fall Prevention and Home Safety Falls cause injuries and can affect all age groups. It is possible to use preventive measures to significantly decrease the likelihood of falls. There are many simple measures which can make your home safer and prevent falls. OUTDOORS  Repair cracks and edges of walkways and driveways.  Remove high doorway thresholds.  Trim shrubbery on the main path into your home.  Have good outside lighting.  Clear walkways of tools, rocks, debris, and clutter.  Check that handrails are not broken and are securely fastened. Both sides of steps should have handrails.  Have leaves, snow, and ice cleared regularly.  Use sand or salt on walkways during winter months.  In the garage, clean up grease or oil spills. BATHROOM  Install night lights.  Install grab bars by the toilet and in the tub and shower.  Use non-skid mats or decals in the tub or shower.  Place a plastic non-slip stool in the shower to sit on, if needed.  Keep floors dry and clean up all water on the floor immediately.  Remove soap buildup in the tub or shower on a regular basis.  Secure bath mats with non-slip, double-sided rug tape.  Remove throw rugs and tripping hazards from the floors. BEDROOMS  Install night lights.  Make sure a bedside light is easy to reach.  Do not use oversized bedding.  Keep a telephone by your bedside.  Have a firm chair with side arms to use for getting dressed.  Remove throw rugs and tripping hazards from the floor. KITCHEN  Keep handles on pots and pans turned toward the center of the stove. Use back burners when possible.  Clean up spills quickly and allow time for drying.  Avoid walking on wet  floors.  Avoid hot utensils and knives.  Position shelves so they are not too high or low.  Place commonly used objects within easy reach.  If necessary, use a sturdy step stool with a grab bar when reaching.  Keep electrical cables out of the way.  Do not use floor polish or wax that makes floors slippery. If you must use wax, use non-skid floor wax.  Remove throw rugs and tripping hazards from the floor. STAIRWAYS  Never leave objects on stairs.  Place handrails on both sides of stairways and use them. Fix any loose handrails. Make sure handrails on both sides of the stairways are as long as the stairs.  Check carpeting to make sure it is firmly attached along stairs. Make repairs to worn or loose carpet promptly.  Avoid placing throw rugs at the top or bottom of stairways, or properly secure the rug with carpet tape to prevent slippage. Get rid of throw rugs, if possible.  Have an electrician put in a light switch at the top and bottom of the stairs. OTHER FALL PREVENTION TIPS  Wear low-heel or rubber-soled shoes that are supportive and fit well. Wear closed toe shoes.  When using a stepladder, make sure it is fully opened and both spreaders are firmly locked. Do not climb a closed stepladder.  Add color or contrast paint or tape to grab bars and handrails in your home.  Place contrasting color strips on first and last steps.  Learn and use mobility aids as needed. Install an electrical emergency response system.  Turn on lights to avoid dark areas. Replace light bulbs that burn out immediately. Get light switches that glow.  Arrange furniture to create clear pathways. Keep furniture in the same place.  Firmly attach carpet with non-skid or double-sided tape.  Eliminate uneven floor surfaces.  Select a carpet pattern that does not visually hide the edge of steps.  Be aware of all pets. OTHER HOME SAFETY TIPS  Set the water temperature for 120 F (48.8 C).  Keep  emergency numbers on or near the telephone.  Keep smoke detectors on every level of the home and near sleeping areas. Document Released: 05/28/2002 Document Revised: 12/07/2011 Document Reviewed: 08/27/2011 [facility] Patient Information 2015 [place], [place]. This information is not intended to replace advice given to you by your health care provider. Make sure you discuss any questions you have with your health care provider.   Preventive Care for Adults Ages 74 and over  Blood pressure check.** / Every 1 to 2 years.  Lipid and cholesterol check.**/ Every 5 years beginning at age 78.  Lung cancer screening. / Every year if you are aged 6-80 years and have a 30-pack-year history of smoking and currently smoke or have quit within the past 15 years. Yearly screening is stopped once you have quit smoking for at least 15 years or develop a health problem that would prevent you from having lung cancer treatment.  Fecal occult blood test (FOBT) of stool. / Every year beginning at age 25 and continuing until age 12. You may not have to do this test if you get a colonoscopy every 10 years.  Flexible sigmoidoscopy** or colonoscopy.** / Every 5 years for a flexible sigmoidoscopy or every 10 years for a colonoscopy beginning at age 44 and continuing until age 18.  Hepatitis C blood test.** / For all people born from [birth year] through 1963 and any individual with known risks for hepatitis C.  Abdominal aortic aneurysm (AAA) screening.** / A one-time screening for ages 41 to 22 years who are current or former smokers.  Skin self-exam. / Monthly.  Influenza vaccine. / Every year.  Tetanus, diphtheria, and acellular pertussis (Tdap/Td) vaccine.** / 1 dose of Td every 10 years.  Varicella vaccine.** / Consult your health care provider.  Zoster vaccine.** / 1 dose for adults aged 64 years or older.  Pneumococcal 13-valent conjugate (PCV13) vaccine.** / Consult your health care provider.  Pneumococcal  polysaccharide (PPSV23) vaccine.** / 1 dose for all adults aged 12 years and older.  Meningococcal vaccine.** / Consult your health care provider.  Hepatitis A vaccine.** / Consult your health care provider.  Hepatitis B vaccine.** / Consult your health care provider.  Haemophilus influenzae type b (Hib) vaccine.** / Consult your health care provider. **Family history and personal history of risk and conditions may change your health care provider's recommendations. Document Released: 08/03/2001 Document Revised: 06/12/2013 Document Reviewed: 11/02/2010 [facility] Patient Information 2015 [place], [place]. This information is not intended to replace advice given to you by your health care provider. Make sure you discuss any questions you have with your health care provider.

## 2016-04-30 ENCOUNTER — Other Ambulatory Visit (INDEPENDENT_AMBULATORY_CARE_PROVIDER_SITE_OTHER): Payer: Medicare Other

## 2016-04-30 DIAGNOSIS — D649 Anemia, unspecified: Secondary | ICD-10-CM

## 2016-04-30 DIAGNOSIS — D509 Iron deficiency anemia, unspecified: Secondary | ICD-10-CM

## 2016-04-30 LAB — FERRITIN: FERRITIN: 7.4 ng/mL — AB (ref 10.0–291.0)

## 2016-04-30 LAB — IRON: Iron: 41 ug/dL — ABNORMAL LOW (ref 42–145)

## 2016-04-30 NOTE — Assessment & Plan Note (Signed)
Interstitial lung disease: Remains asx DJD: Having some knee pain, recommend ice and Tylenol. To call if sx increase Osteoporosis: To have [place]  #3 by 05/2016, recheck a bone density test in 6 months. RTC one year

## 2016-05-03 ENCOUNTER — Telehealth: Payer: Self-pay | Admitting: Internal Medicine

## 2016-05-03 NOTE — Telephone Encounter (Signed)
Called, no answer, LMOM

## 2016-05-03 NOTE — Telephone Encounter (Signed)
Pt would like a call back in regards to her lab results. She just has a few questions. ALSO, pt would like to have results mailed to her at her home address.

## 2016-05-03 NOTE — Telephone Encounter (Signed)
FYI

## 2016-05-04 ENCOUNTER — Encounter: Payer: Self-pay | Admitting: Physician Assistant

## 2016-05-04 NOTE — Telephone Encounter (Signed)
Spoke with the patient regards iron deficiency, what that means and what is the standard of care in the context of a stable 82 year old female. Recommend to discuss pros and cons of scopes w/ GI

## 2016-05-10 ENCOUNTER — Ambulatory Visit: Payer: Medicare Other | Admitting: Physician Assistant

## 2016-05-11 ENCOUNTER — Ambulatory Visit (INDEPENDENT_AMBULATORY_CARE_PROVIDER_SITE_OTHER): Payer: Medicare Other | Admitting: Physician Assistant

## 2016-05-11 ENCOUNTER — Encounter: Payer: Self-pay | Admitting: Physician Assistant

## 2016-05-11 VITALS — BP 142/70 | HR 80 | Ht 61.0 in | Wt 156.0 lb

## 2016-05-11 DIAGNOSIS — D509 Iron deficiency anemia, unspecified: Secondary | ICD-10-CM

## 2016-05-11 MED ORDER — NA SULFATE-K SULFATE-MG SULF 17.5-3.13-1.6 GM/177ML PO SOLN: 1.0000 | ORAL | 0 refills | Status: AC

## 2016-05-11 NOTE — Progress Notes (Signed)
Chief Complaint: IDA  HPI:  Kimberly Rodriguez  who was referred to me by Colon Branch, MD for a complaint of an deficiency anemia. Patient was seen in our clinic previously 05/04/2001 for a screening colonoscopy performed by Dr. Olevia Perches. This was normal and the patient was told to return to clinic in 10 year for repeat colonoscopy.    Recent lab work completed 04/29/2016 shows a hemoglobin low at 11.6 down from 13.4 year ago. Iron studies were also completed on 04/30/2016 and shown iron low at 41 and ferritin low at 7.4.   Today, the patient tells me that she is generally healthy and feels well. She does report that she tries to give blood once a year, but this past year gave blood twice, last in July of this year. She was never told she was anemic at those times. She has been feeling generally well. She does have chronic constipation for which she takes one Colace per day which helps her symptoms. She will now have a bowel movement once a day which is typically soft and formed. She denies anything that makes this worse.   Patient denies fever, chills, blood in her stool, melena, weight loss, fatigue, anorexia, change in diet, change in medications, nausea, vomiting, heartburn, reflux, abdominal pain, dizziness, syncope or fatigue.  Past Medical History:  Diagnosis Date  . Cataract   . Eustachian tube dysfunction    Right side, Dr. Laurance Flatten, ENT  . Microhematuria    s/p eval by urology 2008: U/S, CT, cysto w/ neg Bx (dx w/ a renal cyst, see reports)  . Osteoarthritis    h/o back pain, sees ortho s/p shots  . Osteoporosis     Past Surgical History:  Procedure Laterality Date  . ABDOMINAL HYSTERECTOMY     no oophorectomy per pt  . APPENDECTOMY    . DILATION AND CURETTAGE OF UTERUS    . TONSILLECTOMY      Current Outpatient Prescriptions  Medication Sig Dispense Refill  . Calcium Carbonate (CALTRATE 600 PO) Take by mouth.      . denosumab (PROLIA) 60 MG/ML SOLN injection Inject 60 mg into  the skin every 6 (six) months. Administer in upper arm, thigh, or abdomen    . Docusate Calcium (STOOL SOFTENER PO) Take by mouth.    . FIBER PO Take by mouth.      . fluticasone (FLONASE) 50 MCG/ACT nasal spray Place 2 sprays into both nostrils daily as needed.     . Multiple Vitamins-Minerals (CENTRUM SILVER) CHEW Chew by mouth.       No current facility-administered medications for this visit.     Allergies as of 05/11/2016 - Review Complete 05/11/2016  Allergen Reaction Noted  . Astelin [azelastine hcl] Palpitations 09/04/2014    Family History  Problem Relation Age of Onset  . Heart disease Father   . Heart disease Mother   . Colon cancer Neg Hx   . Breast cancer Neg Hx   . Diabetes Neg Hx     Social History   Social History  . Marital status: Widowed    Spouse name: N/A  . Number of children: 0  . Years of education: N/A   Occupational History  . retired    Social History Main Topics  . Smoking status: Former Research scientist (life sciences)  . Smokeless tobacco: Never Used  . Alcohol use Yes     Comment: socially -wine  . Drug use: No  . Sexual activity: Not on file  Other Topics Concern  . Not on file   Social History Narrative   Household -- lives by herself in a townhouse    Emergency contact-- [name] (sister in law) lives in [place] --[phone]   She is her health POA    Review of Systems:     Constitutional: No weight loss, fever or chills HEENT: Eyes: No change in vision               Ears, Nose, Throat:  No change in hearing Skin: No rash Cardiovascular: No chest pain Respiratory: No SOB Gastrointestinal: See HPI and otherwise negative Genitourinary: No dysuria Neurological: No dizziness or syncope Musculoskeletal: No new muscle pain Hematologic: No bleeding or bruising Psychiatric: No history of depression    Physical Exam:  Vital signs: BP (!) 142/70   Pulse 80   Ht 5\' 1"  (1.549 m)   Wt 156 lb (70.8 kg)   BMI 29.48 kg/m   Constitutional:   Pleasant   Elderly Caucasian female appears to be in NAD, Well developed, Well nourished, alert and cooperative Head:  Normocephalic and atraumatic. Eyes:   PEERL, EOMI. No icterus. Conjunctiva pink. Ears:  Normal auditory acuity. Neck:  Supple Throat: Oral cavity and pharynx without inflammation, swelling or lesion.  Respiratory: Respirations even and unlabored. Lungs clear to auscultation bilaterally.   No wheezes, crackles, or rhonchi.  Cardiovascular: Normal S1, S2. No MRG. Irregularly irregular. No peripheral edema, cyanosis or pallor.  Gastrointestinal:  Soft, nondistended, nontender. No rebound or guarding. Normal bowel sounds. No appreciable masses or hepatomegaly. Rectal:  Not performed.  Msk:  Symmetrical without gross deformities. Without edema, no deformity or joint abnormality.  Neurologic:  Alert and  oriented x4;  grossly normal neurologically.  Skin:   Dry and intact without significant lesions or rashes. Psychiatric: Demonstrates good judgement and reason without abnormal affect or behaviors.  Most recent labs:  CBC    Component Value Date/Time   WBC 5.7 04/29/2016 1032   RBC 4.25 04/29/2016 1032   HGB 11.6 (L) 04/29/2016 1032   HCT 35.9 (L) 04/29/2016 1032   PLT 275.0 04/29/2016 1032   MCV 84.5 04/29/2016 1032   MCH 31.5 09/02/2014 2045   MCHC 32.2 04/29/2016 1032   RDW 17.5 (H) 04/29/2016 1032   LYMPHSABS 1.2 04/29/2016 1032   MONOABS 0.7 04/29/2016 1032   EOSABS 0.2 04/29/2016 1032   BASOSABS 0.1 04/29/2016 1032    CMP     Component Value Date/Time   NA 140 04/29/2016 1032   K 4.2 04/29/2016 1032   CL 104 04/29/2016 1032   CO2 31 04/29/2016 1032   GLUCOSE 93 04/29/2016 1032   BUN 19 04/29/2016 1032   CREATININE 0.75 04/29/2016 1032   CALCIUM 9.6 04/29/2016 1032   PROT 6.9 09/02/2014 2045   ALBUMIN 3.3 (L) 09/02/2014 2045   AST 24 09/02/2014 2045   ALT 30 09/02/2014 2045   ALKPHOS 82 09/02/2014 2045   BILITOT 0.2 (L) 09/02/2014 2045   GFRNONAA 78 (L)  09/02/2014 2045   GFRAA 90 (L) 09/02/2014 2045   Results for SHARVON, RADICH (MRN [id number]) as of 05/11/2016 10:53  Ref. Range 04/30/2016 16:14  Iron Latest Ref Range: 42 - 145 ug/dL 41 (L)  Ferritin Latest Ref Range: 10.0 - 291.0 ng/mL 7.4 (L)   Assessment: 1. IDA: Patient with newly diagnosed iron deficiency anemia, currently not on iron supplementation, no GI symptoms, healthy otherwise; consider upper versus lower GI source of blood loss or malabsorption  Plan: 1. Schedule patient for EGD and colonoscopy for further evaluation of newly diagnosed IDA. This was scheduled with Dr. Silverio Decamp as she is a supervising physician this morning. This will take place in the [facility]. Patient and I discussed risks, benefits, limitations and alternatives to this procedure and she agrees to proceed. 2. Recommend the patient start an over-the-counter daily iron supplement, encouraged her to take this with a small amount of orange juice or vitamin C to help absorption 3. Discussed that she may become constipated after she starts iron supplement, would recommend that she had MiraLAX titrated to one soft regular bowel movement 4. Patient to follow in clinic per Dr. Woodward Ku recommendations after time of procedures.  Ellouise Newer, PA-C [facility] 05/11/2016, 10:53 AM  Cc: Colon Branch, MD

## 2016-05-11 NOTE — Patient Instructions (Signed)
You have been scheduled for an endoscopy and colonoscopy. Please follow the written instructions given to you at your visit today. Please pick up your prep supplies at the pharmacy within the next 1-3 days. If you use inhalers (even only as needed), please bring them with you on the day of your procedure.   Start iron supplement once a daily.  May need to add Miralax to help with constipation.

## 2016-05-12 ENCOUNTER — Telehealth: Payer: Self-pay | Admitting: Internal Medicine

## 2016-05-12 DIAGNOSIS — H2512 Age-related nuclear cataract, left eye: Secondary | ICD-10-CM | POA: Diagnosis not present

## 2016-05-12 DIAGNOSIS — Z83511 Family history of glaucoma: Secondary | ICD-10-CM | POA: Diagnosis not present

## 2016-05-12 DIAGNOSIS — H43813 Vitreous degeneration, bilateral: Secondary | ICD-10-CM | POA: Diagnosis not present

## 2016-05-12 DIAGNOSIS — H25012 Cortical age-related cataract, left eye: Secondary | ICD-10-CM | POA: Diagnosis not present

## 2016-05-12 DIAGNOSIS — H04123 Dry eye syndrome of bilateral lacrimal glands: Secondary | ICD-10-CM | POA: Diagnosis not present

## 2016-05-12 NOTE — Telephone Encounter (Signed)
Pt due for Prolia on or after 06/03/2016. Letter printed and mailed to Pt informing Pt to call and schedule nurse visit.

## 2016-05-12 NOTE — Progress Notes (Signed)
Reviewed and agree with documentation and assessment and plan. K. Veena Cleatis Fandrich , MD   

## 2016-05-12 NOTE — Telephone Encounter (Signed)
Prolia Benefits verified No PA required 20% Prolia $50 office or admin fee Patient will owe approximately $250 out of pocket  Message sent to Gilmore Laroche to order Arrie Eastern will you contact patient to schedule

## 2016-06-03 ENCOUNTER — Ambulatory Visit (INDEPENDENT_AMBULATORY_CARE_PROVIDER_SITE_OTHER): Payer: Medicare Other | Admitting: Behavioral Health

## 2016-06-03 DIAGNOSIS — M810 Age-related osteoporosis without current pathological fracture: Secondary | ICD-10-CM

## 2016-06-03 MED ORDER — DENOSUMAB 60 MG/ML ~~LOC~~ SOLN
60.0000 mg | Freq: Once | SUBCUTANEOUS | Status: AC
  Administered 2016-06-03: 60 mg via SUBCUTANEOUS

## 2016-06-03 NOTE — Progress Notes (Addendum)
Pre visit review using our clinic review tool, if applicable. No additional management support is needed unless otherwise documented below in the visit note.  Patient in clinic today for Prolia injection. SQ given in Left Arm. Patient tolerated injection well. No signs or symptoms of a reaction before leaving the nurse visit  .Kathlene November, MD

## 2016-06-08 DIAGNOSIS — M79671 Pain in right foot: Secondary | ICD-10-CM | POA: Diagnosis not present

## 2016-06-08 DIAGNOSIS — M79672 Pain in left foot: Secondary | ICD-10-CM | POA: Diagnosis not present

## 2016-06-08 DIAGNOSIS — B351 Tinea unguium: Secondary | ICD-10-CM | POA: Diagnosis not present

## 2016-06-08 DIAGNOSIS — L84 Corns and callosities: Secondary | ICD-10-CM | POA: Diagnosis not present

## 2016-06-22 ENCOUNTER — Encounter: Payer: Self-pay | Admitting: Gastroenterology

## 2016-07-02 ENCOUNTER — Ambulatory Visit (AMBULATORY_SURGERY_CENTER): Payer: Medicare Other | Admitting: Gastroenterology

## 2016-07-02 ENCOUNTER — Encounter: Payer: Self-pay | Admitting: Gastroenterology

## 2016-07-02 VITALS — BP 115/46 | HR 63 | Temp 97.5°F | Resp 15 | Ht 60.0 in | Wt 156.0 lb

## 2016-07-02 DIAGNOSIS — D120 Benign neoplasm of cecum: Secondary | ICD-10-CM

## 2016-07-02 DIAGNOSIS — D121 Benign neoplasm of appendix: Secondary | ICD-10-CM | POA: Diagnosis not present

## 2016-07-02 DIAGNOSIS — K2211 Ulcer of esophagus with bleeding: Secondary | ICD-10-CM | POA: Diagnosis not present

## 2016-07-02 DIAGNOSIS — D122 Benign neoplasm of ascending colon: Secondary | ICD-10-CM | POA: Diagnosis not present

## 2016-07-02 DIAGNOSIS — D509 Iron deficiency anemia, unspecified: Secondary | ICD-10-CM | POA: Diagnosis not present

## 2016-07-02 DIAGNOSIS — D508 Other iron deficiency anemias: Secondary | ICD-10-CM

## 2016-07-02 DIAGNOSIS — K2970 Gastritis, unspecified, without bleeding: Secondary | ICD-10-CM

## 2016-07-02 MED ORDER — PANTOPRAZOLE SODIUM 20 MG PO TBEC: 20.0000 mg | DELAYED_RELEASE_TABLET | Freq: Every day | ORAL | 11 refills | Status: DC

## 2016-07-02 MED ORDER — SODIUM CHLORIDE 0.9 % IV SOLN: 500.0000 mL | INTRAVENOUS | Status: DC

## 2016-07-02 NOTE — Progress Notes (Signed)
Called to room to assist during endoscopic procedure.  Patient ID and intended procedure confirmed with present staff. Received instructions for my participation in the procedure from the performing physician.  

## 2016-07-02 NOTE — Patient Instructions (Addendum)
YOU HAD AN ENDOSCOPIC PROCEDURE TODAY AT [facility]:   Refer to the procedure report that was given to you for any specific questions about what was found during the examination.  If the procedure report does not answer your questions, please call your gastroenterologist to clarify.  If you requested that your care partner not be given the details of your procedure findings, then the procedure report has been included in a sealed envelope for you to review at your convenience later.  YOU SHOULD EXPECT: Some feelings of bloating in the abdomen. Passage of more gas than usual.  Walking can help get rid of the air that was put into your GI tract during the procedure and reduce the bloating. If you had a lower endoscopy (such as a colonoscopy or flexible sigmoidoscopy) you may notice spotting of blood in your stool or on the toilet paper. If you underwent a bowel prep for your procedure, you may not have a normal bowel movement for a few days.  Please Note:  You might notice some irritation and congestion in your nose or some drainage.  This is from the oxygen used during your procedure.  There is no need for concern and it should clear up in a day or so.  SYMPTOMS TO REPORT IMMEDIATELY:   Following lower endoscopy (colonoscopy or flexible sigmoidoscopy):  Excessive amounts of blood in the stool  Significant tenderness or worsening of abdominal pains  Swelling of the abdomen that is new, acute  Fever of 100F or higher   Following upper endoscopy (EGD)  Vomiting of blood or coffee ground material  New chest pain or pain under the shoulder blades  Painful or persistently difficult swallowing  New shortness of breath  Fever of 100F or higher  Black, tarry-looking stools  For urgent or emergent issues, a gastroenterologist can be reached at any hour by calling [phone].   DIET:  We do recommend a small meal at first, but then you may proceed to your regular diet.  Drink  plenty of fluids but you should avoid alcoholic beverages for 24 hours.  ACTIVITY:  You should plan to take it easy for the rest of today and you should NOT DRIVE or use heavy machinery until tomorrow (because of the sedation medicines used during the test).    FOLLOW UP: Our staff will call the number listed on your records the next business day following your procedure to check on you and address any questions or concerns that you may have regarding the information given to you following your procedure. If we do not reach you, we will leave a message.  However, if you are feeling well and you are not experiencing any problems, there is no need to return our call.  We will assume that you have returned to your regular daily activities without incident.  If any biopsies were taken you will be contacted by phone or by letter within the next 1-3 weeks.  Please call us at [phone] if you have not heard about the biopsies in 3 weeks.    SIGNATURES/CONFIDENTIALITY: You and/or your care partner have signed paperwork which will be entered into your electronic medical record.  These signatures attest to the fact that that the information above on your After Visit Summary has been reviewed and is understood.  Full responsibility of the confidentiality of this discharge information lies with you and/or your care-partner.   INFORMATION ON POLYPS AND DIVERTICULOSIS GIVEN TO YOU TODAY  AWAIT PATHOLOGY REPORT ON POLYPS REMOVED  NO ASPIRIN,IBUPROFEN ,NAPROXEN,OR OTHER NON STEROIDAL ANTI-INFLAMMATORY MEDICATIONS   FOLLOW ANTI REFLUX REGIMEN   PROTONIX 20 MG DAILY-PICK UP AT [facility]

## 2016-07-02 NOTE — Progress Notes (Signed)
Teeth unchanged after procedure.A and O x3. Report to RN. Tolerated MAC anesthesia well. 

## 2016-07-02 NOTE — Op Note (Signed)
[facility] Patient Name: Kimberly Rodriguez Procedure Date: 07/02/2016 1:58 PM MRN: [id number] Endoscopist: Mauri Pole , MD Age: 83 Referring MD:  Date of Birth: [birth year] Gender: Female Account #: [id number] Procedure:                Colonoscopy Indications:              Unexplained iron deficiency anemia Medicines:                Monitored Anesthesia Care Procedure:                Pre-Anesthesia Assessment:                           - Prior to the procedure, a History and Physical                            was performed, and patient medications and                            allergies were reviewed. The patient's tolerance of                            previous anesthesia was also reviewed. The risks                            and benefits of the procedure and the sedation                            options and risks were discussed with the patient.                            All questions were answered, and informed consent                            was obtained. Prior Anticoagulants: The patient has                            taken no previous anticoagulant or antiplatelet                            agents. ASA Grade Assessment: II - A patient with                            mild systemic disease. After reviewing the risks                            and benefits, the patient was deemed in                            satisfactory condition to undergo the procedure.                           After obtaining informed consent, the colonoscope  was passed under direct vision. Throughout the                            procedure, the patient's blood pressure, pulse, and                            oxygen saturations were monitored continuously. The                            Colonoscope was introduced through the anus and                            advanced to the the cecum, identified by                            appendiceal orifice and  ileocecal valve. The                            colonoscopy was performed without difficulty. The                            patient tolerated the procedure well. The quality                            of the bowel preparation was good. The ileocecal                            valve, appendiceal orifice, and rectum were                            photographed. Scope In: 2:19:59 PM Scope Out: 2:48:06 PM Scope Withdrawal Time: 0 hours 16 minutes 26 seconds  Total Procedure Duration: 0 hours 28 minutes 7 seconds  Findings:                 The perianal and digital rectal examinations were                            normal.                           A 2 mm polyp was found in the appendiceal orifice.                            The polyp was sessile. The polyp was removed with a                            cold biopsy forceps. Resection and retrieval were                            complete.                           Three sessile polyps were found in the ascending  colon and cecum. The polyps were 3 to 6 mm in size.                            These polyps were removed with a cold snare.                            Resection and retrieval were complete.                           Multiple small and large-mouthed diverticula were                            found in the sigmoid colon.                           Non-bleeding internal hemorrhoids were found during                            retroflexion. The hemorrhoids were small.                           The exam was otherwise without abnormality. Complications:            No immediate complications. Estimated Blood Loss:     Estimated blood loss was minimal. Impression:               - One 2 mm polyp at the appendiceal orifice,                            removed with a cold biopsy forceps. Resected and                            retrieved.                           - Three 3 to 6 mm polyps in the ascending colon and                             in the cecum, removed with a cold snare. Resected                            and retrieved.                           - Diverticulosis in the sigmoid colon.                           - Non-bleeding internal hemorrhoids.                           - The examination was otherwise normal. Recommendation:           - Patient has a contact number available for                            emergencies. The signs  and symptoms of potential                            delayed complications were discussed with the                            patient. Return to normal activities tomorrow.                            Written discharge instructions were provided to the                            patient.                           - Resume previous diet.                           - Continue present medications.                           - Await pathology results.                           - No recommendation at this time regarding repeat                            colonoscopy due to age.                           - Return to GI clinic PRN. Mauri Pole, MD 07/02/2016 3:00:05 PM This report has been signed electronically.

## 2016-07-02 NOTE — Op Note (Signed)
[facility] Patient Name: Kimberly Rodriguez Procedure Date: 07/02/2016 1:59 PM MRN: [id number] Endoscopist: Mauri Pole , MD Age: 83 Referring MD:  Date of Birth: [birth year] Gender: Female Account #: [id number] Procedure:                Upper GI endoscopy Indications:              Suspected upper gastrointestinal bleeding in                            patient with unexplained iron deficiency anemia Medicines:                Monitored Anesthesia Care Procedure:                Pre-Anesthesia Assessment:                           - Prior to the procedure, a History and Physical                            was performed, and patient medications and                            allergies were reviewed. The patient's tolerance of                            previous anesthesia was also reviewed. The risks                            and benefits of the procedure and the sedation                            options and risks were discussed with the patient.                            All questions were answered, and informed consent                            was obtained. Prior Anticoagulants: The patient has                            taken no previous anticoagulant or antiplatelet                            agents. ASA Grade Assessment: II - A patient with                            mild systemic disease. After reviewing the risks                            and benefits, the patient was deemed in                            satisfactory condition to undergo the procedure.  After obtaining informed consent, the endoscope was                            passed under direct vision. Throughout the                            procedure, the patient's blood pressure, pulse, and                            oxygen saturations were monitored continuously. The                            Model GIF-HQ190 [id number]) scope was introduced                            through  the mouth, and advanced to the second part                            of duodenum. The upper GI endoscopy was                            accomplished without difficulty. The patient                            tolerated the procedure well. Scope In: Scope Out: Findings:                 The lower third of the esophagus was mildly                            tortuous.                           A medium-sized hiatal hernia ~7-8cm with a few                            Cameron ulcers with stigmata of recent bleeding was                            found. Mild gastritis with patchy erythema in the                            antrum                           The first portion of the duodenum and second                            portion of the duodenum were normal. Complications:            No immediate complications. Estimated Blood Loss:     Estimated blood loss: none. Impression:               - Tortuous esophagus.                           -  Medium-sized hiatal hernia with a few Cameron                            ulcers.                           - Normal first portion of the duodenum and second                            portion of the duodenum.                           - No specimens collected. Recommendation:           - Patient has a contact number available for                            emergencies. The signs and symptoms of potential                            delayed complications were discussed with the                            patient. Return to normal activities tomorrow.                            Written discharge instructions were provided to the                            patient.                           - Resume previous diet.                           - Continue present medications.                           - No aspirin, ibuprofen, naproxen, or other                            non-steroidal anti-inflammatory drugs.                           - Follow an antireflux  regimen.                           - Use Protonix (pantoprazole) 20 mg PO daily. Mauri Pole, MD 07/02/2016 2:56:25 PM This report has been signed electronically.

## 2016-07-05 ENCOUNTER — Telehealth: Payer: Self-pay

## 2016-07-05 NOTE — Telephone Encounter (Signed)
  Follow up Call-  Call back number 07/02/2016  Post procedure Call Back phone  # [phone]  Permission to leave phone message Yes  Some recent data might be hidden     Patient questions:  Do you have a fever, pain , or abdominal swelling? No. Pain Score  0 *  Have you tolerated food without any problems? Yes.    Have you been able to return to your normal activities? Yes.    Do you have any questions about your discharge instructions: Diet   No. Medications  No. Follow up visit  No.  Do you have questions or concerns about your Care? No.  Actions: * If pain score is 4 or above: No action needed, pain <4.

## 2016-07-12 ENCOUNTER — Telehealth: Payer: Self-pay | Admitting: Internal Medicine

## 2016-07-12 ENCOUNTER — Encounter: Payer: Self-pay | Admitting: Gastroenterology

## 2016-07-12 NOTE — Telephone Encounter (Signed)
°  Relation to [id number] Call back number: [phone] or [phone] Pharmacy:  Reason for call: pt states she recently had a colonoscopy and endoscopic done, pt would like Dr. Larose Kells opinion on her taking protonix states they recommended she take it, however she did some research and has questions in regards to her   Osteoporosis, states she is on a medication for that and she feels the two meds would interact with each other and would like Dr. Larose Kells opinion.

## 2016-07-12 NOTE — Telephone Encounter (Signed)
There is no direct interaction between the Protonix. The concern would be if Protonix, and acid reducer,  induces osteoporosis but studies are controversial. She was recommended a low dose (20 mg daily) which is very good.. I think is okay to take both.

## 2016-07-12 NOTE — Telephone Encounter (Signed)
Please advise 

## 2016-07-12 NOTE — Telephone Encounter (Signed)
[facility] w/ recommendations by PCP. Instructed to call if questions/concerns.

## 2016-08-31 ENCOUNTER — Telehealth: Payer: Self-pay | Admitting: Internal Medicine

## 2016-08-31 NOTE — Telephone Encounter (Signed)
Yes, that is ok, needs 2 doses

## 2016-08-31 NOTE — Telephone Encounter (Signed)
Informed Pt that Zostavax was received in 2010, she can received Shingrix if she likes, informed that she can receive at pharmacy (must have 2 doses-2 months apart), also requested she have pharmacy send notification of immunization if she decides. Pt verbalized understanding.

## 2016-08-31 NOTE — Telephone Encounter (Signed)
Can Pt's who previously had Zostavax receive Shingrix?

## 2016-08-31 NOTE — Telephone Encounter (Signed)
Caller name: Relationship to patient: Self Can be reached: [phone]  Pharmacy:  Reason for call: Patient needs to know when she had her last Shingles Shot because she wants to get the new one. Plse adv

## 2016-10-18 ENCOUNTER — Other Ambulatory Visit: Payer: Self-pay | Admitting: Internal Medicine

## 2016-10-18 DIAGNOSIS — Z1231 Encounter for screening mammogram for malignant neoplasm of breast: Secondary | ICD-10-CM

## 2016-11-18 ENCOUNTER — Ambulatory Visit (HOSPITAL_BASED_OUTPATIENT_CLINIC_OR_DEPARTMENT_OTHER)
Admission: RE | Admit: 2016-11-18 | Discharge: 2016-11-18 | Disposition: A | Discharge status: Home / Self Care | Payer: Medicare Other | Source: Ambulatory Visit | Attending: Internal Medicine | Admitting: Internal Medicine

## 2016-11-18 DIAGNOSIS — Z1231 Encounter for screening mammogram for malignant neoplasm of breast: Secondary | ICD-10-CM

## 2016-11-19 DIAGNOSIS — D2239 Melanocytic nevi of other parts of face: Secondary | ICD-10-CM | POA: Diagnosis not present

## 2016-11-19 DIAGNOSIS — T07XXXA Unspecified multiple injuries, initial encounter: Secondary | ICD-10-CM | POA: Diagnosis not present

## 2016-11-19 DIAGNOSIS — D1801 Hemangioma of skin and subcutaneous tissue: Secondary | ICD-10-CM | POA: Diagnosis not present

## 2016-11-19 DIAGNOSIS — L821 Other seborrheic keratosis: Secondary | ICD-10-CM | POA: Diagnosis not present

## 2016-11-30 NOTE — Progress Notes (Addendum)
Subjective:   Kimberly Rodriguez is a 83 y.o. female who presents for Medicare Annual (Subsequent) preventive examination.  Review of Systems:  No ROS.  Medicare Wellness Visit. Additional risk factors are reflected in the social history.  Cardiac Risk Factors include: advanced age (>58men, >57 women) Sleep patterns:  Sleeps 7-8 hrs per night. Wakes once to urinate.  Home Safety/Smoke Alarms: Feels safe in home. Smoke alarms in place.  Living environment; residence and Firearm Safety: Lives alone. Single level. No guns. Seat Belt Safety/Bike Helmet: Wears seat belt.   Counseling:   Eye Exam- Reading and driving glasses. Eye doctor annually.  Dental- Dr. Marcello Moores every 3 months.  Female:   Pap-  Hysterectomy     Mammo- last 11/18/16: BI-RADS CATEGORY  1: Negative.       Dexa scan-  Last 05/21/15: osteoporotic CCS- last 07/02/16: diverticulosis. Precancerous polyps removed. No further routine screening recommended based on age, per report letter.    Objective:     Vitals: BP 130/68 (BP Location: Right Arm, Patient Position: Sitting, Cuff Size: Normal)   Pulse 74   Ht 5' (1.524 m)   Wt 158 lb 6.4 oz (71.8 kg)   SpO2 98%   BMI 30.94 kg/m   Body mass index is 30.94 kg/m.   Tobacco History  Smoking Status  . Former Smoker  Smokeless Tobacco  . Never Used     Counseling given: No   Past Medical History:  Diagnosis Date  . Anemia   . Blood transfusion without reported diagnosis   . Cataract   . Eustachian tube dysfunction    Right side, Dr. Laurance Flatten, ENT  . Microhematuria    s/p eval by urology 2008: U/S, CT, cysto w/ neg Bx (dx w/ a renal cyst, see reports)  . Osteoarthritis    h/o back pain, sees ortho s/p shots  . Osteoporosis    Past Surgical History:  Procedure Laterality Date  . ABDOMINAL HYSTERECTOMY     no oophorectomy per pt  . APPENDECTOMY    . COLONOSCOPY    . DILATION AND CURETTAGE OF UTERUS    . TONSILLECTOMY     Family History  Problem Relation  Age of Onset  . Heart disease Father   . Prostate cancer Father   . Heart disease Mother   . Colon cancer Neg Hx   . Breast cancer Neg Hx   . Diabetes Neg Hx    History  Sexual Activity  . Sexual activity: Not on file    Outpatient Encounter Prescriptions as of 12/01/2016  Medication Sig  . Calcium Carbonate (CALTRATE 600 PO) Take by mouth.    . denosumab (PROLIA) 60 MG/ML SOLN injection Inject 60 mg into the skin every 6 (six) months. Administer in upper arm, thigh, or abdomen  . Docusate Calcium (STOOL SOFTENER PO) Take by mouth.  . FIBER PO Take by mouth.    . fluticasone (FLONASE) 50 MCG/ACT nasal spray Place 2 sprays into both nostrils daily as needed.   . Multiple Vitamins-Minerals (CENTRUM SILVER) CHEW Chew by mouth.    . pantoprazole (PROTONIX) 20 MG tablet Take 1 tablet (20 mg total) by mouth daily.   Facility-Administered Encounter Medications as of 12/01/2016  Medication  . 0.9 %  sodium chloride infusion    Activities of Daily Living In your present state of health, do you have any difficulty performing the following activities: 12/01/2016 04/29/2016  Hearing? N N  Vision? N N  Difficulty concentrating or making  decisions? N N  Walking or climbing stairs? N N  Dressing or bathing? N N  Doing errands, shopping? N N  Preparing Food and eating ? N -  Using the Toilet? N -  In the past six months, have you accidently leaked urine? N -  Do you have problems with loss of bowel control? N -  Managing your Medications? N -  Managing your Finances? N -  Housekeeping or managing your Housekeeping? N -  Some recent data might be hidden    Patient Care Team: Colon Branch, MD as PCP - General Roel Cluck, MD as Referring Physician (Ophthalmology) Maggie Schwalbe., MD (Family Medicine)    Assessment:    Physical assessment deferred to PCP.  Exercise Activities and Dietary recommendations Current Exercise Habits: Home exercise routine, Type of exercise:  walking, Time (Minutes): 20, Frequency (Times/Week): 5, Weekly Exercise (Minutes/Week): 100   Diet (meal preparation, eat out, water intake, caffeinated beverages, dairy products, fruits and vegetables): in general, a "healthy" diet  , well balanced     Goals      Patient Stated   . Maintain current healthy lifestyle. (pt-stated)      Fall Risk Fall Risk  04/29/2016 12/02/2015 08/04/2015 04/01/2015 03/27/2014  Falls in the past year? No No Yes No No  Number falls in past yr: - - 1 - -  Injury with Fall? - - No - -  Follow up - - Falls evaluation completed;Education provided - -   Depression Screen PHQ 2/9 Scores 04/29/2016 12/02/2015 08/04/2015 04/01/2015  PHQ - 2 Score 0 0 0 0     Cognitive Function MMSE - Mini Mental State Exam 12/01/2016  Orientation to time 5  Orientation to Place 5  Registration 3  Attention/ Calculation 5  Recall 2  Language- name 2 objects 2  Language- repeat 1  Language- follow 3 step command 3  Language- read & follow direction 1  Write a sentence 1  Copy design 1  Total score 29        Immunization History  Administered Date(s) Administered  . Influenza Split 02/29/2012  . Influenza Whole 04/17/2008  . Influenza, High Dose Seasonal PF 04/01/2015, 04/29/2016  . Influenza,inj,Quad PF,36+ Mos 03/27/2014  . Pneumococcal Conjugate-13 03/27/2014  . Pneumococcal Polysaccharide-23 09/30/2004  . Td 03/03/2001  . Tdap 12/11/2010  . Zoster 11/29/2008   Screening Tests Health Maintenance  Topic Date Due  . INFLUENZA VACCINE  01/19/2017  . TETANUS/TDAP  12/10/2020  . DEXA SCAN  Completed  . PNA vac Low Risk Adult  Completed      Plan:     Follow up with Dr.Paz as scheduled.  Continue to eat heart healthy diet (full of fruits, vegetables, whole grains, lean protein, water--limit salt, fat, and sugar intake) and increase physical activity as tolerated.  Continue doing brain stimulating activities (puzzles, reading, adult coloring books, staying  active) to keep memory sharp.    I have personally reviewed and noted the following in the patient's chart:   . Medical and social history . Use of alcohol, tobacco or illicit drugs  . Current medications and supplements . Functional ability and status . Nutritional status . Physical activity . Advanced directives . List of other physicians . Hospitalizations, surgeries, and ER visits in previous 12 months . Vitals . Screenings to include cognitive, depression, and falls . Referrals and appointments  In addition, I have reviewed and discussed with patient certain preventive protocols, quality metrics, and  best practice recommendations. A written personalized care plan for preventive services as well as general preventive health recommendations were provided to patient.     [name] [place], [place]  12/01/2016   Kathlene November, MD

## 2016-12-01 ENCOUNTER — Encounter: Payer: Self-pay | Admitting: *Deleted

## 2016-12-01 ENCOUNTER — Ambulatory Visit (INDEPENDENT_AMBULATORY_CARE_PROVIDER_SITE_OTHER): Payer: Medicare Other | Admitting: *Deleted

## 2016-12-01 VITALS — BP 130/68 | HR 74 | Ht 60.0 in | Wt 158.4 lb

## 2016-12-01 DIAGNOSIS — Z0000 Encounter for general adult medical examination without abnormal findings: Secondary | ICD-10-CM | POA: Diagnosis not present

## 2016-12-01 NOTE — Patient Instructions (Signed)
Kimberly Rodriguez , Thank you for taking time to come for your Medicare Wellness Visit. I appreciate your ongoing commitment to your health goals. Please review the following plan we discussed and let me know if I can assist you in the future.   These are the goals we discussed: Goals      Patient Stated   . Maintain current healthy lifestyle. (pt-stated)       This is a list of the screening recommended for you and due dates:  Health Maintenance  Topic Date Due  . Flu Shot  01/19/2017  . Tetanus Vaccine  12/10/2020  . DEXA scan (bone density measurement)  Completed  . Pneumonia vaccines  Completed   Follow up with Dr.Paz as scheduled.  Continue to eat heart healthy diet (full of fruits, vegetables, whole grains, lean protein, water--limit salt, fat, and sugar intake) and increase physical activity as tolerated.  Continue doing brain stimulating activities (puzzles, reading, adult coloring books, staying active) to keep memory sharp.    Health Maintenance for Postmenopausal Women Menopause is a normal process in which your reproductive ability comes to an end. This process happens gradually over a span of months to years, usually between the ages of 49 and 48. Menopause is complete when you have missed 12 consecutive menstrual periods. It is important to talk with your health care provider about some of the most common conditions that affect postmenopausal women, such as heart disease, cancer, and bone loss (osteoporosis). Adopting a healthy lifestyle and getting preventive care can help to promote your health and wellness. Those actions can also lower your chances of developing some of these common conditions. What should I know about menopause? During menopause, you may experience a number of symptoms, such as:  Moderate-to-severe hot flashes.  Night sweats.  Decrease in sex drive.  Mood swings.  Headaches.  Tiredness.  Irritability.  Memory  problems.  Insomnia.  Choosing to treat or not to treat menopausal changes is an individual decision that you make with your health care provider. What should I know about hormone replacement therapy and supplements? Hormone therapy products are effective for treating symptoms that are associated with menopause, such as hot flashes and night sweats. Hormone replacement carries certain risks, especially as you become older. If you are thinking about using estrogen or estrogen with progestin treatments, discuss the benefits and risks with your health care provider. What should I know about heart disease and stroke? Heart disease, heart attack, and stroke become more likely as you age. This may be due, in part, to the hormonal changes that your body experiences during menopause. These can affect how your body processes dietary fats, triglycerides, and cholesterol. Heart attack and stroke are both medical emergencies. There are many things that you can do to help prevent heart disease and stroke:  Have your blood pressure checked at least every 1-2 years. High blood pressure causes heart disease and increases the risk of stroke.  If you are 73-42 years old, ask your health care provider if you should take aspirin to prevent a heart attack or a stroke.  Do not use any tobacco products, including cigarettes, chewing tobacco, or electronic cigarettes. If you need help quitting, ask your health care provider.  It is important to eat a healthy diet and maintain a healthy weight. ? Be sure to include plenty of vegetables, fruits, low-fat dairy products, and lean protein. ? Avoid eating foods that are high in solid fats, added sugars, or salt (sodium).  Get regular exercise. This is one of the most important things that you can do for your health. ? Try to exercise for at least 150 minutes each week. The type of exercise that you do should increase your heart rate and make you sweat. This is known as  moderate-intensity exercise. ? Try to do strengthening exercises at least twice each week. Do these in addition to the moderate-intensity exercise.  Know your numbers.Ask your health care provider to check your cholesterol and your blood glucose. Continue to have your blood tested as directed by your health care provider.  What should I know about cancer screening? There are several types of cancer. Take the following steps to reduce your risk and to catch any cancer development as early as possible. Breast Cancer  Practice breast self-awareness. ? This means understanding how your breasts normally appear and feel. ? It also means doing regular breast self-exams. Let your health care provider know about any changes, no matter how small.  If you are 70 or older, have a clinician do a breast exam (clinical breast exam or CBE) every year. Depending on your age, family history, and medical history, it may be recommended that you also have a yearly breast X-ray (mammogram).  If you have a family history of breast cancer, talk with your health care provider about genetic screening.  If you are at high risk for breast cancer, talk with your health care provider about having an MRI and a mammogram every year.  Breast cancer (BRCA) gene test is recommended for women who have family members with BRCA-related cancers. Results of the assessment will determine the need for genetic counseling and BRCA1 and for BRCA2 testing. BRCA-related cancers include these types: ? Breast. This occurs in males or females. ? Ovarian. ? Tubal. This may also be called fallopian tube cancer. ? Cancer of the abdominal or pelvic lining (peritoneal cancer). ? Prostate. ? Pancreatic.  Cervical, Uterine, and Ovarian Cancer Your health care provider may recommend that you be screened regularly for cancer of the pelvic organs. These include your ovaries, uterus, and vagina. This screening involves a pelvic exam, which  includes checking for microscopic changes to the surface of your cervix (Pap test).  For women ages 21-65, health care providers may recommend a pelvic exam and a Pap test every three years. For women ages 40-65, they may recommend the Pap test and pelvic exam, combined with testing for human papilloma virus (HPV), every five years. Some types of HPV increase your risk of cervical cancer. Testing for HPV may also be done on women of any age who have unclear Pap test results.  Other health care providers may not recommend any screening for nonpregnant women who are considered low risk for pelvic cancer and have no symptoms. Ask your health care provider if a screening pelvic exam is right for you.  If you have had past treatment for cervical cancer or a condition that could lead to cancer, you need Pap tests and screening for cancer for at least 20 years after your treatment. If Pap tests have been discontinued for you, your risk factors (such as having a new sexual partner) need to be reassessed to determine if you should start having screenings again. Some women have medical problems that increase the chance of getting cervical cancer. In these cases, your health care provider may recommend that you have screening and Pap tests more often.  If you have a family history of uterine cancer or ovarian  cancer, talk with your health care provider about genetic screening.  If you have vaginal bleeding after reaching menopause, tell your health care provider.  There are currently no reliable tests available to screen for ovarian cancer.  Lung Cancer Lung cancer screening is recommended for adults 23-44 years old who are at high risk for lung cancer because of a history of smoking. A yearly low-dose CT scan of the lungs is recommended if you:  Currently smoke.  Have a history of at least 30 pack-years of smoking and you currently smoke or have quit within the past 15 years. A pack-year is smoking an  average of one pack of cigarettes per day for one year.  Yearly screening should:  Continue until it has been 15 years since you quit.  Stop if you develop a health problem that would prevent you from having lung cancer treatment.  Colorectal Cancer  This type of cancer can be detected and can often be prevented.  Routine colorectal cancer screening usually begins at age 66 and continues through age 50.  If you have risk factors for colon cancer, your health care provider may recommend that you be screened at an earlier age.  If you have a family history of colorectal cancer, talk with your health care provider about genetic screening.  Your health care provider may also recommend using home test kits to check for hidden blood in your stool.  A small camera at the end of a tube can be used to examine your colon directly (sigmoidoscopy or colonoscopy). This is done to check for the earliest forms of colorectal cancer.  Direct examination of the colon should be repeated every 5-10 years until age 56. However, if early forms of precancerous polyps or small growths are found or if you have a family history or genetic risk for colorectal cancer, you may need to be screened more often.  Skin Cancer  Check your skin from head to toe regularly.  Monitor any moles. Be sure to tell your health care provider: ? About any new moles or changes in moles, especially if there is a change in a mole's shape or color. ? If you have a mole that is larger than the size of a pencil eraser.  If any of your family members has a history of skin cancer, especially at a young age, talk with your health care provider about genetic screening.  Always use sunscreen. Apply sunscreen liberally and repeatedly throughout the day.  Whenever you are outside, protect yourself by wearing long sleeves, pants, a wide-brimmed hat, and sunglasses.  What should I know about osteoporosis? Osteoporosis is a condition in  which bone destruction happens more quickly than new bone creation. After menopause, you may be at an increased risk for osteoporosis. To help prevent osteoporosis or the bone fractures that can happen because of osteoporosis, the following is recommended:  If you are 56-3 years old, get at least 1,000 mg of calcium and at least 600 mg of vitamin D per day.  If you are older than age 55 but younger than age 41, get at least 1,200 mg of calcium and at least 600 mg of vitamin D per day.  If you are older than age 43, get at least 1,200 mg of calcium and at least 800 mg of vitamin D per day.  Smoking and excessive alcohol intake increase the risk of osteoporosis. Eat foods that are rich in calcium and vitamin D, and do weight-bearing exercises several times  each week as directed by your health care provider. What should I know about how menopause affects my mental health? Depression may occur at any age, but it is more common as you become older. Common symptoms of depression include:  Low or sad mood.  Changes in sleep patterns.  Changes in appetite or eating patterns.  Feeling an overall lack of motivation or enjoyment of activities that you previously enjoyed.  Frequent crying spells.  Talk with your health care provider if you think that you are experiencing depression. What should I know about immunizations? It is important that you get and maintain your immunizations. These include:  Tetanus, diphtheria, and pertussis (Tdap) booster vaccine.  Influenza every year before the flu season begins.  Pneumonia vaccine.  Shingles vaccine.  Your health care provider may also recommend other immunizations. This information is not intended to replace advice given to you by your health care provider. Make sure you discuss any questions you have with your health care provider. Document Released: 07/30/2005 Document Revised: 12/26/2015 Document Reviewed: 03/11/2015 Elsevier Interactive  Patient Education  2018 [employer].

## 2016-12-02 ENCOUNTER — Telehealth: Payer: Self-pay | Admitting: Internal Medicine

## 2016-12-02 NOTE — Telephone Encounter (Signed)
Kimberly Rodriguez- can you order PROLIA for Pt? Thank you.  

## 2016-12-02 NOTE — Telephone Encounter (Signed)
Prolia Benefits verified NO PA required 20% co-insurance for Prolia $10 copay for admin  Patient may owe approximately $210 OOP  Due after 11/30/16

## 2016-12-07 NOTE — Telephone Encounter (Signed)
Prolia has arrived for patient. Thanks/SLS 06/19

## 2016-12-07 NOTE — Telephone Encounter (Signed)
Letter printed and mailed to Pt, requesting she call and schedule nurse visit for [place].

## 2016-12-15 ENCOUNTER — Other Ambulatory Visit (INDEPENDENT_AMBULATORY_CARE_PROVIDER_SITE_OTHER): Payer: Medicare Other

## 2016-12-15 ENCOUNTER — Ambulatory Visit: Payer: Medicare Other

## 2016-12-15 ENCOUNTER — Telehealth: Payer: Self-pay | Admitting: Behavioral Health

## 2016-12-15 DIAGNOSIS — M810 Age-related osteoporosis without current pathological fracture: Secondary | ICD-10-CM | POA: Diagnosis not present

## 2016-12-15 LAB — BASIC METABOLIC PANEL
BUN: 17 mg/dL (ref 6–23)
CALCIUM: 9.3 mg/dL (ref 8.4–10.5)
CO2: 30 mEq/L (ref 19–32)
Chloride: 104 mEq/L (ref 96–112)
Creatinine, Ser: 0.74 mg/dL (ref 0.40–1.20)
GFR: 78.76 mL/min (ref >60.00)
Glucose, Bld: 85 mg/dL (ref 70–99)
POTASSIUM: 4.2 meq/L (ref 3.5–5.1)
SODIUM: 139 meq/L (ref 135–145)

## 2016-12-15 NOTE — Telephone Encounter (Signed)
Prolia injection rescheduled for 12/21/16 at 10:30 AM. Patient completed BMP lab work today. Awaiting results.

## 2016-12-21 ENCOUNTER — Ambulatory Visit (INDEPENDENT_AMBULATORY_CARE_PROVIDER_SITE_OTHER): Payer: Medicare Other

## 2016-12-21 DIAGNOSIS — M818 Other osteoporosis without current pathological fracture: Secondary | ICD-10-CM

## 2016-12-21 MED ORDER — DENOSUMAB 60 MG/ML ~~LOC~~ SOLN
60.0000 mg | Freq: Once | SUBCUTANEOUS | Status: AC
  Administered 2016-12-21: 60 mg via SUBCUTANEOUS

## 2016-12-21 NOTE — Progress Notes (Addendum)
Pre visit review using our clinic tool,if applicable. No additional management support is needed unless otherwise documented below in the visit note.   Patient in for Prolia Injection per order from Dr. Kathlene November, M.D. due to Patient having a diagnosis of Osteoporosis.  No complaints voiced during this visit.  Given 60 mg SQ left arm. Reminder card given for next injection time frame.  Patient tolerated well   Kathlene November, MD

## 2017-05-04 ENCOUNTER — Encounter: Payer: Self-pay | Admitting: Internal Medicine

## 2017-05-04 ENCOUNTER — Ambulatory Visit (INDEPENDENT_AMBULATORY_CARE_PROVIDER_SITE_OTHER): Payer: Medicare Other | Admitting: Internal Medicine

## 2017-05-04 VITALS — BP 126/72 | HR 74 | Temp 98.2°F | Resp 14 | Ht 60.0 in | Wt 156.4 lb

## 2017-05-04 DIAGNOSIS — M818 Other osteoporosis without current pathological fracture: Secondary | ICD-10-CM

## 2017-05-04 DIAGNOSIS — Z23 Encounter for immunization: Secondary | ICD-10-CM | POA: Diagnosis not present

## 2017-05-04 DIAGNOSIS — Z0000 Encounter for general adult medical examination without abnormal findings: Secondary | ICD-10-CM | POA: Diagnosis not present

## 2017-05-04 LAB — FERRITIN: Ferritin: 7.2 ng/mL — ABNORMAL LOW (ref 10.0–291.0)

## 2017-05-04 LAB — CBC WITH DIFFERENTIAL/PLATELET
BASOS ABS: 0 10*3/uL (ref 0.0–0.1)
Basophils Relative: 0.9 % (ref 0.0–3.0)
EOS ABS: 0.1 10*3/uL (ref 0.0–0.7)
Eosinophils Relative: 2.5 % (ref 0.0–5.0)
HEMATOCRIT: 39.3 % (ref 36.0–46.0)
Hemoglobin: 12.6 g/dL (ref 12.0–15.0)
LYMPHS PCT: 16.4 % (ref 12.0–46.0)
Lymphs Abs: 0.9 10*3/uL (ref 0.7–4.0)
MCHC: 32.1 g/dL (ref 30.0–36.0)
MCV: 92.2 fl (ref 78.0–100.0)
MONO ABS: 0.7 10*3/uL (ref 0.1–1.0)
Monocytes Relative: 13 % — ABNORMAL HIGH (ref 3.0–12.0)
Neutro Abs: 3.8 10*3/uL (ref 1.4–7.7)
Neutrophils Relative %: 67.2 % (ref 43.0–77.0)
Platelets: 241 10*3/uL (ref 150.0–400.0)
RBC: 4.26 Mil/uL (ref 3.87–5.11)
RDW: 16.7 % — ABNORMAL HIGH (ref 11.5–15.5)
WBC: 5.7 10*3/uL (ref 4.0–10.5)

## 2017-05-04 LAB — LIPID PANEL
CHOL/HDL RATIO: 3
Cholesterol: 209 mg/dL — ABNORMAL HIGH (ref 0–200)
HDL: 78.6 mg/dL (ref >39.00)
LDL CALC: 120 mg/dL — AB (ref 0–99)
NONHDL: 130.71
TRIGLYCERIDES: 52 mg/dL (ref 0.0–149.0)
VLDL: 10.4 mg/dL (ref 0.0–40.0)

## 2017-05-04 LAB — HEPATIC FUNCTION PANEL
ALK PHOS: 69 U/L (ref 39–117)
ALT: 15 U/L (ref 0–35)
AST: 20 U/L (ref 0–37)
Albumin: 3.9 g/dL (ref 3.5–5.2)
BILIRUBIN DIRECT: 0.1 mg/dL (ref 0.0–0.3)
BILIRUBIN TOTAL: 0.4 mg/dL (ref 0.2–1.2)
TOTAL PROTEIN: 6.4 g/dL (ref 6.0–8.3)

## 2017-05-04 LAB — IRON: Iron: 60 ug/dL (ref 42–145)

## 2017-05-04 NOTE — Progress Notes (Signed)
Pre visit review using our clinic review tool, if applicable. No additional management support is needed unless otherwise documented below in the visit note. 

## 2017-05-04 NOTE — Assessment & Plan Note (Addendum)
-  Td  2012, pneumonia shot, 2006; prevnar:2015; had a shingles shot already;  flu shot today -No further cervical  Breast or colon cancer screening (did have a colonoscopy 06-2016 D/T anemia) -Diet and exercise discussed  -Labs: LFTs, FLP, CBC, iron, ferritin

## 2017-05-04 NOTE — Progress Notes (Signed)
Subjective:    Patient ID: Kimberly Rodriguez, female    DOB: [birth year], 83 y.o.   MRN: [id number]  DOS:  05/04/2017 Type of visit - description : cpx Interval history: In general feeling well, still living independently.   Review of Systems Reports easy bruising with minor injuries but denies gum bleeding.  No blood in the stools or in the urine.  No hematomas.  Other than above, a 14 point review of systems is negative      Past Medical History:  Diagnosis Date  . Anemia   . Blood transfusion without reported diagnosis   . Cataract   . Eustachian tube dysfunction    Right side, Dr. Laurance Flatten, ENT  . Microhematuria    s/p eval by urology 2008: U/S, CT, cysto w/ neg Bx (dx w/ a renal cyst, see reports)  . Osteoarthritis    h/o back pain, sees ortho s/p shots  . Osteoporosis     Past Surgical History:  Procedure Laterality Date  . ABDOMINAL HYSTERECTOMY     no oophorectomy per pt  . APPENDECTOMY    . COLONOSCOPY    . DILATION AND CURETTAGE OF UTERUS    . TONSILLECTOMY      Social History   Socioeconomic History  . Marital status: Widowed    Spouse name: Not on file  . Number of children: 0  . Years of education: Not on file  . Highest education level: Not on file  Social Needs  . Financial resource strain: Not on file  . Food insecurity - worry: Not on file  . Food insecurity - inability: Not on file  . Transportation needs - medical: Not on file  . Transportation needs - non-medical: Not on file  Occupational History  . Occupation: retired  Tobacco Use  . Smoking status: Former Research scientist (life sciences)  . Smokeless tobacco: Never Used  Substance and Sexual Activity  . Alcohol use: Yes    Comment: socially -wine  . Drug use: No  . Sexual activity: Not on file  Other Topics Concern  . Not on file  Social History Narrative   Household -- lives by herself in a townhouse    Emergency contact-- [name] (sister in law) lives in [place] --[phone]   She is her health POA       Family History  Problem Relation Age of Onset  . Heart disease Father   . Prostate cancer Father   . Heart disease Mother   . Colon cancer Neg Hx   . Breast cancer Neg Hx   . Diabetes Neg Hx      Allergies as of 05/04/2017      Reactions   Astelin [azelastine Hcl] Palpitations      Medication List        Accurate as of 05/04/17  5:22 PM. Always use your most recent med list.          CALTRATE 600 PO Take by mouth.   CENTRUM SILVER Chew Chew by mouth.   denosumab 60 MG/ML Soln injection Commonly known as:  PROLIA Inject 60 mg into the skin every 6 (six) months. Administer in upper arm, thigh, or abdomen   FIBER PO Take by mouth.   fluticasone 50 MCG/ACT nasal spray Commonly known as:  FLONASE Place 2 sprays into both nostrils daily as needed.   STOOL SOFTENER PO Take by mouth.          Objective:   Physical Exam BP 126/72 (BP  Location: Left Arm, Patient Position: Sitting, Cuff Size: Small)   Pulse 74   Temp 98.2 F (36.8 C) (Oral)   Resp 14   Ht 5' (1.524 m)   Wt 156 lb 6 oz (70.9 kg)   SpO2 98%   BMI 30.54 kg/m   General:   Well developed, well nourished . NAD.  Neck: No  thyromegaly  HEENT:  Normocephalic . Face symmetric, atraumatic Lungs:  CTA B Normal respiratory effort, no intercostal retractions, no accessory muscle use. Heart: RRR,  no murmur.  No pretibial edema bilaterally  Abdomen:  Not distended, soft, non-tender. No rebound or rigidity.   Skin: Exposed areas without rash. Not pale. Not jaundice Neurologic:  alert & oriented X3.  Speech normal, gait appropriate for age and unassisted Strength symmetric and appropriate for age.  Psych: Cognition and judgment appear intact.  Cooperative with normal attention span and concentration.  Behavior appropriate. No anxious or depressed appearing.     Assessment & Plan:   Assessment interstitial lung dz per chest x-ray 08-2014, PFTs 08-2014: minimal  obstruction MSK: --DJD --Neck pain --Back pain: h/o acupuncture before Osteoporosis: T score 04-2015 -3.1, prolia #2  11-2015  Microhematuria  s/p eval by urology 2008: U/S, CT, cysto w/ neg Bx (dx w/ a renal cyst, see reports) ET dysfuntion saw ENT Dr Laurance Flatten 2016 Anemia, saw GI: 06-2016: Cscope: polyps, tubular adenoma; EGD: cameron lesions, no Bx H/o Anxiety  PLAN Doing great, lives independently. Interstitial lung disease: Essentially asx Osteoporosis: On Prolia, check a bone density test, on supplements, no history of low vitamin D Iron deficiency anemia: Saw GI, had a C scope and EGD, has been on PPIs for a year, would like to stop. Agreed to stop PPIs, see instructions, start iron OTC (addendum: Intolerant to iron d/t constipation, rec to eat iron-rich food 2- 3 times a week).  Checking labs. Easy  bruising: Without other worrisome symptoms.  Checking a CBC (platelet count) RTC 1 year

## 2017-05-04 NOTE — Assessment & Plan Note (Signed)
Doing great, lives independently. Interstitial lung disease: Essentially asx Osteoporosis: On Prolia, check a bone density test, on supplements, no history of low vitamin D Iron deficiency anemia: Saw GI, had a C scope and EGD, has been on PPIs for a year, would like to stop. Agreed to stop PPIs, see instructions, start iron OTC (addendum: Intolerant to iron d/t constipation, rec to eat iron-rich food 2- 3 times a week).  Checking labs. Easy  bruising: Without other worrisome symptoms.  Checking a CBC (platelet count) RTC 1 year

## 2017-05-04 NOTE — Patient Instructions (Addendum)
GO TO THE LAB : Get the blood work     GO TO THE FRONT DESK Schedule your next appointment for a physical exam in 1 year.  Please consider a Medicare wellness with one of our nurses  Protonix: Decrease to one tablet every other day for 2 weeks then stop and take only if needed if you have heartburn  Start taking OTC iron supplements daily and continue vitamin D daily

## 2017-05-19 DIAGNOSIS — H25012 Cortical age-related cataract, left eye: Secondary | ICD-10-CM | POA: Diagnosis not present

## 2017-05-19 DIAGNOSIS — H52203 Unspecified astigmatism, bilateral: Secondary | ICD-10-CM | POA: Diagnosis not present

## 2017-05-19 DIAGNOSIS — H5203 Hypermetropia, bilateral: Secondary | ICD-10-CM | POA: Diagnosis not present

## 2017-05-19 DIAGNOSIS — H524 Presbyopia: Secondary | ICD-10-CM | POA: Diagnosis not present

## 2017-05-19 DIAGNOSIS — H2512 Age-related nuclear cataract, left eye: Secondary | ICD-10-CM | POA: Diagnosis not present

## 2017-05-20 ENCOUNTER — Other Ambulatory Visit (HOSPITAL_BASED_OUTPATIENT_CLINIC_OR_DEPARTMENT_OTHER): Payer: Medicare Other

## 2017-05-25 ENCOUNTER — Telehealth: Payer: Self-pay | Admitting: Internal Medicine

## 2017-05-25 ENCOUNTER — Ambulatory Visit (HOSPITAL_BASED_OUTPATIENT_CLINIC_OR_DEPARTMENT_OTHER)
Admission: RE | Admit: 2017-05-25 | Discharge: 2017-05-25 | Disposition: A | Discharge status: Home / Self Care | Payer: Medicare Other | Source: Ambulatory Visit | Attending: Internal Medicine | Admitting: Internal Medicine

## 2017-05-25 DIAGNOSIS — M810 Age-related osteoporosis without current pathological fracture: Secondary | ICD-10-CM | POA: Diagnosis not present

## 2017-05-25 DIAGNOSIS — Z87891 Personal history of nicotine dependence: Secondary | ICD-10-CM | POA: Diagnosis not present

## 2017-05-25 DIAGNOSIS — M818 Other osteoporosis without current pathological fracture: Secondary | ICD-10-CM | POA: Diagnosis not present

## 2017-05-25 DIAGNOSIS — Z780 Asymptomatic menopausal state: Secondary | ICD-10-CM | POA: Insufficient documentation

## 2017-05-25 NOTE — Telephone Encounter (Signed)
Prolia benefits received PA NOT required 20% co-insurance for Prolia $30 copay for Admin fee   Patient may owe approximately $240 OOP  Patient due after 06/19/17  Letter mailed to inform patient of benefits and to schedule

## 2017-05-25 NOTE — Telephone Encounter (Signed)
Medication has been ordered.

## 2017-05-25 NOTE — Telephone Encounter (Signed)
Gilmore Laroche- can you order Prolia for Pt please? Thank you.

## 2017-05-27 NOTE — Addendum Note (Signed)
Addended by: Damita Dunnings D on: 05/27/2017 11:30 AM   Modules accepted: Orders

## 2017-05-27 NOTE — Telephone Encounter (Signed)
Letter sent by [place] informing Pt when she is due for Prolia.

## 2017-05-27 NOTE — Telephone Encounter (Signed)
Medication is here for pt.

## 2017-07-12 ENCOUNTER — Telehealth: Payer: Self-pay | Admitting: Endocrinology

## 2017-07-12 ENCOUNTER — Ambulatory Visit: Payer: Medicare Other | Admitting: Endocrinology

## 2017-07-12 ENCOUNTER — Encounter: Payer: Self-pay | Admitting: Endocrinology

## 2017-07-12 ENCOUNTER — Other Ambulatory Visit: Payer: Self-pay

## 2017-07-12 VITALS — BP 172/92 | HR 75 | Wt 157.8 lb

## 2017-07-12 DIAGNOSIS — M818 Other osteoporosis without current pathological fracture: Secondary | ICD-10-CM

## 2017-07-12 LAB — VITAMIN D 25 HYDROXY (VIT D DEFICIENCY, FRACTURES): VITD: 54.15 ng/mL (ref 30.00–100.00)

## 2017-07-12 NOTE — Progress Notes (Signed)
Subjective:    Patient ID: Kimberly Rodriguez, female    DOB: [birth year], 84 y.o.   MRN: [id number]  HPI Pt is referred by Dr Larose Kells, for osteoporosis.  Pt was noted to have osteoporosis in approx 1998.  She took actonel x a few years, and then fosamax until 2017.  She took forteo x 1 year, then stopped in approx 2009.  She has been on prolia since 2017 (total of 3 doses so far).  she has only had these bony fractures: 2 fingers, in approx 2009, with a fall.  She has no history of any of the following: early menopause, multiple myeloma, renal dz, thyroid problems, prolonged bedrest, steroids, alcoholism, liver dz, and primary hyperparathyroidism.  She does not take heparin or anticonvulsants.  She quit smoking in 1963.  She takes vit-D OTC, at an uncertain dosage.  She has moderate cold intolerance, and assoc easy bruising.   Past Medical History:  Diagnosis Date  . Anemia   . Blood transfusion without reported diagnosis   . Cataract   . Eustachian tube dysfunction    Right side, Dr. Laurance Flatten, ENT  . Microhematuria    s/p eval by urology 2008: U/S, CT, cysto w/ neg Bx (dx w/ a renal cyst, see reports)  . Osteoarthritis    h/o back pain, sees ortho s/p shots  . Osteoporosis     Past Surgical History:  Procedure Laterality Date  . ABDOMINAL HYSTERECTOMY     no oophorectomy per pt  . APPENDECTOMY    . COLONOSCOPY    . DILATION AND CURETTAGE OF UTERUS    . TONSILLECTOMY      Social History   Socioeconomic History  . Marital status: Widowed    Spouse name: Not on file  . Number of children: 0  . Years of education: Not on file  . Highest education level: Not on file  Social Needs  . Financial resource strain: Not on file  . Food insecurity - worry: Not on file  . Food insecurity - inability: Not on file  . Transportation needs - medical: Not on file  . Transportation needs - non-medical: Not on file  Occupational History  . Occupation: retired  Tobacco Use  . Smoking status: Former  Research scientist (life sciences)  . Smokeless tobacco: Never Used  Substance and Sexual Activity  . Alcohol use: Yes    Comment: socially -wine  . Drug use: No  . Sexual activity: Not on file  Other Topics Concern  . Not on file  Social History Narrative   Household -- lives by herself in a townhouse    Emergency contact-- [name] (sister in law) lives in [place] --[phone]   She is her health POA    Current Outpatient Medications on File Prior to Visit  Medication Sig Dispense Refill  . Calcium Carbonate (CALTRATE 600 PO) Take by mouth.      Mariane Baumgarten Calcium (STOOL SOFTENER PO) Take by mouth.    . FIBER PO Take by mouth.      . fluticasone (FLONASE) 50 MCG/ACT nasal spray Place 2 sprays into both nostrils daily as needed.     . Multiple Vitamins-Minerals (CENTRUM SILVER) CHEW Chew by mouth.      . denosumab (PROLIA) 60 MG/ML SOLN injection Inject 60 mg into the skin every 6 (six) months. Administer in upper arm, thigh, or abdomen     Current Facility-Administered Medications on File Prior to Visit  Medication Dose Route Frequency Provider Last Rate  Last Dose  . 0.9 %  sodium chloride infusion  500 mL Intravenous Continuous Nandigam, Venia Minks, MD        Allergies  Allergen Reactions  . Astelin [Azelastine Hcl] Palpitations    Family History  Problem Relation Age of Onset  . Heart disease Father   . Prostate cancer Father   . Heart disease Mother   . Colon cancer Neg Hx   . Breast cancer Neg Hx   . Diabetes Neg Hx   . Osteoporosis Neg Hx     BP (!) 172/92 (BP Location: Left Arm, Patient Position: Sitting, Cuff Size: Normal)   Pulse 75   Wt 157 lb 12.8 oz (71.6 kg)   SpO2 98%   BMI 30.82 kg/m    Review of Systems denies weight loss, hematuria, edema, skin rash, insomnia, falls, cramps, memory loss, menopausal sxs, and rhinorrhea.  She has chronic mid-back pain.  Heartburn is well-controlled (on pantoprazole, although it is not on med list)    Objective:   Physical Exam VS: see vs  page GEN: no distress HEAD: head: no deformity eyes: no periorbital swelling, no proptosis external nose and ears are normal mouth: no lesion seen NECK: supple, thyroid is not enlarged CHEST WALL: no deformity.  No kyphosis LUNGS: clear to auscultation CV: reg rate and rhythm, no murmur ABD: abdomen is soft, nontender.  no hepatosplenomegaly.  not distended.  no hernia MUSCULOSKELETAL: muscle bulk and strength are grossly normal.  no obvious joint swelling.  gait is normal and steady EXTEMITIES: no deformity.  no edema PULSES: no carotid bruit NEURO:  cn 2-12 grossly intact.   readily moves all 4's.  sensation is intact to touch on all 4's SKIN:  Normal texture and temperature.  No rash or suspicious lesion is visible.   NODES:  None palpable at the neck PSYCH: alert, well-oriented.  Does not appear anxious nor depressed.  The BMD measured at Forearm Radius 33% is 0.581 g/cm2 with a T-score of -3.4. T-score DualFemur Neck Left 05/25/2017 88.2 years Osteopenia -1.7 0.797 g/cm2 Left Forearm Radius 33% 05/25/2017 88.2 years Osteoporosis -3.4 0.581 G/cm2.   Lab Results  Component Value Date   CALCIUM 9.3 12/15/2016   Lab Results  Component Value Date   CREATININE 0.74 12/15/2016   BUN 17 12/15/2016   NA 139 12/15/2016   K 4.2 12/15/2016   CL 104 12/15/2016   CO2 30 12/15/2016   Lab Results  Component Value Date   TSH 2.39 04/29/2016   Lab Results  Component Value Date   ALT 15 05/04/2017   AST 20 05/04/2017   ALKPHOS 69 05/04/2017   BILITOT 0.4 05/04/2017   I have reviewed outside records, and summarized: Pt was noted to have osteoporosis, and referred here.  Other than this and mildly easy bruising health was stable.     Assessment & Plan:  Osteoporosis, new to me: resistant to multiple rxs.   Patient Instructions  blood tests are requested for you today.  We'll let you know about the results. Then we'll decide what to do It is critically important to prevent  falling down (keep floor areas well-lit, dry, and free of loose objects.  If you have a cane, walker, or wheelchair, you should use it, even for short trips around the house.  Wear flat-soled shoes.  Also, try not to rush).

## 2017-07-12 NOTE — Telephone Encounter (Signed)
Thank you.  This will help when I see your blood test results.

## 2017-07-12 NOTE — Telephone Encounter (Signed)
Pt states she has some questions about her visit today. She is unsure of the medication that is being sent over for her.    Please advise

## 2017-07-12 NOTE — Patient Instructions (Signed)
blood tests are requested for you today.  We'll let you know about the results. Then we'll decide what to do It is critically important to prevent falling down (keep floor areas well-lit, dry, and free of loose objects.  If you have a cane, walker, or wheelchair, you should use it, even for short trips around the house.  Wear flat-soled shoes.  Also, try not to rush).

## 2017-07-12 NOTE — Telephone Encounter (Signed)
I called patient & she just wanted to let you know she was taking Calcium 600 mg as well as 800 iu of Vitamin D.

## 2017-07-14 ENCOUNTER — Telehealth: Payer: Self-pay | Admitting: Internal Medicine

## 2017-07-14 LAB — PROTEIN ELECTROPHORESIS, SERUM
ABNORMAL PROTEIN BAND1: 0.3 g/dL — AB
Albumin ELP: 3.9 g/dL (ref 3.8–4.8)
Alpha 1: 0.3 g/dL (ref 0.2–0.3)
Alpha 2: 0.9 g/dL (ref 0.5–0.9)
BETA 2: 0.3 g/dL (ref 0.2–0.5)
Beta Globulin: 0.5 g/dL (ref 0.4–0.6)
GAMMA GLOBULIN: 0.9 g/dL (ref 0.8–1.7)
Total Protein: 6.7 g/dL (ref 6.1–8.1)

## 2017-07-14 LAB — PTH, INTACT AND CALCIUM
Calcium: 9.7 mg/dL (ref 8.6–10.4)
PTH: 52 pg/mL (ref 14–64)

## 2017-07-14 NOTE — Telephone Encounter (Signed)
Prolia benefits received PA not required 20% co-insurance $10 copay   Patient may owe approximately $220 OOP  Patient due after 06/19/17  Letter mailed to inform patient of benefits and to schedule

## 2017-07-25 NOTE — Telephone Encounter (Signed)
Pt seeing Dr. Loanne Drilling now- worsening T-score on Prolia.

## 2017-07-25 NOTE — Telephone Encounter (Signed)
Error

## 2017-07-27 NOTE — Telephone Encounter (Signed)
Pt is calling to get her lab results.     Pt states that we can leave a detailed message if she doesn't answer.

## 2017-07-27 NOTE — Telephone Encounter (Signed)
LVM about labs from 07/12/17 being as those are the most recent

## 2017-10-18 ENCOUNTER — Other Ambulatory Visit: Payer: Self-pay | Admitting: Internal Medicine

## 2017-10-18 DIAGNOSIS — Z1231 Encounter for screening mammogram for malignant neoplasm of breast: Secondary | ICD-10-CM

## 2017-10-20 DIAGNOSIS — L255 Unspecified contact dermatitis due to plants, except food: Secondary | ICD-10-CM | POA: Diagnosis not present

## 2017-11-23 ENCOUNTER — Ambulatory Visit (HOSPITAL_BASED_OUTPATIENT_CLINIC_OR_DEPARTMENT_OTHER)
Admission: RE | Admit: 2017-11-23 | Discharge: 2017-11-23 | Disposition: A | Discharge status: Home / Self Care | Payer: Medicare Other | Source: Ambulatory Visit | Attending: Internal Medicine | Admitting: Internal Medicine

## 2017-11-23 DIAGNOSIS — Z1231 Encounter for screening mammogram for malignant neoplasm of breast: Secondary | ICD-10-CM | POA: Diagnosis not present

## 2017-11-24 ENCOUNTER — Telehealth: Payer: Self-pay | Admitting: *Deleted

## 2017-11-24 MED ORDER — PANTOPRAZOLE SODIUM 20 MG PO TBEC: 20.0000 mg | DELAYED_RELEASE_TABLET | Freq: Every day | ORAL | 3 refills | Status: DC

## 2017-11-24 NOTE — Telephone Encounter (Signed)
Fax request refills for Protonix 20 mg daily as noted on EGD report

## 2017-11-30 NOTE — Progress Notes (Addendum)
Subjective:   Kimberly Rodriguez is a 84 y.o. female who presents for Medicare Annual (Subsequent) preventive examination.  Pt states she does a lot with her group of church friends. She recently went to a baseball game with them and really enjoyed that.   Review of Systems:  No ROS.  Medicare Wellness Visit. Additional risk factors are reflected in the social history. Cardiac Risk Factors include: advanced age (>1men, >83 women) Sleep patterns: sleeps 7-8 hrs per night. Feels rested   Home Safety/Smoke Alarms: Feels safe in home. Smoke alarms in place.  Living environment; residence and Firearm Safety: Lives alone in 1 story home. Tub with grab bars.    Female:       Mammo- utd      Dexa scan- utd    Objective:     Vitals: BP 120/76 (BP Location: Left Arm, Patient Position: Sitting, Cuff Size: Normal)   Pulse 73   Ht 5' (1.524 m)   Wt 158 lb (71.7 kg)   SpO2 96%   BMI 30.86 kg/m   Body mass index is 30.86 kg/m.  Advanced Directives 12/02/2017 12/01/2016  Does Patient Have a Medical Advance Directive? Yes Yes  Type of Paramedic of [place];Living will [place];Living will  Copy of [place] in Chart? No - copy requested No - copy requested    Tobacco Social History   Tobacco Use  Smoking Status Former Smoker  Smokeless Tobacco Never Used     Counseling given: Not Answered   Clinical Intake: Pain : No/denies pain   Past Medical History:  Diagnosis Date  . Anemia   . Blood transfusion without reported diagnosis   . Cataract   . Eustachian tube dysfunction    Right side, Dr. Laurance Flatten, ENT  . Microhematuria    s/p eval by urology 2008: U/S, CT, cysto w/ neg Bx (dx w/ a renal cyst, see reports)  . Osteoarthritis    h/o back pain, sees ortho s/p shots  . Osteoporosis    Past Surgical History:  Procedure Laterality Date  . ABDOMINAL HYSTERECTOMY     no oophorectomy per pt  . APPENDECTOMY    .  COLONOSCOPY    . DILATION AND CURETTAGE OF UTERUS    . TONSILLECTOMY     Family History  Problem Relation Age of Onset  . Heart disease Father   . Prostate cancer Father   . Heart disease Mother   . Colon cancer Neg Hx   . Breast cancer Neg Hx   . Diabetes Neg Hx   . Osteoporosis Neg Hx    Social History   Socioeconomic History  . Marital status: Widowed    Spouse name: Not on file  . Number of children: 0  . Years of education: Not on file  . Highest education level: Not on file  Occupational History  . Occupation: retired  Scientific laboratory technician  . Financial resource strain: Not on file  . Food insecurity:    Worry: Not on file    Inability: Not on file  . Transportation needs:    Medical: Not on file    Non-medical: Not on file  Tobacco Use  . Smoking status: Former Research scientist (life sciences)  . Smokeless tobacco: Never Used  Substance and Sexual Activity  . Alcohol use: Yes    Comment: socially -wine  . Drug use: No  . Sexual activity: Not on file  Lifestyle  . Physical activity:  Days per week: Not on file    Minutes per session: Not on file  . Stress: Not on file  Relationships  . Social connections:    Talks on phone: Not on file    Gets together: Not on file    Attends religious service: Not on file    Active member of club or organization: Not on file    Attends meetings of clubs or organizations: Not on file    Relationship status: Not on file  Other Topics Concern  . Not on file  Social History Narrative   Household -- lives by herself in a townhouse    Emergency contact-- [name] (sister in law) lives in [place] --[phone]   She is her health POA    Outpatient Encounter Medications as of 12/02/2017  Medication Sig  . Calcium Carbonate (CALTRATE 600 PO) Take by mouth.    Mariane Baumgarten Calcium (STOOL SOFTENER PO) Take by mouth.  . FIBER PO Take by mouth.    . fluticasone (FLONASE) 50 MCG/ACT nasal spray Place 2 sprays into both nostrils daily as needed.   . Multiple  Vitamins-Minerals (CENTRUM SILVER) CHEW Chew by mouth.    . pantoprazole (PROTONIX) 20 MG tablet Take 1 tablet (20 mg total) by mouth daily.  Marland Kitchen denosumab (PROLIA) 60 MG/ML SOLN injection Inject 60 mg into the skin every 6 (six) months. Administer in upper arm, thigh, or abdomen   Facility-Administered Encounter Medications as of 12/02/2017  Medication  . 0.9 %  sodium chloride infusion    Activities of Daily Living In your present state of health, do you have any difficulty performing the following activities: 12/02/2017  Hearing? Y  Comment Not interested in hearing aids at this point.   Vision? N  Difficulty concentrating or making decisions? N  Walking or climbing stairs? N  Dressing or bathing? N  Doing errands, shopping? N  Preparing Food and eating ? N  Using the Toilet? N  In the past six months, have you accidently leaked urine? Y  Comment weats pads  Do you have problems with loss of bowel control? N  Managing your Medications? N  Managing your Finances? N  Housekeeping or managing your Housekeeping? N  Some recent data might be hidden    Patient Care Team: Colon Branch, MD as PCP - General Roel Cluck, MD as Referring Physician (Ophthalmology) Maggie Schwalbe., MD (Family Medicine)    Assessment:   This is a routine wellness examination for Kimberly Rodriguez. Physical assessment deferred to PCP.  Exercise Activities and Dietary recommendations Current Exercise Habits: The patient does not participate in regular exercise at present, Intensity: Mild, Exercise limited by: None identified   Diet (meal preparation, eat out, water intake, caffeinated beverages, dairy products, fruits and vegetables): well balanced, on average, 3 meals per day Breakfast:fruit, coffee, juice Lunch: yogurt, crackers, water Dinner: chicken, vegetable,tea     Pt reports she needs to drink more water.  Goals    . DIET - INCREASE WATER INTAKE    . Increase physical activity     Do  weightbearing exercises        Fall Risk Fall Risk  12/02/2017 05/04/2017 04/29/2016 12/02/2015 08/04/2015  Falls in the past year? No No No No Yes  Number falls in past yr: - - - - 1  Injury with Fall? - - - - No  Follow up - - - - Falls evaluation completed;Education provided   Depression Screen [facility] 2/9  Scores 12/02/2017 05/04/2017 04/29/2016 12/02/2015  PHQ - 2 Score 0 0 0 0     Cognitive Function MMSE - Mini Mental State Exam 12/02/2017 12/01/2016  Orientation to time 5 5  Orientation to Place 5 5  Registration 3 3  Attention/ Calculation 5 5  Recall 3 2  Language- name 2 objects 2 2  Language- repeat 1 1  Language- follow 3 step command 3 3  Language- read & follow direction 1 1  Write a sentence 1 1  Copy design 1 1  Total score 30 29        Immunization History  Administered Date(s) Administered  . Influenza Split 02/29/2012  . Influenza Whole 04/17/2008  . Influenza, High Dose Seasonal PF 04/01/2015, 04/29/2016, 05/04/2017  . Influenza,inj,Quad PF,6+ Mos 03/27/2014  . Pneumococcal Conjugate-13 03/27/2014  . Pneumococcal Polysaccharide-23 09/30/2004  . Td 03/03/2001  . Tdap 12/11/2010  . Zoster 11/29/2008   Screening Tests Health Maintenance  Topic Date Due  . INFLUENZA VACCINE  01/19/2018  . DEXA SCAN  05/26/2019  . TETANUS/TDAP  12/10/2020  . PNA vac Low Risk Adult  Completed      Plan:     Please schedule your next medicare wellness visit with me in 1 yr.  Follow up with Dr.Paz as scheduled 05/10/18.  Continue to eat heart healthy diet (full of fruits, vegetables, whole grains, lean protein, water--limit salt, fat, and sugar intake) and increase physical activity as tolerated.  Continue doing brain stimulating activities (puzzles, reading, adult coloring books, staying active) to keep memory sharp.     I have personally reviewed and noted the following in the patient's chart:   . Medical and social history . Use of alcohol, tobacco or illicit  drugs  . Current medications and supplements . Functional ability and status . Nutritional status . Physical activity . Advanced directives . List of other physicians . Hospitalizations, surgeries, and ER visits in previous 12 months . Vitals . Screenings to include cognitive, depression, and falls . Referrals and appointments  In addition, I have reviewed and discussed with patient certain preventive protocols, quality metrics, and best practice recommendations. A written personalized care plan for preventive services as well as general preventive health recommendations were provided to patient.     [name] [place], [place]  12/02/2017  Kathlene November, MD

## 2017-12-02 ENCOUNTER — Ambulatory Visit (INDEPENDENT_AMBULATORY_CARE_PROVIDER_SITE_OTHER): Payer: Medicare Other | Admitting: *Deleted

## 2017-12-02 ENCOUNTER — Encounter: Payer: Self-pay | Admitting: *Deleted

## 2017-12-02 VITALS — BP 120/76 | HR 73 | Ht 60.0 in | Wt 158.0 lb

## 2017-12-02 DIAGNOSIS — Z0000 Encounter for general adult medical examination without abnormal findings: Secondary | ICD-10-CM

## 2017-12-02 NOTE — Patient Instructions (Signed)
Please schedule your next medicare wellness visit with me in 1 yr.  Follow up with Dr.Paz as scheduled 05/10/18.  Continue to eat heart healthy diet (full of fruits, vegetables, whole grains, lean protein, water--limit salt, fat, and sugar intake) and increase physical activity as tolerated.  Continue doing brain stimulating activities (puzzles, reading, adult coloring books, staying active) to keep memory sharp.    Kimberly Rodriguez , Thank you for taking time to come for your Medicare Wellness Visit. I appreciate your ongoing commitment to your health goals. Please review the following plan we discussed and let me know if I can assist you in the future.   These are the goals we discussed: Goals    . DIET - INCREASE WATER INTAKE    . Increase physical activity     Do weightbearing exercises        This is a list of the screening recommended for you and due dates:  Health Maintenance  Topic Date Due  . Flu Shot  01/19/2018  . DEXA scan (bone density measurement)  05/26/2019  . Tetanus Vaccine  12/10/2020  . Pneumonia vaccines  Completed    Health Maintenance for Postmenopausal Women Menopause is a normal process in which your reproductive ability comes to an end. This process happens gradually over a span of months to years, usually between the ages of 33 and 74. Menopause is complete when you have missed 12 consecutive menstrual periods. It is important to talk with your health care provider about some of the most common conditions that affect postmenopausal women, such as heart disease, cancer, and bone loss (osteoporosis). Adopting a healthy lifestyle and getting preventive care can help to promote your health and wellness. Those actions can also lower your chances of developing some of these common conditions. What should I know about menopause? During menopause, you may experience a number of symptoms, such as:  Moderate-to-severe hot flashes.  Night sweats.  Decrease in sex  drive.  Mood swings.  Headaches.  Tiredness.  Irritability.  Memory problems.  Insomnia.  Choosing to treat or not to treat menopausal changes is an individual decision that you make with your health care provider. What should I know about hormone replacement therapy and supplements? Hormone therapy products are effective for treating symptoms that are associated with menopause, such as hot flashes and night sweats. Hormone replacement carries certain risks, especially as you become older. If you are thinking about using estrogen or estrogen with progestin treatments, discuss the benefits and risks with your health care provider. What should I know about heart disease and stroke? Heart disease, heart attack, and stroke become more likely as you age. This may be due, in part, to the hormonal changes that your body experiences during menopause. These can affect how your body processes dietary fats, triglycerides, and cholesterol. Heart attack and stroke are both medical emergencies. There are many things that you can do to help prevent heart disease and stroke:  Have your blood pressure checked at least every 1-2 years. High blood pressure causes heart disease and increases the risk of stroke.  If you are 94-65 years old, ask your health care provider if you should take aspirin to prevent a heart attack or a stroke.  Do not use any tobacco products, including cigarettes, chewing tobacco, or electronic cigarettes. If you need help quitting, ask your health care provider.  It is important to eat a healthy diet and maintain a healthy weight. ? Be sure to include plenty  of vegetables, fruits, low-fat dairy products, and lean protein. ? Avoid eating foods that are high in solid fats, added sugars, or salt (sodium).  Get regular exercise. This is one of the most important things that you can do for your health. ? Try to exercise for at least 150 minutes each week. The type of exercise that  you do should increase your heart rate and make you sweat. This is known as moderate-intensity exercise. ? Try to do strengthening exercises at least twice each week. Do these in addition to the moderate-intensity exercise.  Know your numbers.Ask your health care provider to check your cholesterol and your blood glucose. Continue to have your blood tested as directed by your health care provider.  What should I know about cancer screening? There are several types of cancer. Take the following steps to reduce your risk and to catch any cancer development as early as possible. Breast Cancer  Practice breast self-awareness. ? This means understanding how your breasts normally appear and feel. ? It also means doing regular breast self-exams. Let your health care provider know about any changes, no matter how small.  If you are 8 or older, have a clinician do a breast exam (clinical breast exam or CBE) every year. Depending on your age, family history, and medical history, it may be recommended that you also have a yearly breast X-ray (mammogram).  If you have a family history of breast cancer, talk with your health care provider about genetic screening.  If you are at high risk for breast cancer, talk with your health care provider about having an MRI and a mammogram every year.  Breast cancer (BRCA) gene test is recommended for women who have family members with BRCA-related cancers. Results of the assessment will determine the need for genetic counseling and BRCA1 and for BRCA2 testing. BRCA-related cancers include these types: ? Breast. This occurs in males or females. ? Ovarian. ? Tubal. This may also be called fallopian tube cancer. ? Cancer of the abdominal or pelvic lining (peritoneal cancer). ? Prostate. ? Pancreatic.  Cervical, Uterine, and Ovarian Cancer Your health care provider may recommend that you be screened regularly for cancer of the pelvic organs. These include your  ovaries, uterus, and vagina. This screening involves a pelvic exam, which includes checking for microscopic changes to the surface of your cervix (Pap test).  For women ages 21-65, health care providers may recommend a pelvic exam and a Pap test every three years. For women ages 11-65, they may recommend the Pap test and pelvic exam, combined with testing for human papilloma virus (HPV), every five years. Some types of HPV increase your risk of cervical cancer. Testing for HPV may also be done on women of any age who have unclear Pap test results.  Other health care providers may not recommend any screening for nonpregnant women who are considered low risk for pelvic cancer and have no symptoms. Ask your health care provider if a screening pelvic exam is right for you.  If you have had past treatment for cervical cancer or a condition that could lead to cancer, you need Pap tests and screening for cancer for at least 20 years after your treatment. If Pap tests have been discontinued for you, your risk factors (such as having a new sexual partner) need to be reassessed to determine if you should start having screenings again. Some women have medical problems that increase the chance of getting cervical cancer. In these cases, your health  care provider may recommend that you have screening and Pap tests more often.  If you have a family history of uterine cancer or ovarian cancer, talk with your health care provider about genetic screening.  If you have vaginal bleeding after reaching menopause, tell your health care provider.  There are currently no reliable tests available to screen for ovarian cancer.  Lung Cancer Lung cancer screening is recommended for adults 2-18 years old who are at high risk for lung cancer because of a history of smoking. A yearly low-dose CT scan of the lungs is recommended if you:  Currently smoke.  Have a history of at least 30 pack-years of smoking and you currently  smoke or have quit within the past 15 years. A pack-year is smoking an average of one pack of cigarettes per day for one year.  Yearly screening should:  Continue until it has been 15 years since you quit.  Stop if you develop a health problem that would prevent you from having lung cancer treatment.  Colorectal Cancer  This type of cancer can be detected and can often be prevented.  Routine colorectal cancer screening usually begins at age 59 and continues through age 28.  If you have risk factors for colon cancer, your health care provider may recommend that you be screened at an earlier age.  If you have a family history of colorectal cancer, talk with your health care provider about genetic screening.  Your health care provider may also recommend using home test kits to check for hidden blood in your stool.  A small camera at the end of a tube can be used to examine your colon directly (sigmoidoscopy or colonoscopy). This is done to check for the earliest forms of colorectal cancer.  Direct examination of the colon should be repeated every 5-10 years until age 60. However, if early forms of precancerous polyps or small growths are found or if you have a family history or genetic risk for colorectal cancer, you may need to be screened more often.  Skin Cancer  Check your skin from head to toe regularly.  Monitor any moles. Be sure to tell your health care provider: ? About any new moles or changes in moles, especially if there is a change in a mole's shape or color. ? If you have a mole that is larger than the size of a pencil eraser.  If any of your family members has a history of skin cancer, especially at a young age, talk with your health care provider about genetic screening.  Always use sunscreen. Apply sunscreen liberally and repeatedly throughout the day.  Whenever you are outside, protect yourself by wearing long sleeves, pants, a wide-brimmed hat, and  sunglasses.  What should I know about osteoporosis? Osteoporosis is a condition in which bone destruction happens more quickly than new bone creation. After menopause, you may be at an increased risk for osteoporosis. To help prevent osteoporosis or the bone fractures that can happen because of osteoporosis, the following is recommended:  If you are 64-58 years old, get at least 1,000 mg of calcium and at least 600 mg of vitamin D per day.  If you are older than age 51 but younger than age 68, get at least 1,200 mg of calcium and at least 600 mg of vitamin D per day.  If you are older than age 85, get at least 1,200 mg of calcium and at least 800 mg of vitamin D per day.  Smoking  and excessive alcohol intake increase the risk of osteoporosis. Eat foods that are rich in calcium and vitamin D, and do weight-bearing exercises several times each week as directed by your health care provider. What should I know about how menopause affects my mental health? Depression may occur at any age, but it is more common as you become older. Common symptoms of depression include:  Low or sad mood.  Changes in sleep patterns.  Changes in appetite or eating patterns.  Feeling an overall lack of motivation or enjoyment of activities that you previously enjoyed.  Frequent crying spells.  Talk with your health care provider if you think that you are experiencing depression. What should I know about immunizations? It is important that you get and maintain your immunizations. These include:  Tetanus, diphtheria, and pertussis (Tdap) booster vaccine.  Influenza every year before the flu season begins.  Pneumonia vaccine.  Shingles vaccine.  Your health care provider may also recommend other immunizations. This information is not intended to replace advice given to you by your health care provider. Make sure you discuss any questions you have with your health care provider. Document Released:  07/30/2005 Document Revised: 12/26/2015 Document Reviewed: 03/11/2015 Elsevier Interactive Patient Education  2018 [employer].

## 2018-01-10 DIAGNOSIS — L821 Other seborrheic keratosis: Secondary | ICD-10-CM | POA: Diagnosis not present

## 2018-01-10 DIAGNOSIS — L820 Inflamed seborrheic keratosis: Secondary | ICD-10-CM | POA: Diagnosis not present

## 2018-01-10 DIAGNOSIS — L578 Other skin changes due to chronic exposure to nonionizing radiation: Secondary | ICD-10-CM | POA: Diagnosis not present

## 2018-03-01 DIAGNOSIS — H10029 Other mucopurulent conjunctivitis, unspecified eye: Secondary | ICD-10-CM | POA: Diagnosis not present

## 2018-03-03 ENCOUNTER — Other Ambulatory Visit: Payer: Self-pay

## 2018-03-03 ENCOUNTER — Emergency Department (INDEPENDENT_AMBULATORY_CARE_PROVIDER_SITE_OTHER)
Admission: EM | Admit: 2018-03-03 | Discharge: 2018-03-03 | Disposition: A | Discharge status: Home / Self Care | Payer: Medicare Other | Source: Home / Self Care

## 2018-03-03 ENCOUNTER — Emergency Department (INDEPENDENT_AMBULATORY_CARE_PROVIDER_SITE_OTHER): Payer: Medicare Other

## 2018-03-03 ENCOUNTER — Encounter: Payer: Self-pay | Admitting: Emergency Medicine

## 2018-03-03 ENCOUNTER — Ambulatory Visit: Payer: Self-pay

## 2018-03-03 DIAGNOSIS — M5416 Radiculopathy, lumbar region: Secondary | ICD-10-CM | POA: Diagnosis not present

## 2018-03-03 DIAGNOSIS — S32020A Wedge compression fracture of second lumbar vertebra, initial encounter for closed fracture: Secondary | ICD-10-CM

## 2018-03-03 DIAGNOSIS — M545 Low back pain, unspecified: Secondary | ICD-10-CM

## 2018-03-03 NOTE — Telephone Encounter (Signed)
Pt. Reports 2 days of low back pain. Does not remember an injury or any heavy lifting. Hurts more with getting out of a chair or out of bed. Has tried Tylenol and heat with little relief. Denies any numbness or weakness. No availability in office today. Offered appointment at another location. Refuses. Appointment made for Monday with her provider. Pt. States she may consider going to Urgent Care.  Reason for Disposition . [1] MODERATE back pain (e.g., interferes with normal activities) AND [2] present > 3 days  Answer Assessment - Initial Assessment Questions 1. ONSET: "When did the pain begin?"     2 days ago 2. LOCATION: "Where does it hurt?" (upper, mid or lower back)     Low back 3. SEVERITY: "How bad is the pain?"  (e.g., Scale 1-10; mild, moderate, or severe)   - MILD (1-3): doesn't interfere with normal activities    - MODERATE (4-7): interferes with normal activities or awakens from sleep    - SEVERE (8-10): excruciating pain, unable to do any normal activities      With movement 5-6 early in the morning 4. PATTERN: "Is the pain constant?" (e.g., yes, no; constant, intermittent)      Hurts with movement 5. RADIATION: "Does the pain shoot into your legs or elsewhere?"     No 6. CAUSE:  "What do you think is causing the back pain?"      Unsure 7. BACK OVERUSE:  "Any recent lifting of heavy objects, strenuous work or exercise?"     No 8. MEDICATIONS: "What have you taken so far for the pain?" (e.g., nothing, acetaminophen, NSAIDS)     Tylenol 9. NEUROLOGIC SYMPTOMS: "Do you have any weakness, numbness, or problems with bowel/bladder control?"     No 10. OTHER SYMPTOMS: "Do you have any other symptoms?" (e.g., fever, abdominal pain, burning with urination, blood in urine)       No 11. PREGNANCY: "Is there any chance you are pregnant?" (e.g., yes, no; LMP)       n/a  Protocols used: BACK PAIN-A-AH

## 2018-03-03 NOTE — ED Provider Notes (Signed)
[facility]    CSN: [id number] Arrival date & time: 03/03/18  1130     History   Chief Complaint Chief Complaint  Patient presents with  . Back Pain    HPI Kimberly Rodriguez is a 84 y.o. female.   The history is provided by the patient. No language interpreter was used.  Back Pain  Location:  Lumbar spine Quality:  Aching Radiates to:  Does not radiate Pain severity:  Moderate Pain is:  Worse during the day Onset quality:  Sudden Timing:  Constant Progression:  Worsening Chronicity:  New Relieved by:  Nothing Worsened by:  Nothing Ineffective treatments:  None tried Associated symptoms: no leg pain, no tingling and no weakness   Risk factors: no vascular disease   Pt complains of pain in her low back.  Pt reports   Past Medical History:  Diagnosis Date  . Anemia   . Blood transfusion without reported diagnosis   . Cataract   . Eustachian tube dysfunction    Right side, Dr. Laurance Flatten, ENT  . Microhematuria    s/p eval by urology 2008: U/S, CT, cysto w/ neg Bx (dx w/ a renal cyst, see reports)  . Osteoarthritis    h/o back pain, sees ortho s/p shots  . Osteoporosis     Patient Active Problem List   Diagnosis Date Noted  . PCP NOTES >>>>>>>>>>>>>>>>>>>>> 04/01/2015  . Lung disease--diffuse interstitial lung disease by x-ray 08-2014 09/05/2014  . Anxiety 02/29/2012  . Annual physical exam 12/11/2010  . DJD (degenerative joint disease) 10/24/2009  . Osteoporosis 06/05/2009    Past Surgical History:  Procedure Laterality Date  . ABDOMINAL HYSTERECTOMY     no oophorectomy per pt  . APPENDECTOMY    . COLONOSCOPY    . DILATION AND CURETTAGE OF UTERUS    . TONSILLECTOMY      OB History   None      Home Medications    Prior to Admission medications   Medication Sig Start Date End Date Taking? Authorizing Provider  Calcium Carbonate (CALTRATE 600 PO) Take by mouth.      [provider]  denosumab (PROLIA) 60 MG/ML SOLN injection  Inject 60 mg into the skin every 6 (six) months. Administer in upper arm, thigh, or abdomen    [provider]  Docusate Calcium (STOOL SOFTENER PO) Take by mouth.    [provider]  FIBER PO Take by mouth.      [provider]  fluticasone (FLONASE) 50 MCG/ACT nasal spray Place 2 sprays into both nostrils daily as needed.  10/28/14   Colon Branch, MD  Multiple Vitamins-Minerals (CENTRUM SILVER) CHEW Chew by mouth.      [provider]  pantoprazole (PROTONIX) 20 MG tablet Take 1 tablet (20 mg total) by mouth daily. 11/24/17   Mauri Pole, MD    Family History Family History  Problem Relation Age of Onset  . Heart disease Father   . Prostate cancer Father   . Heart disease Mother   . Colon cancer Neg Hx   . Breast cancer Neg Hx   . Diabetes Neg Hx   . Osteoporosis Neg Hx     Social History Social History   Tobacco Use  . Smoking status: Former Research scientist (life sciences)  . Smokeless tobacco: Never Used  Substance Use Topics  . Alcohol use: Yes    Comment: socially -wine  . Drug use: No     Allergies   Astelin Lennie Muckle  hcl]   Review of Systems Review of Systems  Musculoskeletal: Positive for back pain.  Neurological: Negative for tingling and weakness.  All other systems reviewed and are negative.    Physical Exam Triage Vital Signs ED Triage Vitals  Enc Vitals Group     BP 03/03/18 1158 120/78     Pulse Rate 03/03/18 1158 (!) 102     Resp 03/03/18 1158 18     Temp 03/03/18 1158 98.5 F (36.9 C)     Temp Source 03/03/18 1158 Oral     SpO2 03/03/18 1158 95 %     Weight 03/03/18 1159 154 lb (69.9 kg)     Height 03/03/18 1159 5\' 4"  (1.626 m)     Head Circumference --      Peak Flow --      Pain Score 03/03/18 1159 4     Pain Loc --      Pain Edu? --      Excl. in [place]? --    No data found.  Updated Vital Signs BP 120/78 (BP Location: Right Arm)   Pulse (!) 102   Temp 98.5 F (36.9 C) (Oral)   Resp 18   Ht 5\' 4"  (1.626 m)   Wt  154 lb (69.9 kg)   SpO2 95%   BMI 26.43 kg/m   Visual Acuity Right Eye Distance:   Left Eye Distance:   Bilateral Distance:    Right Eye Near:   Left Eye Near:    Bilateral Near:     Physical Exam  Constitutional: She is oriented to person, place, and time. She appears well-developed and well-nourished.  HENT:  Head: Normocephalic.  Eyes: EOM are normal.  Neck: Normal range of motion.  Pulmonary/Chest: Effort normal.  Abdominal: She exhibits no distension.  Musculoskeletal: Normal range of motion.  Tender right low back and right flank area   Neurological: She is alert and oriented to person, place, and time.  Psychiatric: She has a normal mood and affect.  Nursing note and vitals reviewed.    UC Treatments / Results  Labs (all labs ordered are listed, but only abnormal results are displayed) Labs Reviewed - No data to display  EKG None  Radiology No results found.  Procedures Procedures (including critical care time)  Medications Ordered in UC Medications - No data to display  Initial Impression / Assessment and Plan / UC Course  I have reviewed the triage vital signs and the nursing notes.  Pertinent labs & imaging results that were available during my care of the patient were reviewed by me and considered in my medical decision making (see chart for details).     MDM  Xray shows osteoarthritis and a L2 compression fracture.  Pt counseled on results.   Pt will take tylenol for pain.   Pt has appointment next Monday with Dr. Larose Kells.      Final Clinical Impressions(s) / UC Diagnoses   Final diagnoses:  Acute right-sided low back pain without sciatica  Closed compression fracture of second lumbar vertebra, initial encounter [facility])   Discharge Instructions   None    ED Prescriptions    None     Controlled Substance Prescriptions [place] Controlled Substance Registry consulted? Not Applicable   Fransico Meadow, [place] 03/03/18 1358

## 2018-03-03 NOTE — Discharge Instructions (Signed)
Continue tylenol  See Dr. Larose Kells for recheck  You have a compression fracture which is probably not new.

## 2018-03-03 NOTE — ED Triage Notes (Signed)
Patient reports onset of pain in low back right lateral/buttocks that radiates down leg about 3 days ago; no known injury or movement. Did have sciatica years ago. No urinary symptoms.

## 2018-03-03 NOTE — Telephone Encounter (Signed)
Appointment has been made for 03/06/18

## 2018-03-05 ENCOUNTER — Telehealth: Payer: Self-pay | Admitting: Emergency Medicine

## 2018-03-05 NOTE — Telephone Encounter (Signed)
Message left on voice mail inquiring about patient's status and encouraging patient to call with questions/concerns.  

## 2018-03-06 ENCOUNTER — Ambulatory Visit (INDEPENDENT_AMBULATORY_CARE_PROVIDER_SITE_OTHER): Payer: Medicare Other | Admitting: Internal Medicine

## 2018-03-06 ENCOUNTER — Telehealth: Payer: Self-pay

## 2018-03-06 ENCOUNTER — Encounter: Payer: Self-pay | Admitting: Internal Medicine

## 2018-03-06 VITALS — BP 118/72 | HR 74 | Temp 98.2°F | Resp 16 | Ht 60.0 in | Wt 153.4 lb

## 2018-03-06 DIAGNOSIS — M545 Low back pain: Secondary | ICD-10-CM

## 2018-03-06 DIAGNOSIS — M818 Other osteoporosis without current pathological fracture: Secondary | ICD-10-CM

## 2018-03-06 DIAGNOSIS — H10023 Other mucopurulent conjunctivitis, bilateral: Secondary | ICD-10-CM

## 2018-03-06 NOTE — Telephone Encounter (Signed)
Insurance verification submitted.

## 2018-03-06 NOTE — Progress Notes (Signed)
Pre visit review using our clinic review tool, if applicable. No additional management support is needed unless otherwise documented below in the visit note. 

## 2018-03-06 NOTE — Progress Notes (Signed)
Subjective:    Patient ID: Kimberly Rodriguez, female    DOB: 25-Mar-1927, 84 y.o.   MRN: [id number]  DOS:  03/06/2018 Type of visit - description : UC f/u Interval history: Developed suddenly right-sided low back pain without radiation several days ago. Went to urgent care, x-rays were done. The pain is mostly when she gets in and out of bed.  Described as sharp. Overall , pain is about the same or better today. X-rays show a L2 fracture, probably chronic. She has a history of osteoporosis.  Chart reviewed  She also was told she had conjunctivitis, prescribed topical antibiotics, right eye is much better, developed redness in the left, she self started antibiotics there and is improving.  Review of Systems No recent falls No fever chills. No weight loss. Denies lower extremity paresthesias.   Past Medical History:  Diagnosis Date  . Anemia   . Blood transfusion without reported diagnosis   . Cataract   . Eustachian tube dysfunction    Right side, Dr. Laurance Flatten, ENT  . Microhematuria    s/p eval by urology 2008: U/S, CT, cysto w/ neg Bx (dx w/ a renal cyst, see reports)  . Osteoarthritis    h/o back pain, sees ortho s/p shots  . Osteoporosis     Past Surgical History:  Procedure Laterality Date  . ABDOMINAL HYSTERECTOMY     no oophorectomy per pt  . APPENDECTOMY    . COLONOSCOPY    . DILATION AND CURETTAGE OF UTERUS    . TONSILLECTOMY      Social History   Socioeconomic History  . Marital status: Widowed    Spouse name: Not on file  . Number of children: 0  . Years of education: Not on file  . Highest education level: Not on file  Occupational History  . Occupation: retired  Scientific laboratory technician  . Financial resource strain: Not on file  . Food insecurity:    Worry: Not on file    Inability: Not on file  . Transportation needs:    Medical: Not on file    Non-medical: Not on file  Tobacco Use  . Smoking status: Former Research scientist (life sciences)  . Smokeless tobacco: Never Used    Substance and Sexual Activity  . Alcohol use: Yes    Comment: socially -wine  . Drug use: No  . Sexual activity: Not on file  Lifestyle  . Physical activity:    Days per week: Not on file    Minutes per session: Not on file  . Stress: Not on file  Relationships  . Social connections:    Talks on phone: Not on file    Gets together: Not on file    Attends religious service: Not on file    Active member of club or organization: Not on file    Attends meetings of clubs or organizations: Not on file    Relationship status: Not on file  . Intimate partner violence:    Fear of current or ex partner: Not on file    Emotionally abused: Not on file    Physically abused: Not on file    Forced sexual activity: Not on file  Other Topics Concern  . Not on file  Social History Narrative   Household -- lives by herself in a townhouse    Emergency contact-- [name] (sister in law) lives in [place] --[phone]   She is her health POA      Allergies as of 03/06/2018  Reactions   Astelin [azelastine Hcl] Palpitations      Medication List        Accurate as of 03/06/18 11:59 PM. Always use your most recent med list.          CALTRATE 600 PO Take by mouth.   CENTRUM SILVER Chew Chew by mouth.   denosumab 60 MG/ML Soln injection Commonly known as:  PROLIA Inject 60 mg into the skin every 6 (six) months. Administer in upper arm, thigh, or abdomen   FIBER PO Take by mouth.   fluticasone 50 MCG/ACT nasal spray Commonly known as:  FLONASE Place 2 sprays into both nostrils daily as needed.   pantoprazole 20 MG tablet Commonly known as:  PROTONIX Take 1 tablet (20 mg total) by mouth daily.   STOOL SOFTENER PO Take by mouth.          Objective:   Physical Exam BP 118/72 (BP Location: Left Arm, Patient Position: Sitting, Cuff Size: Small)   Pulse 74   Temp 98.2 F (36.8 C) (Oral)   Resp 16   Ht 5' (1.524 m)   Wt 153 lb 6 oz (69.6 kg)   SpO2 96%   BMI 29.95 kg/m   General:   Well developed, NAD, see BMI.  HEENT:  Normocephalic . Face symmetric, atraumatic. Eyes, conjunctivae: Currently with no redness or discharge Skin: Not pale. Not jaundice Neurologic:  alert & oriented X3.  Speech normal, gait appropriate for age and unassisted.  Straight leg test negative. MSK: Hip rotation normal. Mildly TTP on the low back, mostly on the right. Psych--  Cognition and judgment appear intact.  Cooperative with normal attention span and concentration.  Behavior appropriate. No anxious or depressed appearing.      Assessment & Plan:    Assessment interstitial lung dz per chest x-ray 08-2014, PFTs 08-2014: minimal obstruction MSK: --DJD --Neck pain --Back pain: h/o acupuncture before Osteoporosis: T score 04-2015 -3.1, prolia #2  11-2015  Microhematuria  s/p eval by urology 2008: U/S, CT, cysto w/ neg Bx (dx w/ a renal cyst, see reports) ET dysfuntion saw ENT Dr Laurance Flatten 2016 Anemia, saw GI: 06-2016: Cscope: polyps, tubular adenoma; EGD: cameron lesions, no Bx H/o Anxiety   PLAN Low back pain: With no red flag symptoms, x-ray at the urgent care a few days ago show possibly a old L2 fracture, doubt issues are related.  Most likely pain is due to DJD, explained the patient the benefits of PT, she agreed to be referred.  Also recommend a heating pad and Tylenol.  Call if not better Osteoporosis: Was referred to endocrinology, work-up for secondary etiologies was negative, she was recommended Prolia but that was never restarted. Plan: Restart Prolia. Conjunctivitis: Exam is essentially negative, right side resolved, left side was red few days ago is better.  Continue with topical antibiotics for few more days. RTC CPX 04-2018  Today, I spent more than  25  min with the patient: >50% of the time counseling regards the risk of osteoporosis including fractures major problems; also reviewing the chart x-rays ordered by different providers.

## 2018-03-07 NOTE — Assessment & Plan Note (Signed)
Low back pain: With no red flag symptoms, x-ray at the urgent care a few days ago show possibly a old L2 fracture, doubt issues are related.  Most likely pain is due to DJD, explained the patient the benefits of PT, she agreed to be referred.  Also recommend a heating pad and Tylenol.  Call if not better Osteoporosis: Was referred to endocrinology, work-up for secondary etiologies was negative, she was recommended Prolia but that was never restarted. Plan: Restart Prolia. Conjunctivitis: Exam is essentially negative, right side resolved, left side was red few days ago is better.  Continue with topical antibiotics for few more days. RTC CPX 04-2018

## 2018-03-08 ENCOUNTER — Ambulatory Visit: Payer: Medicare Other | Attending: Internal Medicine | Admitting: Physical Therapy

## 2018-03-08 ENCOUNTER — Other Ambulatory Visit: Payer: Self-pay

## 2018-03-08 ENCOUNTER — Encounter: Payer: Self-pay | Admitting: Physical Therapy

## 2018-03-08 DIAGNOSIS — R29898 Other symptoms and signs involving the musculoskeletal system: Secondary | ICD-10-CM | POA: Diagnosis not present

## 2018-03-08 DIAGNOSIS — M6281 Muscle weakness (generalized): Secondary | ICD-10-CM | POA: Diagnosis not present

## 2018-03-08 DIAGNOSIS — G8929 Other chronic pain: Secondary | ICD-10-CM | POA: Insufficient documentation

## 2018-03-08 DIAGNOSIS — M545 Low back pain: Secondary | ICD-10-CM | POA: Insufficient documentation

## 2018-03-08 NOTE — Therapy (Signed)
[facility] [address]  [place], [place], [address] Phone: [phone]   Fax:  [phone]  Physical Therapy Evaluation  Patient Details  Name: Kimberly Rodriguez MRN: [id number] Date of Birth: 14-Oct-1926 Referring Provider: Kathlene November, MD   Encounter Date: 03/08/2018  PT End of Session - 03/08/18 1619    Visit Number  1    Number of Visits  7    Date for PT Re-Evaluation  04/19/18    Authorization Type  [employer] Medicare    PT Start Time  1530    PT Stop Time  1612    PT Time Calculation (min)  42 min    Activity Tolerance  Patient tolerated treatment well    Behavior During Therapy  [facility] for tasks assessed/performed       Past Medical History:  Diagnosis Date  . Anemia   . Blood transfusion without reported diagnosis   . Cataract   . Eustachian tube dysfunction    Right side, Dr. Laurance Flatten, ENT  . Microhematuria    s/p eval by urology 2008: U/S, CT, cysto w/ neg Bx (dx w/ a renal cyst, see reports)  . Osteoarthritis    h/o back pain, sees ortho s/p shots  . Osteoporosis     Past Surgical History:  Procedure Laterality Date  . ABDOMINAL HYSTERECTOMY     no oophorectomy per pt  . APPENDECTOMY    . COLONOSCOPY    . DILATION AND CURETTAGE OF UTERUS    . TONSILLECTOMY      There were no vitals filed for this visit.   Subjective Assessment - 03/08/18 1532    Subjective  Patient reports LBP for about 10 years with recent flare up of 10 days duration. Can't recall any event that could have caused this. Symptoms began on R LB and today it had moved to the L side. Denies N/T. Aggravating factors include: getting in and out of the car, getting in and out of bed. Also having L sided neck pain with fluctuating symptoms- worse with extension. 2 years ago she went tubing because she had neck pain after that.    Pertinent History  anemia, R eustachian tube dysfunction, OA, osteoporosis    How long can you sit comfortably?   unlimited    How long can you stand comfortably?  1 hour limited by fatigue    How long can you walk comfortably?  30 minutes     Diagnostic tests  03/03/18 lumbar xray: No acute appearing osseous abnormality; Mild compression deformity of the superior endplate of the L2 vertebral body, of uncertain age but most likely chronic, mild-mod spondylitic changes in thoracolumbar spine    Patient Stated Goals  improved tolerance of activities    Currently in Pain?  No/denies    Pain Location  Back    Pain Orientation  Left;Right    Pain Descriptors / Indicators  Dull    Pain Type  Chronic pain    Aggravating Factors   getting in and out of the car    Pain Relieving Factors  heat         [facility] PT Assessment - 03/08/18 1551      Assessment   Medical Diagnosis  LBP    Referring Provider  Kathlene November, MD    Onset Date/Surgical Date  02/26/18    Next MD Visit  05/10/18    Prior Therapy  yes      Precautions  Precautions  --   osteoporosis     Restrictions   Weight Bearing Restrictions  No      Balance Screen   Has the patient fallen in the past 6 months  No    Has the patient had a decrease in activity level because of a fear of falling?   Yes    Is the patient reluctant to leave their home because of a fear of falling?   No      Home Social worker  Private residence    Living Arrangements  Alone    Available Help at Discharge  Family    Type of Home  --   townhome   Home Access  Level entry    Home Layout  One level    [place] - single point;Crutches;Walker - 2 wheels      Prior Function   Level of Independence  Independent      Cognition   Overall Cognitive Status  Within Functional Limits for tasks assessed      Observation/Other Assessments   Focus on Therapeutic Outcomes (FOTO)   Lumbar: 60 (40% limited, 27% predicted)      Sensation   Light Touch  Appears Intact      Coordination   Gross Motor Movements are Fluid and Coordinated  Yes       Posture/Postural Control   Posture/Postural Control  Postural limitations    Postural Limitations  Rounded Shoulders;Forward head;Increased thoracic kyphosis      ROM / Strength   AROM / PROM / Strength  AROM;Strength      AROM   AROM Assessment Site  Lumbar    Lumbar Flexion  NT- osteoporosis    Lumbar Extension  severely limited    Lumbar - Right Side Bend  jt line   R LB discomfort   Lumbar - Left Side Bend  jt line    Lumbar - Right Rotation  WFL    Lumbar - Left Rotation  [facility]      Strength   Strength Assessment Site  Hip;Knee;Ankle    Right/Left Hip  Right;Left    Right Hip Flexion  4/5    Right Hip ABduction  4/5    Right Hip ADduction  4-/5    Left Hip Flexion  4/5   L LBP   Left Hip ABduction  4/5    Left Hip ADduction  4-/5    Right/Left Knee  Right;Left    Right Knee Flexion  4-/5    Right Knee Extension  4+/5    Left Knee Flexion  4-/5    Left Knee Extension  4+/5    Right/Left Ankle  Right;Left    Right Ankle Dorsiflexion  4+/5    Right Ankle Plantar Flexion  4+/5    Left Ankle Dorsiflexion  4/5    Left Ankle Plantar Flexion  4/5      Flexibility   Soft Tissue Assessment /Muscle Length  yes   B hip flexors moderately tight   Hamstrings  B severely tight      Palpation   Palpation comment  no tenderness with palpation of B parapsinals, piriformis, glutes      Ambulation/Gait   Gait Pattern  Step-through pattern;Decreased step length - left;Decreased step length - right;Trunk flexed   decreased gait speed               Objective measurements completed on examination: See above findings.  PT Education - 03/08/18 1619    Education Details  prognosis, POC, HEP    Person(s) Educated  Patient    Methods  Explanation;Demonstration;Tactile cues;Verbal cues;Handout    Comprehension  Verbalized understanding;Returned demonstration       PT Short Term Goals - 03/08/18 1629      PT SHORT TERM GOAL #1   Title   Patient to be independent with initial HEP.    Time  3    Period  Weeks    Status  New    Target Date  03/29/18        PT Long Term Goals - 03/08/18 1629      PT LONG TERM GOAL #1   Title  Patient to be independent with advanced HEP.    Time  6    Period  Weeks    Status  New    Target Date  04/19/18      PT LONG TERM GOAL #2   Title  Patient to demonstrate >=4+/5 strength in B LEs.    Time  6    Period  Weeks    Status  New    Target Date  04/19/18      PT LONG TERM GOAL #3   Title  Patient to report tolerance of supine>sit without pain limiting.     Time  6    Period  Weeks    Status  New    Target Date  04/19/18      PT LONG TERM GOAL #4   Title  Patient to demonstrate mild tightness in B HS and hip flexors.     Time  6    Period  Weeks    Status  New    Target Date  04/19/18             Plan - 03/08/18 1627    Clinical Impression Statement  Patient is an 84y/o F presenting to OPPT with c/o acute on chronic LBP of 10 days duration. Patient reports pain started in R LBP/buttock and today moved to L side. Aggravating factors include getting in/out of car and in/out of bed. Recent xray shows likely chronic L2 compression fx. Patient also mentions chronic L sided neck pain- worse with extension. Patient today with decreased B LE strength, limited and mildly painful lumbar ROM, decreased HS and hip flexor flexibility, and forward head/thoracic kyphotic posturing. No tenderness to palpation. Patient educated on and received gentle stretching and strengthening HEP. Reported understanding. Would benefit from skilled PT services 1x/week for 6 weeks to address aforementioned impairments.     Clinical Presentation  Stable    Clinical Decision Making  Low    Rehab Potential  Good    PT Frequency  1x / week    PT Duration  6 weeks    PT Treatment/Interventions  ADLs/Self Care Home Management;Cryotherapy;Electrical Stimulation;Ultrasound;Gait training;Stair  training;Functional mobility training;Therapeutic activities;Therapeutic exercise;Manual techniques;Patient/family education;Neuromuscular re-education;Balance training;Passive range of motion;Dry needling;Energy conservation;Splinting;Taping    PT Next Visit Plan  reassess HEP    Consulted and Agree with Plan of Care  Patient       Patient will benefit from skilled therapeutic intervention in order to improve the following deficits and impairments:  Decreased endurance, Pain, Decreased strength, Decreased activity tolerance, Decreased balance, Difficulty walking, Decreased mobility, Improper body mechanics, Postural dysfunction, Impaired flexibility  Visit Diagnosis: Chronic bilateral low back pain without sciatica  Muscle weakness (generalized)  Other symptoms and signs involving the musculoskeletal system  Problem List Patient Active Problem List   Diagnosis Date Noted  . PCP NOTES >>>>>>>>>>>>>>>>>>>>> 04/01/2015  . Lung disease--diffuse interstitial lung disease by x-ray 08-2014 09/05/2014  . Anxiety 02/29/2012  . Annual physical exam 12/11/2010  . DJD (degenerative joint disease) 10/24/2009  . Osteoporosis 06/05/2009     Janene Harvey, PT, DPT 03/08/18 4:35 PM   [facility] [address]  [place], [place], [address] Phone: [phone]   Fax:  [phone]  Name: CHEZNEY HUETHER MRN: [id number] Date of Birth: [birth year]

## 2018-03-15 ENCOUNTER — Ambulatory Visit: Payer: Medicare Other

## 2018-03-15 DIAGNOSIS — G8929 Other chronic pain: Secondary | ICD-10-CM

## 2018-03-15 DIAGNOSIS — M545 Low back pain, unspecified: Secondary | ICD-10-CM

## 2018-03-15 DIAGNOSIS — R29898 Other symptoms and signs involving the musculoskeletal system: Secondary | ICD-10-CM

## 2018-03-15 DIAGNOSIS — M6281 Muscle weakness (generalized): Secondary | ICD-10-CM | POA: Diagnosis not present

## 2018-03-15 NOTE — Patient Instructions (Signed)

## 2018-03-15 NOTE — Therapy (Signed)
[facility] [address]  [place], [place], [address] Phone: [phone]   Fax:  [phone]  Physical Therapy Treatment  Patient Details  Name: Kimberly Rodriguez MRN: [id number] Date of Birth: May 03, 1927 Referring Provider: Kathlene November, MD   Encounter Date: 03/15/2018  PT End of Session - 03/15/18 1451    Visit Number  2    Number of Visits  7    Date for PT Re-Evaluation  04/19/18    Authorization Type  [employer]HC Medicare    PT Start Time  1446    PT Stop Time  1528    PT Time Calculation (min)  42 min    Activity Tolerance  Patient tolerated treatment well    Behavior During Therapy  [facility] for tasks assessed/performed       Past Medical History:  Diagnosis Date  . Anemia   . Blood transfusion without reported diagnosis   . Cataract   . Eustachian tube dysfunction    Right side, Dr. Laurance Flatten, ENT  . Microhematuria    s/p eval by urology 2008: U/S, CT, cysto w/ neg Bx (dx w/ a renal cyst, see reports)  . Osteoarthritis    h/o back pain, sees ortho s/p shots  . Osteoporosis     Past Surgical History:  Procedure Laterality Date  . ABDOMINAL HYSTERECTOMY     no oophorectomy per pt  . APPENDECTOMY    . COLONOSCOPY    . DILATION AND CURETTAGE OF UTERUS    . TONSILLECTOMY      There were no vitals filed for this visit.  Subjective Assessment - 03/15/18 1451    Subjective  Pt. doing well today however notes, "I'll be honest, I haven't followed through very well with my exercises".      Pertinent History  anemia, R eustachian tube dysfunction, OA, osteoporosis    Diagnostic tests  03/03/18 lumbar xray: No acute appearing osseous abnormality; Mild compression deformity of the superior endplate of the L2 vertebral body, of uncertain age but most likely chronic, mild-mod spondylitic changes in thoracolumbar spine    Patient Stated Goals  improved tolerance of activities    Currently in Pain?  No/denies    Pain Score   0-No pain    Multiple Pain Sites  No                       [facility] Adult PT Treatment/Exercise - 03/15/18 1504      Self-Care   Self-Care  Other Self-Care Comments    Other Self-Care Comments   instruction in proper posture and body mechanics with daily tasks as to reduce lumbar strain       Lumbar Exercises: Stretches   Passive Hamstring Stretch  Right;Left;1 rep;30 seconds    Passive Hamstring Stretch Limitations  strap     Hip Flexor Stretch  Right;Left;1 rep;30 seconds    Hip Flexor Stretch Limitations  mod thomas position with strap       Lumbar Exercises: Aerobic   Nustep  Lvl 3, 7 min    UE/LE     Lumbar Exercises: Supine   Bridge  --   x 12 reps    Bridge Limitations  cues to prevent pt. holding breath during concentric of set     Bridge with [employer]  10 reps;3 seconds    Bridge with [employer] Limitations  adduction ball squeeze  PT Education - 03/15/18 1638    Education Details  HEP update with posture and body mechanics handout     Person(s) Educated  Patient    Methods  Explanation;Demonstration;Verbal cues;Handout    Comprehension  Verbalized understanding;Returned demonstration;Verbal cues required;Need further instruction       PT Short Term Goals - 03/15/18 1452      PT SHORT TERM GOAL #1   Title  Patient to be independent with initial HEP.    Time  3    Period  Weeks    Status  On-going        PT Long Term Goals - 03/15/18 1452      PT LONG TERM GOAL #1   Title  Patient to be independent with advanced HEP.    Time  6    Period  Weeks    Status  On-going      PT LONG TERM GOAL #2   Title  Patient to demonstrate >=4+/5 strength in B LEs.    Time  6    Period  Weeks    Status  On-going      PT LONG TERM GOAL #3   Title  Patient to report tolerance of supine>sit without pain limiting.     Time  6    Period  Weeks    Status  On-going      PT LONG TERM GOAL #4   Title  Patient to demonstrate mild  tightness in B HS and hip flexors.     Time  6    Period  Weeks    Status  On-going            Plan - 03/15/18 1454    Clinical Impression Statement  Kimberly Rodriguez noting mild improvement in LBP however reporting limited compliance with HEP.  Encouraged pt. for full compliance of HEP in order to get full benefit from therapy.  Tolerated progression of lumbopelvic strengthening activities today well and ended visit pain free.  Ended of session focused on instruction in proper posture and body mechanics with daily tasks as to reduce lumbar strain.      PT Treatment/Interventions  ADLs/Self Care Home Management;Cryotherapy;Electrical Stimulation;Ultrasound;Gait training;Stair training;Functional mobility training;Therapeutic activities;Therapeutic exercise;Manual techniques;Patient/family education;Neuromuscular re-education;Balance training;Passive range of motion;Dry needling;Energy conservation;Splinting;Taping    Consulted and Agree with Plan of Care  Patient       Patient will benefit from skilled therapeutic intervention in order to improve the following deficits and impairments:  Decreased endurance, Pain, Decreased strength, Decreased activity tolerance, Decreased balance, Difficulty walking, Decreased mobility, Improper body mechanics, Postural dysfunction, Impaired flexibility  Visit Diagnosis: Chronic bilateral low back pain without sciatica  Muscle weakness (generalized)  Other symptoms and signs involving the musculoskeletal system     Problem List Patient Active Problem List   Diagnosis Date Noted  . PCP NOTES >>>>>>>>>>>>>>>>>>>>> 04/01/2015  . Lung disease--diffuse interstitial lung disease by x-ray 08-2014 09/05/2014  . Anxiety 02/29/2012  . Annual physical exam 12/11/2010  . DJD (degenerative joint disease) 10/24/2009  . Osteoporosis 06/05/2009    Bess Harvest, PTA 03/15/18 4:39 PM   [facility] [address]  [place], [place], [address] Phone: [phone]   Fax:  [phone]  Name: Kimberly Rodriguez MRN: [id number] Date of Birth: [birth year]

## 2018-03-22 ENCOUNTER — Ambulatory Visit: Payer: Medicare Other | Attending: Internal Medicine

## 2018-03-22 DIAGNOSIS — G8929 Other chronic pain: Secondary | ICD-10-CM | POA: Diagnosis not present

## 2018-03-22 DIAGNOSIS — M545 Low back pain, unspecified: Secondary | ICD-10-CM

## 2018-03-22 DIAGNOSIS — R29898 Other symptoms and signs involving the musculoskeletal system: Secondary | ICD-10-CM | POA: Diagnosis not present

## 2018-03-22 DIAGNOSIS — M6281 Muscle weakness (generalized): Secondary | ICD-10-CM | POA: Diagnosis not present

## 2018-03-22 NOTE — Therapy (Signed)
[facility] [address]  [place], [place], [address] Phone: [phone]   Fax:  [phone]  Physical Therapy Treatment  Patient Details  Name: Kimberly Rodriguez MRN: [id number] Date of Birth: [birth year] Referring Provider (PT): Kathlene November, MD   Encounter Date: 03/22/2018  PT End of Session - 03/22/18 1438    Visit Number  3    Number of Visits  7    Date for PT Re-Evaluation  04/19/18    Authorization Type  [employer] Medicare    PT Start Time  1442    PT Stop Time  1525    PT Time Calculation (min)  43 min    Activity Tolerance  Patient tolerated treatment well    Behavior During Therapy  [facility] for tasks assessed/performed       Past Medical History:  Diagnosis Date  . Anemia   . Blood transfusion without reported diagnosis   . Cataract   . Eustachian tube dysfunction    Right side, Dr. Laurance Flatten, ENT  . Microhematuria    s/p eval by urology 2008: U/S, CT, cysto w/ neg Bx (dx w/ a renal cyst, see reports)  . Osteoarthritis    h/o back pain, sees ortho s/p shots  . Osteoporosis     Past Surgical History:  Procedure Laterality Date  . ABDOMINAL HYSTERECTOMY     no oophorectomy per pt  . APPENDECTOMY    . COLONOSCOPY    . DILATION AND CURETTAGE OF UTERUS    . TONSILLECTOMY      There were no vitals filed for this visit.  Subjective Assessment - 03/22/18 1438    Subjective  Pt. noting occasional neck pain while turning head which she states is not new for her.      Pertinent History  anemia, R eustachian tube dysfunction, OA, osteoporosis    Diagnostic tests  03/03/18 lumbar xray: No acute appearing osseous abnormality; Mild compression deformity of the superior endplate of the L2 vertebral body, of uncertain age but most likely chronic, mild-mod spondylitic changes in thoracolumbar spine    Patient Stated Goals  improved tolerance of activities    Currently in Pain?  No/denies    Pain Score  0-No pain     Multiple Pain Sites  Yes    Pain Score  4    Pain Location  Neck    Pain Orientation  Left;Posterior    Pain Descriptors / Indicators  Dull;Aching    Pain Type  Acute pain    Pain Frequency  Intermittent    Aggravating Factors   rotating head     Pain Relieving Factors  turning head back to neutral                        [facility] Adult PT Treatment/Exercise - 03/22/18 1455      Lumbar Exercises: Stretches   Passive Hamstring Stretch  Right;Left;1 rep;30 seconds    Passive Hamstring Stretch Limitations  strap       Lumbar Exercises: Aerobic   Nustep  Lvl 3, 7 min       Lumbar Exercises: Seated   Hip Flexion on Ball  Right;Left;10 reps    Hip Flexion on Ball Limitations  seated on green p-ball    2 sets; 1st set UE raise; 2nd set LE raise    Sit to Stand  10 reps    Sit to Stand Limitations  Cues to  drive knees outward into red looped band   without UE pushoff    Other Seated Lumbar Exercises  Alternating clam shell with red TB at knees x 10 reps     Other Seated Lumbar Exercises  Alternating fitter leg press (1 black band) x 15 reps each LE      Lumbar Exercises: Supine   Clam  10 reps;3 seconds    Clam Limitations  yellow TB at knees    Bridge with Ball Squeeze  3 seconds   x 12 reps    Bridge with Ball Squeeze Limitations  adduction ball squeeze                PT Short Term Goals - 03/15/18 1452      PT SHORT TERM GOAL #1   Title  Patient to be independent with initial HEP.    Time  3    Period  Weeks    Status  On-going        PT Long Term Goals - 03/15/18 1452      PT LONG TERM GOAL #1   Title  Patient to be independent with advanced HEP.    Time  6    Period  Weeks    Status  On-going      PT LONG TERM GOAL #2   Title  Patient to demonstrate >=4+/5 strength in B LEs.    Time  6    Period  Weeks    Status  On-going      PT LONG TERM GOAL #3   Title  Patient to report tolerance of supine>sit without pain limiting.     Time  6     Period  Weeks    Status  On-going      PT LONG TERM GOAL #4   Title  Patient to demonstrate mild tightness in B HS and hip flexors.     Time  6    Period  Weeks    Status  On-going            Plan - 03/22/18 1439    Clinical Impression Statement  Kimberly Rodriguez Primary complaint today was neck pain to start session, which subsided as visit progressed.  Session focusing on gentle lumbopelvic strengthening activities with progression to fitter leg press, and seated march on green pball.  Cues provided throughout therex for slow pacing.  Reports some improvement in pain levels since starting therapy.  Progressing well toward goals.  Will plan to update HEP per pt. response to today's visit in upcoming session.      PT Treatment/Interventions  ADLs/Self Care Home Management;Cryotherapy;Electrical Stimulation;Ultrasound;Gait training;Stair training;Functional mobility training;Therapeutic activities;Therapeutic exercise;Manual techniques;Patient/family education;Neuromuscular re-education;Balance training;Passive range of motion;Dry needling;Energy conservation;Splinting;Taping    Consulted and Agree with Plan of Care  Patient       Patient will benefit from skilled therapeutic intervention in order to improve the following deficits and impairments:  Decreased endurance, Pain, Decreased strength, Decreased activity tolerance, Decreased balance, Difficulty walking, Decreased mobility, Improper body mechanics, Postural dysfunction, Impaired flexibility  Visit Diagnosis: Chronic bilateral low back pain without sciatica  Muscle weakness (generalized)  Other symptoms and signs involving the musculoskeletal system     Problem List Patient Active Problem List   Diagnosis Date Noted  . PCP NOTES >>>>>>>>>>>>>>>>>>>>> 04/01/2015  . Lung disease--diffuse interstitial lung disease by x-ray 08-2014 09/05/2014  . Anxiety 02/29/2012  . Annual physical exam 12/11/2010  . DJD (degenerative joint  disease) 10/24/2009  . Osteoporosis  06/05/2009    Bess Harvest, PTA 03/22/18 5:01 PM   [facility] [address]  [place], [place], [address] Phone: [phone]   Fax:  [phone]  Name: Kimberly Rodriguez MRN: [id number] Date of Birth: Sep 23, 1926

## 2018-03-27 NOTE — Telephone Encounter (Signed)
Can you order this for Pt?

## 2018-03-27 NOTE — Telephone Encounter (Signed)
Yep, dose coming this week, OK to label her name on one of the packages.

## 2018-03-27 NOTE — Telephone Encounter (Signed)
Prolia benefits received PA not required  Patient may owe approximately $240.00 OOP  Patient due now, last received per our records 12/21/2016.  Author phoned pt. To notify and schedule, but no answer. Author left VM asking for return call. OK for [facility] to relay message above, and schedule pt. For a nurse visit. CRM created.

## 2018-03-27 NOTE — Telephone Encounter (Signed)
Prolia benefits received PA not required  Patient may owe approximately $240.00 OOP  Patient due now.  Author phoned pt. To notify and schedule,.

## 2018-03-28 NOTE — Telephone Encounter (Signed)
Pt. Called, and made aware via PEC that prolia is available. Kaylyn made aware to set aside spare stock for pt.; PEC making NV appointment.

## 2018-03-29 ENCOUNTER — Ambulatory Visit: Payer: Medicare Other | Admitting: Physical Therapy

## 2018-03-29 ENCOUNTER — Encounter: Payer: Self-pay | Admitting: Physical Therapy

## 2018-03-29 DIAGNOSIS — G8929 Other chronic pain: Secondary | ICD-10-CM | POA: Diagnosis not present

## 2018-03-29 DIAGNOSIS — M545 Low back pain, unspecified: Secondary | ICD-10-CM

## 2018-03-29 DIAGNOSIS — R29898 Other symptoms and signs involving the musculoskeletal system: Secondary | ICD-10-CM | POA: Diagnosis not present

## 2018-03-29 DIAGNOSIS — M6281 Muscle weakness (generalized): Secondary | ICD-10-CM

## 2018-03-29 NOTE — Telephone Encounter (Signed)
Prolia in fridge

## 2018-03-29 NOTE — Therapy (Signed)
[facility] [address]  [place], [place], [address] Phone: [phone]   Fax:  [phone]  Physical Therapy Treatment  Patient Details  Name: Kimberly Rodriguez MRN: [id number] Date of Birth: 1927-02-13 Referring Provider (PT): Kathlene November, MD   Encounter Date: 03/29/2018  PT End of Session - 03/29/18 1527    Visit Number  4    Number of Visits  7    Date for PT Re-Evaluation  04/19/18    Authorization Type  [employer]HC Medicare    PT Start Time  1442    PT Stop Time  1523    PT Time Calculation (min)  41 min    Activity Tolerance  Patient tolerated treatment well    Behavior During Therapy  [facility] for tasks assessed/performed       Past Medical History:  Diagnosis Date  . Anemia   . Blood transfusion without reported diagnosis   . Cataract   . Eustachian tube dysfunction    Right side, Dr. Laurance Flatten, ENT  . Microhematuria    s/p eval by urology 2008: U/S, CT, cysto w/ neg Bx (dx w/ a renal cyst, see reports)  . Osteoarthritis    h/o back pain, sees ortho s/p shots  . Osteoporosis     Past Surgical History:  Procedure Laterality Date  . ABDOMINAL HYSTERECTOMY     no oophorectomy per pt  . APPENDECTOMY    . COLONOSCOPY    . DILATION AND CURETTAGE OF UTERUS    . TONSILLECTOMY      There were no vitals filed for this visit.  Subjective Assessment - 03/29/18 1443    Subjective  Patient reports she is pretty good. "Exercises are not perfect, but they are going." Yesterday had some L sided neck pain but is not bothering her today.    Pertinent History  anemia, R eustachian tube dysfunction, OA, osteoporosis    Diagnostic tests  03/03/18 lumbar xray: No acute appearing osseous abnormality; Mild compression deformity of the superior endplate of the L2 vertebral body, of uncertain age but most likely chronic, mild-mod spondylitic changes in thoracolumbar spine    Patient Stated Goals  improved tolerance of activities    Currently in Pain?  No/denies                       [facility] Adult PT Treatment/Exercise - 03/29/18 0001      Exercises   Exercises  Knee/Hip      Lumbar Exercises: Stretches   Passive Hamstring Stretch  Right;Left;1 rep;30 seconds    Passive Hamstring Stretch Limitations  LE propped on stool      Lumbar Exercises: Aerobic   Nustep  Lvl 3, 6 min       Lumbar Exercises: Seated   Hip Flexion on Ball  Right;Left;20 reps    Hip Flexion on Ball Limitations  seated on green p-ball LAQ   with cues for ant pelvic tilt   Other Seated Lumbar Exercises  B scapular row with red TB 2x10    Other Seated Lumbar Exercises  scapular retraction 10x3"   cues to avoid shoudler extension     Knee/Hip Exercises: Seated   Clamshell with TheraBand  Red   2x15   Other Seated Knee/Hip Exercises  fitter knee extension 2 blue bands 10x each LE   cues to avoid valsalva   Other Seated Knee/Hip Exercises  sitting on green pball alt UE/LE raise with  CGA   cues to reduce speed and ant pelvic tilt   Sit to Sand  2 sets;5 reps;without UE support   red TB around knees 2nd set            PT Education - 03/29/18 1527    Education Details  update to HEP; administered TB, info on stretching strap    Person(s) Educated  Patient    Methods  Explanation;Demonstration;Tactile cues;Verbal cues;Handout    Comprehension  Returned demonstration;Verbalized understanding       PT Short Term Goals - 03/15/18 1452      PT SHORT TERM GOAL #1   Title  Patient to be independent with initial HEP.    Time  3    Period  Weeks    Status  On-going        PT Long Term Goals - 03/15/18 1452      PT LONG TERM GOAL #1   Title  Patient to be independent with advanced HEP.    Time  6    Period  Weeks    Status  On-going      PT LONG TERM GOAL #2   Title  Patient to demonstrate >=4+/5 strength in B LEs.    Time  6    Period  Weeks    Status  On-going      PT LONG TERM GOAL #3   Title  Patient to  report tolerance of supine>sit without pain limiting.     Time  6    Period  Weeks    Status  On-going      PT LONG TERM GOAL #4   Title  Patient to demonstrate mild tightness in B HS and hip flexors.     Time  6    Period  Weeks    Status  On-going            Plan - 03/29/18 1527    Clinical Impression Statement  Patient arrived to session with no new complaints. Reporting some difficulty performing supine HS stretch d/t not having a good stretching strap. Modified HS stretch to sitting position- patient preferring use of stretching strap. Printed out patient information about acquiring strap. Worked on STS with cues for foot placement and glute activation once standing. Use of banded resistance around knees helped encourage correction of valgus positioning. Worked on upright posture with scapular retractions and rows. Patient requiring CGA and cues for anterior pelvic tilt with sitting on physical exercises however no LOB. Updated HEP today- patient reported understanding. No c/o pain at end of session.     PT Treatment/Interventions  ADLs/Self Care Home Management;Cryotherapy;Electrical Stimulation;Ultrasound;Gait training;Stair training;Functional mobility training;Therapeutic activities;Therapeutic exercise;Manual techniques;Patient/family education;Neuromuscular re-education;Balance training;Passive range of motion;Dry needling;Energy conservation;Splinting;Taping    Consulted and Agree with Plan of Care  Patient       Patient will benefit from skilled therapeutic intervention in order to improve the following deficits and impairments:  Decreased endurance, Pain, Decreased strength, Decreased activity tolerance, Decreased balance, Difficulty walking, Decreased mobility, Improper body mechanics, Postural dysfunction, Impaired flexibility  Visit Diagnosis: Chronic bilateral low back pain without sciatica  Muscle weakness (generalized)  Other symptoms and signs involving the  musculoskeletal system     Problem List Patient Active Problem List   Diagnosis Date Noted  . PCP NOTES >>>>>>>>>>>>>>>>>>>>> 04/01/2015  . Lung disease--diffuse interstitial lung disease by x-ray 08-2014 09/05/2014  . Anxiety 02/29/2012  . Annual physical exam 12/11/2010  . DJD (degenerative joint disease) 10/24/2009  .  Osteoporosis 06/05/2009    Janene Harvey, PT, DPT 03/29/18 3:36 PM   [facility] [address]  [place], [place], [address] Phone: [phone]   Fax:  [phone]  Name: KRISTY SCHOMBURG MRN: [id number] Date of Birth: 06-12-1927

## 2018-04-05 ENCOUNTER — Ambulatory Visit: Payer: Medicare Other

## 2018-04-12 ENCOUNTER — Ambulatory Visit: Payer: Medicare Other | Admitting: Physical Therapy

## 2018-04-12 ENCOUNTER — Ambulatory Visit (INDEPENDENT_AMBULATORY_CARE_PROVIDER_SITE_OTHER): Payer: Medicare Other

## 2018-04-12 ENCOUNTER — Encounter: Payer: Self-pay | Admitting: Physical Therapy

## 2018-04-12 DIAGNOSIS — M6281 Muscle weakness (generalized): Secondary | ICD-10-CM | POA: Diagnosis not present

## 2018-04-12 DIAGNOSIS — R29898 Other symptoms and signs involving the musculoskeletal system: Secondary | ICD-10-CM

## 2018-04-12 DIAGNOSIS — M818 Other osteoporosis without current pathological fracture: Secondary | ICD-10-CM | POA: Diagnosis not present

## 2018-04-12 DIAGNOSIS — M545 Low back pain, unspecified: Secondary | ICD-10-CM

## 2018-04-12 DIAGNOSIS — G8929 Other chronic pain: Secondary | ICD-10-CM | POA: Diagnosis not present

## 2018-04-12 MED ORDER — DENOSUMAB 60 MG/ML ~~LOC~~ SOSY
60.0000 mg | PREFILLED_SYRINGE | Freq: Once | SUBCUTANEOUS | Status: AC
  Administered 2018-04-12: 60 mg via SUBCUTANEOUS

## 2018-04-12 NOTE — Therapy (Deleted)
[facility] [address]  [place], [place], [address] Phone: [phone]   Fax:  [phone]  Physical Therapy Evaluation  Patient Details  Name: Kimberly Rodriguez MRN: [id number] Date of Birth: 1927-01-21 Referring Provider (PT): Kathlene November, MD   Encounter Date: 04/12/2018  PT End of Session - 04/12/18 1529    Visit Number  5    Number of Visits  7    Date for PT Re-Evaluation  04/19/18    Authorization Type  [employer]HC Medicare    PT Start Time  1448    PT Stop Time  1528    PT Time Calculation (min)  40 min    Activity Tolerance  Patient tolerated treatment well    Behavior During Therapy  [facility] for tasks assessed/performed       Past Medical History:  Diagnosis Date  . Anemia   . Blood transfusion without reported diagnosis   . Cataract   . Eustachian tube dysfunction    Right side, Dr. Laurance Flatten, ENT  . Microhematuria    s/p eval by urology 2008: U/S, CT, cysto w/ neg Bx (dx w/ a renal cyst, see reports)  . Osteoarthritis    h/o back pain, sees ortho s/p shots  . Osteoporosis     Past Surgical History:  Procedure Laterality Date  . ABDOMINAL HYSTERECTOMY     no oophorectomy per pt  . APPENDECTOMY    . COLONOSCOPY    . DILATION AND CURETTAGE OF UTERUS    . TONSILLECTOMY      There were no vitals filed for this visit.   Subjective Assessment - 04/12/18 1449    Subjective  Reports she is feeling pretty good. Feels good about wrapping up with PT next time.     Pertinent History  anemia, R eustachian tube dysfunction, OA, osteoporosis    Diagnostic tests  03/03/18 lumbar xray: No acute appearing osseous abnormality; Mild compression deformity of the superior endplate of the L2 vertebral body, of uncertain age but most likely chronic, mild-mod spondylitic changes in thoracolumbar spine    Patient Stated Goals  improved tolerance of activities    Currently in Pain?  No/denies                     Objective measurements completed on examination: See above findings.      [facility] Adult PT Treatment/Exercise - 04/12/18 0001      Exercises   Exercises  Knee/Hip;Lumbar      Lumbar Exercises: Stretches   Other Lumbar Stretch Exercise  doorway 90/90 pec stretch 2x30" each UE      Lumbar Exercises: Aerobic   Nustep  Lvl 4, 6 min       Lumbar Exercises: Machines for Strengthening   Cybex Lumbar Extension  B LEs 10x10#   cues to tighten core   Cybex Knee Extension  B LEs 10x10#    cues for slow eccentric control and tightneing core     Lumbar Exercises: Standing   Heel Raises  10 reps;Limitations    Heel Raises Limitations  B heel/toe raises with hands hovering over counter top   c/o difficulty d/t shoes   Row  Strengthening;Both;10 reps;Theraband;Limitations    Theraband Level (Row)  Level 3 (Green)    Row Limitations  cues to correct anterior trunk lean    Shoulder Extension  Strengthening;Both;10 reps;Theraband;Limitations    Theraband Level (Shoulder Extension)  Level 2 (Red)  Shoulder Extension Limitations  heavy manual cues required for form and upright trunk    Other Standing Lumbar Exercises  B shoulder ER with red TB x10; cues for form    Other Standing Lumbar Exercises  B SLS with hands covering over counter top x 4 min   cues for core and glute activation, upright trunk     Knee/Hip Exercises: Standing   SLS with Vectors  R & L SLS to colored cones with CGA/min A intermittently on L LE   + cognitive challenge by calling out colors            PT Education - 04/12/18 1529    Education Details  update to HEP    Person(s) Educated  Patient    Methods  Explanation;Demonstration;Tactile cues;Verbal cues;Handout    Comprehension  Verbalized understanding;Returned demonstration       PT Short Term Goals - 04/12/18 1530      PT SHORT TERM GOAL #1   Title  Patient to be independent with initial HEP.    Time  3    Period  Weeks     Status  Achieved        PT Long Term Goals - 03/15/18 1452      PT LONG TERM GOAL #1   Title  Patient to be independent with advanced HEP.    Time  6    Period  Weeks    Status  On-going      PT LONG TERM GOAL #2   Title  Patient to demonstrate >=4+/5 strength in B LEs.    Time  6    Period  Weeks    Status  On-going      PT LONG TERM GOAL #3   Title  Patient to report tolerance of supine>sit without pain limiting.     Time  6    Period  Weeks    Status  On-going      PT LONG TERM GOAL #4   Title  Patient to demonstrate mild tightness in B HS and hip flexors.     Time  6    Period  Weeks    Status  On-going             Plan - 04/12/18 1530    Clinical Impression Statement  Patient reporting no new complaints- reports only having LBP when sitting for long periods now. Comfortable with wrapping up with PT at next session. Patient tolerated increase in resistance with scapular row- cues required to correct anterior trunk lean. Patient also requiring cues for upright trunk with shoulder extension. Worked on heel-toe raises and SLS with cues for core and glute activation and upright stance. Patient with most difficulty on L LE. Able to perform SLS to vectors with better performance, however still requiring intermittent CGA/min A on L LE. Provided cues for core contraction with resisted machine LE strengthening with good tolerance by patient. No complaints at end of session.     PT Treatment/Interventions  ADLs/Self Care Home Management;Cryotherapy;Electrical Stimulation;Ultrasound;Gait training;Stair training;Functional mobility training;Therapeutic activities;Therapeutic exercise;Manual techniques;Patient/family education;Neuromuscular re-education;Balance training;Passive range of motion;Dry needling;Energy conservation;Splinting;Taping    Consulted and Agree with Plan of Care  Patient       Patient will benefit from skilled therapeutic intervention in order to improve  the following deficits and impairments:  Decreased endurance, Pain, Decreased strength, Decreased activity tolerance, Decreased balance, Difficulty walking, Decreased mobility, Improper body mechanics, Postural dysfunction, Impaired flexibility  Visit Diagnosis: Chronic bilateral low  back pain without sciatica  Muscle weakness (generalized)  Other symptoms and signs involving the musculoskeletal system     Problem List Patient Active Problem List   Diagnosis Date Noted  . PCP NOTES >>>>>>>>>>>>>>>>>>>>> 04/01/2015  . Lung disease--diffuse interstitial lung disease by x-ray 08-2014 09/05/2014  . Anxiety 02/29/2012  . Annual physical exam 12/11/2010  . DJD (degenerative joint disease) 10/24/2009  . Osteoporosis 06/05/2009    Kimberly Rodriguez 04/12/2018, 3:31 PM  [facility] [address]  [place], [place], [address] Phone: [phone]   Fax:  [phone]  Name: Kimberly Rodriguez MRN: [id number] Date of Birth: 20-Jun-1927

## 2018-04-12 NOTE — Therapy (Signed)
[facility] [address]  [place], [place], [address] Phone: [phone]   Fax:  [phone]  Physical Therapy Treatment  Patient Details  Name: Kimberly Rodriguez MRN: [id number] Date of Birth: 30-Apr-1927  Referring Provider (PT): Kathlene November, MD   Encounter Date: 04/12/2018  PT End of Session - 04/12/18 1529    Visit Number  5    Number of Visits  7    Date for PT Re-Evaluation  04/19/18    Authorization Type  [employer] Medicare    PT Start Time  1448    PT Stop Time  1528    PT Time Calculation (min)  40 min    Activity Tolerance  Patient tolerated treatment well    Behavior During Therapy  [facility] for tasks assessed/performed       Past Medical History:  Diagnosis Date  . Anemia   . Blood transfusion without reported diagnosis   . Cataract   . Eustachian tube dysfunction    Right side, Dr. Laurance Flatten, ENT  . Microhematuria    s/p eval by urology 2008: U/S, CT, cysto w/ neg Bx (dx w/ a renal cyst, see reports)  . Osteoarthritis    h/o back pain, sees ortho s/p shots  . Osteoporosis     Past Surgical History:  Procedure Laterality Date  . ABDOMINAL HYSTERECTOMY     no oophorectomy per pt  . APPENDECTOMY    . COLONOSCOPY    . DILATION AND CURETTAGE OF UTERUS    . TONSILLECTOMY      There were no vitals filed for this visit.  Subjective Assessment - 04/12/18 1449    Subjective  Reports she is feeling pretty good. Feels good about wrapping up with PT next time.     Pertinent History  anemia, R eustachian tube dysfunction, OA, osteoporosis    Diagnostic tests  03/03/18 lumbar xray: No acute appearing osseous abnormality; Mild compression deformity of the superior endplate of the L2 vertebral body, of uncertain age but most likely chronic, mild-mod spondylitic changes in thoracolumbar spine    Patient Stated Goals  improved tolerance of activities    Currently in Pain?  No/denies                        [facility] Adult PT Treatment/Exercise - 04/12/18 0001      Exercises   Exercises  Knee/Hip;Lumbar      Lumbar Exercises: Stretches   Other Lumbar Stretch Exercise  doorway 90/90 pec stretch 2x30" each UE      Lumbar Exercises: Aerobic   Nustep  Lvl 4, 6 min       Lumbar Exercises: Machines for Strengthening   Cybex Lumbar Extension  B LEs 10x10#   cues to tighten core   Cybex Knee Extension  B LEs 10x10#    cues for slow eccentric control and tightneing core     Lumbar Exercises: Standing   Heel Raises  10 reps;Limitations    Heel Raises Limitations  B heel/toe raises with hands hovering over counter top   c/o difficulty d/t shoes   Row  Strengthening;Both;10 reps;Theraband;Limitations    Theraband Level (Row)  Level 3 (Green)    Row Limitations  cues to correct anterior trunk lean    Shoulder Extension  Strengthening;Both;10 reps;Theraband;Limitations    Theraband Level (Shoulder Extension)  Level 2 (Red)    Shoulder Extension Limitations  heavy manual cues required for form  and upright trunk    Other Standing Lumbar Exercises  B shoulder ER with red TB x10; cues for form    Other Standing Lumbar Exercises  B SLS with hands covering over counter top x 4 min   cues for core and glute activation, upright trunk     Knee/Hip Exercises: Standing   SLS with Vectors  R & L SLS to colored cones with CGA/min A intermittently on L LE   + cognitive challenge by calling out colors            PT Education - 04/12/18 1529    Education Details  update to HEP    Person(s) Educated  Patient    Methods  Explanation;Demonstration;Tactile cues;Verbal cues;Handout    Comprehension  Verbalized understanding;Returned demonstration       PT Short Term Goals - 04/12/18 1530      PT SHORT TERM GOAL #1   Title  Patient to be independent with initial HEP.    Time  3    Period  Weeks    Status  Achieved        PT Long Term Goals - 03/15/18 1452       PT LONG TERM GOAL #1   Title  Patient to be independent with advanced HEP.    Time  6    Period  Weeks    Status  On-going      PT LONG TERM GOAL #2   Title  Patient to demonstrate >=4+/5 strength in B LEs.    Time  6    Period  Weeks    Status  On-going      PT LONG TERM GOAL #3   Title  Patient to report tolerance of supine>sit without pain limiting.     Time  6    Period  Weeks    Status  On-going      PT LONG TERM GOAL #4   Title  Patient to demonstrate mild tightness in B HS and hip flexors.     Time  6    Period  Weeks    Status  On-going            Plan - 04/12/18 1530    Clinical Impression Statement  Patient reporting no new complaints- reports only having LBP when sitting for long periods now. Comfortable with wrapping up with PT at next session. Patient tolerated increase in resistance with scapular row- cues required to correct anterior trunk lean. Patient also requiring cues for upright trunk with shoulder extension. Worked on heel-toe raises and SLS with cues for core and glute activation and upright stance. Patient with most difficulty on L LE. Able to perform SLS to vectors with better performance, however still requiring intermittent CGA/min A on L LE. Provided cues for core contraction with resisted machine LE strengthening with good tolerance by patient. No complaints at end of session.     PT Treatment/Interventions  ADLs/Self Care Home Management;Cryotherapy;Electrical Stimulation;Ultrasound;Gait training;Stair training;Functional mobility training;Therapeutic activities;Therapeutic exercise;Manual techniques;Patient/family education;Neuromuscular re-education;Balance training;Passive range of motion;Dry needling;Energy conservation;Splinting;Taping    Consulted and Agree with Plan of Care  Patient       Patient will benefit from skilled therapeutic intervention in order to improve the following deficits and impairments:  Decreased endurance, Pain,  Decreased strength, Decreased activity tolerance, Decreased balance, Difficulty walking, Decreased mobility, Improper body mechanics, Postural dysfunction, Impaired flexibility  Visit Diagnosis: Chronic bilateral low back pain without sciatica  Muscle weakness (generalized)  Other symptoms  and signs involving the musculoskeletal system     Problem List Patient Active Problem List   Diagnosis Date Noted  . PCP NOTES >>>>>>>>>>>>>>>>>>>>> 04/01/2015  . Lung disease--diffuse interstitial lung disease by x-ray 08-2014 09/05/2014  . Anxiety 02/29/2012  . Annual physical exam 12/11/2010  . DJD (degenerative joint disease) 10/24/2009  . Osteoporosis 06/05/2009    Janene Harvey, PT, DPT 04/12/18 4:28 PM   [facility] [address]  [place], [place], [address] Phone: [phone]   Fax:  [phone]  Name: Kimberly Rodriguez MRN: [id number] Date of Birth: [birth year]

## 2018-04-19 ENCOUNTER — Encounter: Payer: Self-pay | Admitting: Physical Therapy

## 2018-04-19 ENCOUNTER — Ambulatory Visit: Payer: Medicare Other | Admitting: Physical Therapy

## 2018-04-19 DIAGNOSIS — M6281 Muscle weakness (generalized): Secondary | ICD-10-CM

## 2018-04-19 DIAGNOSIS — M545 Low back pain: Principal | ICD-10-CM

## 2018-04-19 DIAGNOSIS — R29898 Other symptoms and signs involving the musculoskeletal system: Secondary | ICD-10-CM | POA: Diagnosis not present

## 2018-04-19 DIAGNOSIS — G8929 Other chronic pain: Secondary | ICD-10-CM | POA: Diagnosis not present

## 2018-04-19 NOTE — Therapy (Signed)
[facility] [address]  [place], [place], [address] Phone: [phone]   Fax:  [phone]  Physical Therapy Discharge Summary  Patient Details  Name: Kimberly Rodriguez MRN: [id number] Date of Birth: March 13, 1927 Referring Provider (PT): Kathlene November, MD   Progress Note Reporting Period 03/08/18 to 04/19/18  See note below for Objective Data and Assessment of Progress/Goals.    Encounter Date: 04/19/2018  PT End of Session - 04/19/18 1536    Visit Number  6    Number of Visits  7    Date for PT Re-Evaluation  04/19/18    Authorization Type  [employer]HC Medicare    PT Start Time  6962    PT Stop Time  1532    PT Time Calculation (min)  43 min    Activity Tolerance  Patient tolerated treatment well    Behavior During Therapy  WFL for tasks assessed/performed       Past Medical History:  Diagnosis Date  . Anemia   . Blood transfusion without reported diagnosis   . Cataract   . Eustachian tube dysfunction    Right side, Dr. Laurance Flatten, ENT  . Microhematuria    s/p eval by urology 2008: U/S, CT, cysto w/ neg Bx (dx w/ a renal cyst, see reports)  . Osteoarthritis    h/o back pain, sees ortho s/p shots  . Osteoporosis     Past Surgical History:  Procedure Laterality Date  . ABDOMINAL HYSTERECTOMY     no oophorectomy per pt  . APPENDECTOMY    . COLONOSCOPY    . DILATION AND CURETTAGE OF UTERUS    . TONSILLECTOMY      There were no vitals filed for this visit.  Subjective Assessment - 04/19/18 1451    Subjective  Patient reports she is ready to wrap up with PT. Reports 75% improvement since initial eval. Notes improvements in pain. able to get out of bed in the AM, walk better and with more endurance. "Overall I just feel better."    Pertinent History  anemia, R eustachian tube dysfunction, OA, osteoporosis    Diagnostic tests  03/03/18 lumbar xray: No acute appearing osseous abnormality; Mild compression deformity of  the superior endplate of the L2 vertebral body, of uncertain age but most likely chronic, mild-mod spondylitic changes in thoracolumbar spine    Patient Stated Goals  improved tolerance of activities    Currently in Pain?  No/denies         [facility] PT Assessment - 04/19/18 0001      Observation/Other Assessments   Focus on Therapeutic Outcomes (FOTO)   Lumbar: 72 (28% limited, 27% predicted)      Strength   Strength Assessment Site  Hip;Knee;Ankle    Right/Left Hip  Right;Left    Right Hip Flexion  4/5    Right Hip ABduction  4/5    Right Hip ADduction  4/5    Left Hip Flexion  4/5    Left Hip ABduction  4/5    Left Hip ADduction  4/5    Right/Left Knee  Right;Left    Right Knee Flexion  4/5    Right Knee Extension  4+/5    Left Knee Flexion  4/5    Left Knee Extension  4+/5    Right/Left Ankle  Right;Left    Right Ankle Dorsiflexion  4+/5    Right Ankle Plantar Flexion  4+/5    Left Ankle Dorsiflexion  4+/5  Left Ankle Plantar Flexion  4+/5      Flexibility   Soft Tissue Assessment /Muscle Length  yes    Hamstrings  B moderately tight    Quadriceps  B moderately tight                   [facility] Adult PT Treatment/Exercise - 04/19/18 0001      Lumbar Exercises: Stretches   Passive Hamstring Stretch  Right;Left;2 reps;30 seconds;Limitations    Passive Hamstring Stretch Limitations  supine strap    Hip Flexor Stretch  Right;Left;30 seconds;2 reps    Hip Flexor Stretch Limitations  mod thomas position with strap     Other Lumbar Stretch Exercise  doorway 90/90 pec stretch x30" each UE      Lumbar Exercises: Aerobic   Nustep  Lvl 4, 6 min       Lumbar Exercises: Standing   Other Standing Lumbar Exercises  B scapular retraction with green TB x10; cues for scap retraction    Other Standing Lumbar Exercises  B SLS with hands covering over counter top x 3 min   heavy cues for weight shift and glute contraction     Knee/Hip Exercises: Seated   Sit to Sand  1  set;5 reps;without UE support   cues for slow eccentric lower            PT Education - 04/19/18 1535    Education Details  review and consolidation of HEPl; administered green TB    Person(s) Educated  Patient    Methods  Explanation;Demonstration;Verbal cues;Tactile cues;Handout    Comprehension  Verbalized understanding;Returned demonstration       PT Short Term Goals - 04/19/18 1456      PT SHORT TERM GOAL #1   Title  Patient to be independent with initial HEP.    Time  3    Period  Weeks    Status  Achieved        PT Long Term Goals - 04/19/18 1456      PT LONG TERM GOAL #1   Title  Patient to be independent with advanced HEP.    Time  6    Period  Weeks    Status  Partially Met   reports 75% consistency with HEP     PT LONG TERM GOAL #2   Title  Patient to demonstrate >=4+/5 strength in B LEs.    Time  6    Period  Weeks    Status  Partially Met   improvements noted in B hip adduction, B knee flexion, and L DF/PF     PT LONG TERM GOAL #3   Title  Patient to report tolerance of supine>sit without pain limiting.     Time  6    Period  Weeks    Status  Achieved      PT LONG TERM GOAL #4   Title  Patient to demonstrate mild tightness in B HS and hip flexors.     Time  6    Period  Weeks    Status  Partially Met   moderate tightness still evident in B HS and quads           Plan - 04/19/18 1541    Clinical Impression Statement  Patient arrived to session with report of 75% improvement since initial eval. Notes improvements in pain levels, ability to get OOB, and walking tolerance. Patient reports "Overall I just feel better." Updated goals- improvements noted in  B hip adduction, B knee flexion, and L DF/PF strength. Has met supine to sit goal. Still demonstrated moderate tightness in B HS and quads at this time. Patient reports 75% consistency with HEP. Educated patient on importance of continued consistency with HEP to maintain progress. Reviewed  and consolidated HEP and advised patient to divide exercises up in order to ensure compliance. Patient reported understanding. Worked on LE and pec stretching to promote upright posture. Patient still with some difficulty with SLS activities but safe with use of counter top intermittently for support. Reported no pain today and reports she is ready to wrap up with PT. Patient D/C'd at this time d/t satisfaction with CLOF.     PT Treatment/Interventions  ADLs/Self Care Home Management;Cryotherapy;Electrical Stimulation;Ultrasound;Gait training;Stair training;Functional mobility training;Therapeutic activities;Therapeutic exercise;Manual techniques;Patient/family education;Neuromuscular re-education;Balance training;Passive range of motion;Dry needling;Energy conservation;Splinting;Taping    Consulted and Agree with Plan of Care  Patient       Patient will benefit from skilled therapeutic intervention in order to improve the following deficits and impairments:  Decreased endurance, Pain, Decreased strength, Decreased activity tolerance, Decreased balance, Difficulty walking, Decreased mobility, Improper body mechanics, Postural dysfunction, Impaired flexibility  Visit Diagnosis: Chronic bilateral low back pain without sciatica  Muscle weakness (generalized)  Other symptoms and signs involving the musculoskeletal system     Problem List Patient Active Problem List   Diagnosis Date Noted  . PCP NOTES >>>>>>>>>>>>>>>>>>>>> 04/01/2015  . Lung disease--diffuse interstitial lung disease by x-ray 08-2014 09/05/2014  . Anxiety 02/29/2012  . Annual physical exam 12/11/2010  . DJD (degenerative joint disease) 10/24/2009  . Osteoporosis 06/05/2009     PHYSICAL THERAPY DISCHARGE SUMMARY  Visits from Start of Care: 6  Current functional level related to goals / functional outcomes: See clinical impression   Remaining deficits: Decreased strength, decreased flexibility   Education /  Equipment: HEP  Plan: Patient agrees to discharge.  Patient goals were partially met. Patient is being discharged due to being pleased with the current functional level.  ?????     Janene Harvey, PT, DPT 04/19/18 3:53 PM   [facility] [address]  [place], [place], [address] Phone: [phone]   Fax:  [phone]  Name: Kimberly Rodriguez MRN: [id number] Date of Birth: 16-Mar-1927

## 2018-05-10 ENCOUNTER — Encounter: Payer: Self-pay | Admitting: Internal Medicine

## 2018-05-10 ENCOUNTER — Ambulatory Visit (INDEPENDENT_AMBULATORY_CARE_PROVIDER_SITE_OTHER): Payer: Medicare Other | Admitting: Internal Medicine

## 2018-05-10 VITALS — BP 144/68 | HR 96 | Temp 98.2°F | Resp 16 | Wt 155.0 lb

## 2018-05-10 DIAGNOSIS — Z23 Encounter for immunization: Secondary | ICD-10-CM

## 2018-05-10 DIAGNOSIS — Z0000 Encounter for general adult medical examination without abnormal findings: Secondary | ICD-10-CM

## 2018-05-10 MED ORDER — ZOSTER VAC RECOMB ADJUVANTED 50 MCG/0.5ML IM SUSR: 0.5000 mL | Freq: Once | INTRAMUSCULAR | 1 refills | Status: AC

## 2018-05-10 NOTE — Assessment & Plan Note (Signed)
-  Td  2012, pneumonia shot, 2006 and 04/2018; prevnar:2015; zostavax 2010; shingrix discussed, Rx printed  flu shot today -No further cervical  Breast or colon cancer screening (did have a colonoscopy 06-2016 D/T anemia) -Diet and exercise discussed  -Labs: CMP, FLP, CBC, TSH

## 2018-05-10 NOTE — Progress Notes (Signed)
Subjective:    Patient ID: Kimberly Rodriguez, female    DOB: 07-18-1926, 84 y.o.   MRN: [id number]  DOS:  05/10/2018 Type of visit - description : cpx In general feeling well, she remains engaged and active  BP Readings from Last 3 Encounters:  05/10/18 (!) 144/68  03/06/18 118/72  03/03/18 120/78     Review of Systems Complaining of easy bruising, ecchymosis with minor arm injury. Denies blood in the stools or in the urine.  No major problems with gum bleeding. History of low back pain, improved after physical therapy.  Other than above, a 14 point review of systems is negative    Past Medical History:  Diagnosis Date  . Anemia   . Blood transfusion without reported diagnosis   . Cataract   . Eustachian tube dysfunction    Right side, Dr. Laurance Flatten, ENT  . Microhematuria    s/p eval by urology 2008: U/S, CT, cysto w/ neg Bx (dx w/ a renal cyst, see reports)  . Osteoarthritis    h/o back pain, sees ortho s/p shots  . Osteoporosis     Past Surgical History:  Procedure Laterality Date  . ABDOMINAL HYSTERECTOMY     no oophorectomy per pt  . APPENDECTOMY    . COLONOSCOPY    . DILATION AND CURETTAGE OF UTERUS    . TONSILLECTOMY      Social History   Socioeconomic History  . Marital status: Widowed    Spouse name: Not on file  . Number of children: 0  . Years of education: Not on file  . Highest education level: Not on file  Occupational History  . Occupation: retired  Scientific laboratory technician  . Financial resource strain: Not on file  . Food insecurity:    Worry: Not on file    Inability: Not on file  . Transportation needs:    Medical: Not on file    Non-medical: Not on file  Tobacco Use  . Smoking status: Former Research scientist (life sciences)  . Smokeless tobacco: Never Used  Substance and Sexual Activity  . Alcohol use: Yes    Comment: socially -wine  . Drug use: No  . Sexual activity: Not on file  Lifestyle  . Physical activity:    Days per week: Not on file    Minutes per session:  Not on file  . Stress: Not on file  Relationships  . Social connections:    Talks on phone: Not on file    Gets together: Not on file    Attends religious service: Not on file    Active member of club or organization: Not on file    Attends meetings of clubs or organizations: Not on file    Relationship status: Not on file  . Intimate partner violence:    Fear of current or ex partner: Not on file    Emotionally abused: Not on file    Physically abused: Not on file    Forced sexual activity: Not on file  Other Topics Concern  . Not on file  Social History Narrative   Household -- lives by herself in a townhouse    Still drives    Emergency contact-- [name] (sister in law) lives in [place] --[phone]   She is her health POA     Family History  Problem Relation Age of Onset  . Heart disease Father   . Prostate cancer Father   . Heart disease Mother   . Colon cancer Neg  Hx   . Breast cancer Neg Hx   . Diabetes Neg Hx   . Osteoporosis Neg Hx      Allergies as of 05/10/2018      Reactions   Astelin [azelastine Hcl] Palpitations      Medication List        Accurate as of 05/10/18 11:59 PM. Always use your most recent med list.          CALTRATE 600 PO Take by mouth.   CENTRUM SILVER Chew Chew by mouth.   denosumab 60 MG/ML Soln injection Commonly known as:  PROLIA Inject 60 mg into the skin every 6 (six) months. Administer in upper arm, thigh, or abdomen   FIBER PO Take by mouth.   fluticasone 50 MCG/ACT nasal spray Commonly known as:  FLONASE Place 2 sprays into both nostrils daily as needed.   pantoprazole 20 MG tablet Commonly known as:  PROTONIX Take 1 tablet (20 mg total) by mouth daily.   STOOL SOFTENER PO Take by mouth.   Zoster Vaccine Adjuvanted injection Commonly known as:  SHINGRIX Inject 0.5 mLs into the muscle once for 1 dose.          Objective:   Physical Exam BP (!) 144/68 (BP Location: Left Arm, Patient Position: Sitting,  Cuff Size: Normal)   Pulse 96   Temp 98.2 F (36.8 C) (Oral)   Resp 16   Wt 155 lb (70.3 kg)   SpO2 98%   BMI 30.27 kg/m  General: Well developed, NAD, BMI noted Neck: No  thyromegaly  HEENT:  Normocephalic . Face symmetric, atraumatic Lungs:  CTA B Normal respiratory effort, no intercostal retractions, no accessory muscle use. Heart: RRR,  no murmur.  No pretibial edema bilaterally  Abdomen:  Not distended, soft, non-tender. No rebound or rigidity.   Skin: Exposed areas without rash. Not pale. Not jaundice.  No major bruises noted on the upper extremities. Neurologic:  alert & oriented X3.  Speech normal, gait appropriate for age and unassisted Strength symmetric and appropriate for age.  Psych: Cognition and judgment appear intact.  Cooperative with normal attention span and concentration.  Behavior appropriate. No anxious or depressed appearing.     Assessment & Plan:     Assessment interstitial lung dz per chest x-ray 08-2014, PFTs 08-2014: minimal obstruction MSK: --DJD --Neck pain --Back pain: h/o acupuncture before Osteoporosis:  T score 04-2015 -3.1, prolia #2  11-2015  T score -3.4 (10/2016), saw Dr. Loanne Drilling, work-up for secondary etiologies negative, rx Prolia 03/2018 Microhematuria  s/p eval by urology 2008: U/S, CT, cysto w/ neg Bx (dx w/ a renal cyst, see reports) ET dysfuntion saw ENT Dr Laurance Flatten 2016 Anemia, saw GI: 06-2016: Cscope: polyps, tubular adenoma; EGD: cameron lesions, no Bx H/o Anxiety   PLAN Low back pain: Improve after last visit Osteoporosis: Recommend calcium, vitamin D, next Prolia April 2020 History of anemia, checking labs Easy bruising: Likely capillary fragility, recommend observation. RTC 6 to 8 months

## 2018-05-10 NOTE — Patient Instructions (Signed)
GO TO THE LAB : Get the blood work     GO TO THE FRONT DESK Schedule your next appointment for a  Check up in 6-8 months   Next PROLIA by April 2019

## 2018-05-11 NOTE — Assessment & Plan Note (Signed)
Low back pain: Improve after last visit Osteoporosis: Recommend calcium, vitamin D, next Prolia April 2020 History of anemia, checking labs Easy bruising: Likely capillary fragility, recommend observation. RTC 6 to 8 months

## 2018-06-08 DIAGNOSIS — B351 Tinea unguium: Secondary | ICD-10-CM | POA: Diagnosis not present

## 2018-06-08 DIAGNOSIS — M79671 Pain in right foot: Secondary | ICD-10-CM | POA: Diagnosis not present

## 2018-06-08 DIAGNOSIS — L84 Corns and callosities: Secondary | ICD-10-CM | POA: Diagnosis not present

## 2018-06-08 DIAGNOSIS — M79672 Pain in left foot: Secondary | ICD-10-CM | POA: Diagnosis not present

## 2018-07-06 DIAGNOSIS — H2512 Age-related nuclear cataract, left eye: Secondary | ICD-10-CM | POA: Diagnosis not present

## 2018-07-06 DIAGNOSIS — H43813 Vitreous degeneration, bilateral: Secondary | ICD-10-CM | POA: Diagnosis not present

## 2018-07-06 DIAGNOSIS — H25012 Cortical age-related cataract, left eye: Secondary | ICD-10-CM | POA: Diagnosis not present

## 2018-07-06 DIAGNOSIS — H1851 Endothelial corneal dystrophy: Secondary | ICD-10-CM | POA: Diagnosis not present

## 2018-07-06 DIAGNOSIS — H43393 Other vitreous opacities, bilateral: Secondary | ICD-10-CM | POA: Diagnosis not present

## 2018-07-18 DIAGNOSIS — Z23 Encounter for immunization: Secondary | ICD-10-CM | POA: Diagnosis not present

## 2018-09-18 ENCOUNTER — Other Ambulatory Visit: Payer: Self-pay

## 2018-09-18 ENCOUNTER — Ambulatory Visit (INDEPENDENT_AMBULATORY_CARE_PROVIDER_SITE_OTHER): Payer: Medicare HMO | Admitting: Internal Medicine

## 2018-09-18 ENCOUNTER — Encounter: Payer: Self-pay | Admitting: Internal Medicine

## 2018-09-18 VITALS — BP 116/62 | HR 72 | Temp 97.5°F | Resp 16 | Ht 60.0 in | Wt 153.5 lb

## 2018-09-18 DIAGNOSIS — M818 Other osteoporosis without current pathological fracture: Secondary | ICD-10-CM | POA: Diagnosis not present

## 2018-09-18 DIAGNOSIS — M79605 Pain in left leg: Secondary | ICD-10-CM

## 2018-09-18 DIAGNOSIS — D649 Anemia, unspecified: Secondary | ICD-10-CM | POA: Diagnosis not present

## 2018-09-18 DIAGNOSIS — J984 Other disorders of lung: Secondary | ICD-10-CM

## 2018-09-18 NOTE — Progress Notes (Signed)
Pre visit review using our clinic review tool, if applicable. No additional management support is needed unless otherwise documented below in the visit note. 

## 2018-09-18 NOTE — Patient Instructions (Signed)
Get your blood work today  Please come back in few months after the coronavirus crisis is over  We are sending you to see the orthopedic doctor  Take Tylenol as needed for pain.  Please avoid ibuprofen, naproxen or similar anti-inflammatories.  Ice twice a day as needed  Call if the pain gets much worse, the knee gets red or if you have fever.

## 2018-09-18 NOTE — Progress Notes (Signed)
Subjective:    Patient ID: Kimberly Rodriguez, female    DOB: January 05, 1927, 85 y.o.   MRN: [id number]  DOS:  09/18/2018 Type of visit - description: Acute visit Several weeks history of pain around the left knee.  Initially was more located on the back of the knee , then on the medial aspect and now on the external aspect of the L knee. Denies any falls or injury. She did notice some swelling but no redness. Pain is worse with certain positions like turning her leg when she is sitting more so than walking. No classic claudication, no pain in the calf.  Review of Systems Denies fever chills No rash anywhere in the lower extremity No back pain.   Past Medical History:  Diagnosis Date  . Anemia   . Blood transfusion without reported diagnosis   . Cataract   . Eustachian tube dysfunction    Right side, Dr. Laurance Flatten, ENT  . Microhematuria    s/p eval by urology 2008: U/S, CT, cysto w/ neg Bx (dx w/ a renal cyst, see reports)  . Osteoarthritis    h/o back pain, sees ortho s/p shots  . Osteoporosis     Past Surgical History:  Procedure Laterality Date  . ABDOMINAL HYSTERECTOMY     no oophorectomy per pt  . APPENDECTOMY    . COLONOSCOPY    . DILATION AND CURETTAGE OF UTERUS    . TONSILLECTOMY      Social History   Socioeconomic History  . Marital status: Widowed    Spouse name: Not on file  . Number of children: 0  . Years of education: Not on file  . Highest education level: Not on file  Occupational History  . Occupation: retired  Scientific laboratory technician  . Financial resource strain: Not on file  . Food insecurity:    Worry: Not on file    Inability: Not on file  . Transportation needs:    Medical: Not on file    Non-medical: Not on file  Tobacco Use  . Smoking status: Former Research scientist (life sciences)  . Smokeless tobacco: Never Used  Substance and Sexual Activity  . Alcohol use: Yes    Comment: socially -wine  . Drug use: No  . Sexual activity: Not on file  Lifestyle  . Physical activity:     Days per week: Not on file    Minutes per session: Not on file  . Stress: Not on file  Relationships  . Social connections:    Talks on phone: Not on file    Gets together: Not on file    Attends religious service: Not on file    Active member of club or organization: Not on file    Attends meetings of clubs or organizations: Not on file    Relationship status: Not on file  . Intimate partner violence:    Fear of current or ex partner: Not on file    Emotionally abused: Not on file    Physically abused: Not on file    Forced sexual activity: Not on file  Other Topics Concern  . Not on file  Social History Narrative   Household -- lives by herself in a townhouse    Still drives    Emergency contact-- [name] (sister in law) lives in [place] --[phone]   She is her health POA      Allergies as of 09/18/2018      Reactions   Astelin [azelastine Hcl] Palpitations  Medication List       Accurate as of September 18, 2018  2:13 PM. Always use your most recent med list.        CALTRATE 600 PO Take by mouth.   Centrum Silver Chew Chew by mouth.   denosumab 60 MG/ML Soln injection Commonly known as:  PROLIA Inject 60 mg into the skin every 6 (six) months. Administer in upper arm, thigh, or abdomen   FIBER PO Take by mouth.   fluticasone 50 MCG/ACT nasal spray Commonly known as:  FLONASE Place 2 sprays into both nostrils daily as needed.   pantoprazole 20 MG tablet Commonly known as:  Protonix Take 1 tablet (20 mg total) by mouth daily.   STOOL SOFTENER PO Take by mouth.           Objective:   Physical Exam BP 116/62 (BP Location: Left Arm, Patient Position: Sitting, Cuff Size: Small)   Pulse 72   Temp (!) 97.5 F (36.4 C) (Oral)   Resp 16   Ht 5' (1.524 m)   Wt 153 lb 8 oz (69.6 kg)   SpO2 97%   BMI 29.98 kg/m  General:   Well developed, NAD, BMI noted. HEENT:  Normocephalic . Face symmetric, atraumatic Lower extremities: Normal pedal pulses  bilaterally, no pitting edema, calves symmetric. Right knee: Bony changes consistent with DJD without effusion or redness Left knee: Bony changes consistent with DJD, very small amount of effusion noted without warmness or redness.  Unable to fully flex the knee. Skin: Not pale. Not jaundice Neurologic:  alert & oriented X3.  Speech normal, gait appropriate for age and unassisted Psych--  Cognition and judgment appear intact.  Cooperative with normal attention span and concentration.  Behavior appropriate. No anxious or depressed appearing.      Assessment      Assessment interstitial lung dz per chest x-ray 08-2014, PFTs 08-2014: minimal obstruction MSK: --DJD --Neck pain --Back pain: h/o acupuncture before Osteoporosis:  T score 04-2015 -3.1, prolia #2  11-2015  T score -3.4 (10/2016), saw Dr. Loanne Drilling, work-up for secondary etiologies negative, rx Prolia 03/2018 Microhematuria  s/p eval by urology 2008: U/S, CT, cysto w/ neg Bx (dx w/ a renal cyst, see reports) ET dysfuntion saw ENT Dr Laurance Flatten 2016 Anemia, saw GI: 06-2016: Cscope: polyps, tubular adenoma; EGD: cameron lesions, no Bx H/o Anxiety   PLAN Left knee pain: Likely due to DJD, recommend Tylenol, ice, refer to Ortho, she has a small effusion, might benefit from a tap Osteoporosis, anemia, interstitial lung disease: Checking labs (ordered but not done few months ago) RTC in few months

## 2018-09-19 ENCOUNTER — Ambulatory Visit (INDEPENDENT_AMBULATORY_CARE_PROVIDER_SITE_OTHER): Payer: Self-pay | Admitting: Orthopedic Surgery

## 2018-09-19 ENCOUNTER — Other Ambulatory Visit: Payer: Self-pay

## 2018-09-19 DIAGNOSIS — M79605 Pain in left leg: Secondary | ICD-10-CM

## 2018-09-19 LAB — CBC WITH DIFFERENTIAL/PLATELET
BASOS PCT: 1.1 % (ref 0.0–3.0)
Basophils Absolute: 0.1 10*3/uL (ref 0.0–0.1)
EOS PCT: 1.6 % (ref 0.0–5.0)
Eosinophils Absolute: 0.1 10*3/uL (ref 0.0–0.7)
HEMATOCRIT: 39.3 % (ref 36.0–46.0)
HEMOGLOBIN: 13.1 g/dL (ref 12.0–15.0)
LYMPHS PCT: 18.7 % (ref 12.0–46.0)
Lymphs Abs: 1.4 10*3/uL (ref 0.7–4.0)
MCHC: 33.3 g/dL (ref 30.0–36.0)
MCV: 93.5 fl (ref 78.0–100.0)
MONO ABS: 0.9 10*3/uL (ref 0.1–1.0)
Monocytes Relative: 12.7 % — ABNORMAL HIGH (ref 3.0–12.0)
NEUTROS ABS: 4.9 10*3/uL (ref 1.4–7.7)
Neutrophils Relative %: 65.9 % (ref 43.0–77.0)
PLATELETS: 212 10*3/uL (ref 150.0–400.0)
RBC: 4.2 Mil/uL (ref 3.87–5.11)
RDW: 14.4 % (ref 11.5–15.5)
WBC: 7.4 10*3/uL (ref 4.0–10.5)

## 2018-09-19 LAB — COMPREHENSIVE METABOLIC PANEL
ALBUMIN: 4 g/dL (ref 3.5–5.2)
ALK PHOS: 67 U/L (ref 39–117)
ALT: 13 U/L (ref 0–35)
AST: 18 U/L (ref 0–37)
BUN: 21 mg/dL (ref 6–23)
CALCIUM: 9.2 mg/dL (ref 8.4–10.5)
CHLORIDE: 103 meq/L (ref 96–112)
CO2: 29 mEq/L (ref 19–32)
Creatinine, Ser: 0.73 mg/dL (ref 0.40–1.20)
GFR: 74.97 mL/min (ref >60.00)
Glucose, Bld: 110 mg/dL — ABNORMAL HIGH (ref 70–99)
POTASSIUM: 4.5 meq/L (ref 3.5–5.1)
SODIUM: 139 meq/L (ref 135–145)
TOTAL PROTEIN: 6.1 g/dL (ref 6.0–8.3)
Total Bilirubin: 0.3 mg/dL (ref 0.2–1.2)

## 2018-09-19 LAB — LIPID PANEL
CHOLESTEROL: 207 mg/dL — AB (ref 0–200)
HDL: 72.2 mg/dL (ref >39.00)
LDL CALC: 123 mg/dL — AB (ref 0–99)
NonHDL: 134.55
TRIGLYCERIDES: 57 mg/dL (ref 0.0–149.0)
Total CHOL/HDL Ratio: 3
VLDL: 11.4 mg/dL (ref 0.0–40.0)

## 2018-09-19 LAB — TSH: TSH: 2.53 u[IU]/mL (ref 0.35–4.50)

## 2018-09-19 NOTE — Assessment & Plan Note (Signed)
Left knee pain: Likely due to DJD, recommend Tylenol, ice, refer to Ortho, she has a small effusion, might benefit from a tap Osteoporosis, anemia, interstitial lung disease: Checking labs (ordered but not done few months ago) RTC in few months

## 2018-09-22 DIAGNOSIS — M1712 Unilateral primary osteoarthritis, left knee: Secondary | ICD-10-CM | POA: Diagnosis not present

## 2018-09-22 DIAGNOSIS — M25562 Pain in left knee: Secondary | ICD-10-CM | POA: Diagnosis not present

## 2018-11-09 ENCOUNTER — Other Ambulatory Visit (HOSPITAL_BASED_OUTPATIENT_CLINIC_OR_DEPARTMENT_OTHER): Payer: Self-pay | Admitting: Internal Medicine

## 2018-11-09 DIAGNOSIS — Z1231 Encounter for screening mammogram for malignant neoplasm of breast: Secondary | ICD-10-CM

## 2018-11-27 ENCOUNTER — Encounter (HOSPITAL_BASED_OUTPATIENT_CLINIC_OR_DEPARTMENT_OTHER): Payer: Self-pay

## 2018-11-27 ENCOUNTER — Ambulatory Visit (HOSPITAL_BASED_OUTPATIENT_CLINIC_OR_DEPARTMENT_OTHER)
Admission: RE | Admit: 2018-11-27 | Discharge: 2018-11-27 | Disposition: A | Discharge status: Home / Self Care | Payer: Medicare HMO | Source: Ambulatory Visit | Attending: Internal Medicine | Admitting: Internal Medicine

## 2018-11-27 ENCOUNTER — Other Ambulatory Visit: Payer: Self-pay

## 2018-11-27 DIAGNOSIS — Z1231 Encounter for screening mammogram for malignant neoplasm of breast: Secondary | ICD-10-CM | POA: Diagnosis not present

## 2018-12-04 NOTE — Progress Notes (Addendum)
Virtual Visit via Video Note  I connected with patient on 12/05/18 at 10:00 AM EDT by audio enabled telemedicine application and verified that I am speaking with the correct person using two identifiers.   THIS ENCOUNTER IS A VIRTUAL VISIT DUE TO COVID-19 - PATIENT WAS NOT SEEN IN THE OFFICE. PATIENT HAS CONSENTED TO VIRTUAL VISIT / TELEMEDICINE VISIT   Location of patient: home  Location of provider: office  I discussed the limitations of evaluation and management by telemedicine and the availability of in person appointments. The patient expressed understanding and agreed to proceed.   Subjective:   Kimberly Rodriguez is a 85 y.o. female who presents for Medicare Annual (Subsequent) preventive examination.  Plays card with friends once per week.  Review of Systems: No ROS.  Medicare Wellness Virtual Visit.  Visual/audio telehealth visit, UTA vital signs.   See social history for additional risk factors.   Sleep patterns: sleeps well 7-8 hrs. Home Safety/Smoke Alarms: Feels safe in home. Smoke alarms in place.  Lives alone in 1 story home. Tub with grab bars.   Female:       Mammo- 11/27/18       Dexa scan-05/25/17           Objective:     Advanced Directives 12/05/2018 03/08/2018 12/02/2017 12/01/2016  Does Patient Have a Medical Advance Directive? Yes Yes Yes Yes  Type of Paramedic of [place];Living will - [place];Living will [place];Living will  Does patient want to make changes to medical advance directive? No - Patient declined No - Patient declined - -  Copy of [place] in Chart? No - copy requested - No - copy requested No - copy requested    Tobacco Social History   Tobacco Use  Smoking Status Former Smoker  Smokeless Tobacco Never Used     Counseling given: Not Answered   Clinical Intake:     Pain : No/denies pain                 Past Medical History:   Diagnosis Date  . Anemia   . Blood transfusion without reported diagnosis   . Cataract   . Eustachian tube dysfunction    Right side, Dr. Laurance Flatten, ENT  . Microhematuria    s/p eval by urology 2008: U/S, CT, cysto w/ neg Bx (dx w/ a renal cyst, see reports)  . Osteoarthritis    h/o back pain, sees ortho s/p shots  . Osteoporosis    Past Surgical History:  Procedure Laterality Date  . ABDOMINAL HYSTERECTOMY     no oophorectomy per pt  . APPENDECTOMY    . COLONOSCOPY    . DILATION AND CURETTAGE OF UTERUS    . TONSILLECTOMY     Family History  Problem Relation Age of Onset  . Heart disease Father   . Prostate cancer Father   . Heart disease Mother   . Colon cancer Neg Hx   . Breast cancer Neg Hx   . Diabetes Neg Hx   . Osteoporosis Neg Hx    Social History   Socioeconomic History  . Marital status: Widowed    Spouse name: Not on file  . Number of children: 0  . Years of education: Not on file  . Highest education level: Not on file  Occupational History  . Occupation: retired  Scientific laboratory technician  . Financial resource strain: Not on file  . Food insecurity  Worry: Not on file    Inability: Not on file  . Transportation needs    Medical: Not on file    Non-medical: Not on file  Tobacco Use  . Smoking status: Former Research scientist (life sciences)  . Smokeless tobacco: Never Used  Substance and Sexual Activity  . Alcohol use: Yes    Comment: socially -wine  . Drug use: No  . Sexual activity: Not on file  Lifestyle  . Physical activity    Days per week: Not on file    Minutes per session: Not on file  . Stress: Not on file  Relationships  . Social Herbalist on phone: Not on file    Gets together: Not on file    Attends religious service: Not on file    Active member of club or organization: Not on file    Attends meetings of clubs or organizations: Not on file    Relationship status: Not on file  Other Topics Concern  . Not on file  Social History Narrative   Household  -- lives by herself in a townhouse    Still drives    Emergency contact-- [name] (sister in law) lives in [place] --[phone]   She is her health POA    Outpatient Encounter Medications as of 12/05/2018  Medication Sig  . Calcium Carbonate (CALTRATE 600 PO) Take by mouth.    . denosumab (PROLIA) 60 MG/ML SOLN injection Inject 60 mg into the skin every 6 (six) months. Administer in upper arm, thigh, or abdomen  . Docusate Calcium (STOOL SOFTENER PO) Take by mouth.  . FIBER PO Take by mouth.    . fluticasone (FLONASE) 50 MCG/ACT nasal spray Place 2 sprays into both nostrils daily as needed.   . meloxicam (MOBIC) 15 MG tablet Take by mouth.  . Multiple Vitamins-Minerals (CENTRUM SILVER) CHEW Chew by mouth.    . pantoprazole (PROTONIX) 20 MG tablet Take 1 tablet (20 mg total) by mouth daily. (Patient taking differently: Take 20 mg by mouth every other day. )   No facility-administered encounter medications on file as of 12/05/2018.     Activities of Daily Living In your present state of health, do you have any difficulty performing the following activities: 12/05/2018  Hearing? N  Vision? N  Comment wears glasses.  Difficulty concentrating or making decisions? N  Walking or climbing stairs? N  Dressing or bathing? N  Doing errands, shopping? N  Preparing Food and eating ? N  Using the Toilet? N  In the past six months, have you accidently leaked urine? N  Do you have problems with loss of bowel control? N  Managing your Medications? N  Managing your Finances? N  Housekeeping or managing your Housekeeping? N  Some recent data might be hidden    Patient Care Team: Colon Branch, MD as PCP - General Roel Cluck, MD as Referring Physician (Ophthalmology) Maggie Schwalbe., MD (Family Medicine)    Assessment:   This is a routine wellness examination for [place]. Physical assessment deferred to PCP.  Exercise Activities and Dietary recommendations Current Exercise Habits: The  patient does not participate in regular exercise at present, Exercise limited by: None identified Diet (meal preparation, eat out, water intake, caffeinated beverages, dairy products, fruits and vegetables): in general, a "healthy" diet  , well balanced, on average, 3 meals per day Breakfast: cereal, fruit, coffee, juice Lunch: yogurt and saltines Dinner: meat and veggies.  Goals    .  DIET - INCREASE WATER INTAKE    . Increase physical activity     Do weightbearing exercises     . Maintain current healthy lifestyle. (pt-stated)       Fall Risk Fall Risk  12/05/2018 12/02/2017 05/04/2017 04/29/2016 12/02/2015  Falls in the past year? 0 No No No No  Number falls in past yr: - - - - -  Injury with Fall? - - - - -  Follow up - - - - -     Depression Screen PHQ 2/9 Scores 12/05/2018 12/02/2017 05/04/2017 04/29/2016  PHQ - 2 Score 0 0 0 0     Cognitive Function Ad8 score reviewed for issues:  Issues making decisions:no  Less interest in hobbies / activities:no  Repeats questions, stories (family complaining):no  Trouble using ordinary gadgets (microwave, computer, phone):no  Forgets the month or year: no  Mismanaging finances: no  Remembering appts:no  Daily problems with thinking and/or memory:no Ad8 score is=0     MMSE - Mini Mental State Exam 12/02/2017 12/01/2016  Orientation to time 5 5  Orientation to Place 5 5  Registration 3 3  Attention/ Calculation 5 5  Recall 3 2  Language- name 2 objects 2 2  Language- repeat 1 1  Language- follow 3 step command 3 3  Language- read & follow direction 1 1  Write a sentence 1 1  Copy design 1 1  Total score 30 29        Immunization History  Administered Date(s) Administered  . Influenza Split 02/29/2012  . Influenza Whole 04/17/2008  . Influenza, High Dose Seasonal PF 04/01/2015, 04/29/2016, 05/04/2017, 05/10/2018  . Influenza,inj,Quad PF,6+ Mos 03/27/2014  . Pneumococcal Conjugate-13 03/27/2014  . Pneumococcal  Polysaccharide-23 09/30/2004, 05/10/2018  . Td 03/03/2001  . Tdap 12/11/2010  . Zoster 11/29/2008   Screening Tests Health Maintenance  Topic Date Due  . INFLUENZA VACCINE  01/20/2019  . DEXA SCAN  05/26/2019  . TETANUS/TDAP  12/10/2020  . PNA vac Low Risk Adult  Completed      Plan:   See you next year!  Continue to eat heart healthy diet (full of fruits, vegetables, whole grains, lean protein, water--limit salt, fat, and sugar intake) and increase physical activity as tolerated.  Continue doing brain stimulating activities (puzzles, reading, adult coloring books, staying active) to keep memory sharp.     I have personally reviewed and noted the following in the patient's chart:   . Medical and social history . Use of alcohol, tobacco or illicit drugs  . Current medications and supplements . Functional ability and status . Nutritional status . Physical activity . Advanced directives . List of other physicians . Hospitalizations, surgeries, and ER visits in previous 12 months . Vitals . Screenings to include cognitive, depression, and falls . Referrals and appointments  In addition, I have reviewed and discussed with patient certain preventive protocols, quality metrics, and best practice recommendations. A written personalized care plan for preventive services as well as general preventive health recommendations were provided to patient.     [name] [place], [place]  12/05/2018  Kathlene November, MD

## 2018-12-05 ENCOUNTER — Encounter: Payer: Self-pay | Admitting: *Deleted

## 2018-12-05 ENCOUNTER — Other Ambulatory Visit: Payer: Self-pay

## 2018-12-05 ENCOUNTER — Ambulatory Visit (INDEPENDENT_AMBULATORY_CARE_PROVIDER_SITE_OTHER): Payer: Medicare HMO | Admitting: *Deleted

## 2018-12-05 DIAGNOSIS — Z0000 Encounter for general adult medical examination without abnormal findings: Secondary | ICD-10-CM | POA: Diagnosis not present

## 2018-12-05 NOTE — Patient Instructions (Signed)
See you next year!  Continue to eat heart healthy diet (full of fruits, vegetables, whole grains, lean protein, water--limit salt, fat, and sugar intake) and increase physical activity as tolerated.  Continue doing brain stimulating activities (puzzles, reading, adult coloring books, staying active) to keep memory sharp.    Kimberly Rodriguez , Thank you for taking time to come for your Medicare Wellness Visit. I appreciate your ongoing commitment to your health goals. Please review the following plan we discussed and let me know if I can assist you in the future.   These are the goals we discussed: Goals    . DIET - INCREASE WATER INTAKE    . Increase physical activity     Do weightbearing exercises     . Maintain current healthy lifestyle. (pt-stated)       This is a list of the screening recommended for you and due dates:  Health Maintenance  Topic Date Due  . Flu Shot  01/20/2019  . DEXA scan (bone density measurement)  05/26/2019  . Tetanus Vaccine  12/10/2020  . Pneumonia vaccines  Completed    Health Maintenance After Age 30 After age 88, you are at a higher risk for certain long-term diseases and infections as well as injuries from falls. Falls are a major cause of broken bones and head injuries in people who are older than age 38. Getting regular preventive care can help to keep you healthy and well. Preventive care includes getting regular testing and making lifestyle changes as recommended by your health care provider. Talk with your health care provider about:  Which screenings and tests you should have. A screening is a test that checks for a disease when you have no symptoms.  A diet and exercise plan that is right for you. What should I know about screenings and tests to prevent falls? Screening and testing are the best ways to find a health problem early. Early diagnosis and treatment give you the best chance of managing medical conditions that are common after age 64.  Certain conditions and lifestyle choices may make you more likely to have a fall. Your health care provider may recommend:  Regular vision checks. Poor vision and conditions such as cataracts can make you more likely to have a fall. If you wear glasses, make sure to get your prescription updated if your vision changes.  Medicine review. Work with your health care provider to regularly review all of the medicines you are taking, including over-the-counter medicines. Ask your health care provider about any side effects that may make you more likely to have a fall. Tell your health care provider if any medicines that you take make you feel dizzy or sleepy.  Osteoporosis screening. Osteoporosis is a condition that causes the bones to get weaker. This can make the bones weak and cause them to break more easily.  Blood pressure screening. Blood pressure changes and medicines to control blood pressure can make you feel dizzy.  Strength and balance checks. Your health care provider may recommend certain tests to check your strength and balance while standing, walking, or changing positions.  Foot health exam. Foot pain and numbness, as well as not wearing proper footwear, can make you more likely to have a fall.  Depression screening. You may be more likely to have a fall if you have a fear of falling, feel emotionally low, or feel unable to do activities that you used to do.  Alcohol use screening. Using too much alcohol can  affect your balance and may make you more likely to have a fall. What actions can I take to lower my risk of falls? General instructions  Talk with your health care provider about your risks for falling. Tell your health care provider if: ? You fall. Be sure to tell your health care provider about all falls, even ones that seem minor. ? You feel dizzy, sleepy, or off-balance.  Take over-the-counter and prescription medicines only as told by your health care provider. These  include any supplements.  Eat a healthy diet and maintain a healthy weight. A healthy diet includes low-fat dairy products, low-fat (lean) meats, and fiber from whole grains, beans, and lots of fruits and vegetables. Home safety  Remove any tripping hazards, such as rugs, cords, and clutter.  Install safety equipment such as grab bars in bathrooms and safety rails on stairs.  Keep rooms and walkways well-lit. Activity   Follow a regular exercise program to stay fit. This will help you maintain your balance. Ask your health care provider what types of exercise are appropriate for you.  If you need a cane or walker, use it as recommended by your health care provider.  Wear supportive shoes that have nonskid soles. Lifestyle  Do not drink alcohol if your health care provider tells you not to drink.  If you drink alcohol, limit how much you have: ? 0-1 drink a day for women. ? 0-2 drinks a day for men.  Be aware of how much alcohol is in your drink. In the [place] one drink equals one typical bottle of beer (12 oz), one-half glass of wine (5 oz), or one shot of hard liquor (1 oz).  Do not use any products that contain nicotine or tobacco, such as cigarettes and e-cigarettes. If you need help quitting, ask your health care provider. Summary  Having a healthy lifestyle and getting preventive care can help to protect your health and wellness after age 73.  Screening and testing are the best way to find a health problem early and help you avoid having a fall. Early diagnosis and treatment give you the best chance for managing medical conditions that are more common for people who are older than age 14.  Falls are a major cause of broken bones and head injuries in people who are older than age 68. Take precautions to prevent a fall at home.  Work with your health care provider to learn what changes you can make to improve your health and wellness and to prevent falls. This information is  not intended to replace advice given to you by your health care provider. Make sure you discuss any questions you have with your health care provider. Document Released: 04/20/2017 Document Revised: 04/20/2017 Document Reviewed: 04/20/2017 Elsevier Interactive Patient Education  2019 [employer].

## 2018-12-13 ENCOUNTER — Other Ambulatory Visit: Payer: Self-pay

## 2018-12-13 ENCOUNTER — Ambulatory Visit (INDEPENDENT_AMBULATORY_CARE_PROVIDER_SITE_OTHER): Payer: Medicare HMO | Admitting: Internal Medicine

## 2018-12-13 ENCOUNTER — Encounter: Payer: Self-pay | Admitting: Internal Medicine

## 2018-12-13 ENCOUNTER — Ambulatory Visit (HOSPITAL_BASED_OUTPATIENT_CLINIC_OR_DEPARTMENT_OTHER)
Admission: RE | Admit: 2018-12-13 | Discharge: 2018-12-13 | Disposition: A | Discharge status: Home / Self Care | Payer: Medicare HMO | Source: Ambulatory Visit | Attending: Internal Medicine | Admitting: Internal Medicine

## 2018-12-13 VITALS — BP 160/66 | HR 73 | Temp 98.1°F | Resp 16 | Ht 60.0 in | Wt 156.5 lb

## 2018-12-13 DIAGNOSIS — J984 Other disorders of lung: Secondary | ICD-10-CM

## 2018-12-13 DIAGNOSIS — M818 Other osteoporosis without current pathological fracture: Secondary | ICD-10-CM

## 2018-12-13 DIAGNOSIS — R030 Elevated blood-pressure reading, without diagnosis of hypertension: Secondary | ICD-10-CM

## 2018-12-13 DIAGNOSIS — J849 Interstitial pulmonary disease, unspecified: Secondary | ICD-10-CM | POA: Diagnosis not present

## 2018-12-13 DIAGNOSIS — M7989 Other specified soft tissue disorders: Secondary | ICD-10-CM | POA: Diagnosis not present

## 2018-12-13 NOTE — Progress Notes (Signed)
Pre visit review using our clinic review tool, if applicable. No additional management support is needed unless otherwise documented below in the visit note. 

## 2018-12-13 NOTE — Patient Instructions (Addendum)
   GO TO THE FRONT DESK Schedule your next appointment   for a physical exam by November 2020   Check the  blood pressure weekly Be sure your blood pressure is between 110/65 and  135/85. If it is consistently higher or lower, let me know   Watch your salt intake  Keep your legs elevated, if the left leg continues swelling by next week please call us.

## 2018-12-13 NOTE — Progress Notes (Signed)
Subjective:    Patient ID: Kimberly Rodriguez, female    DOB: 1926-11-02, 85 y.o.   MRN: [id number]  DOS:  12/13/2018 Type of visit - description: rov In general feeling well Due for a Prolia injection BP today slightly elevated, admits to increased salt intake lately. She still is trying to remain active and take occasional walks.   BP Readings from Last 3 Encounters:  12/13/18 (!) 160/66  09/18/18 116/62  05/10/18 (!) 144/68    Review of Systems No fever chills No chest pain no difficulty breathing No DOE No nausea, vomiting, diarrhea No cough  Past Medical History:  Diagnosis Date  . Anemia   . Blood transfusion without reported diagnosis   . Cataract   . Eustachian tube dysfunction    Right side, Dr. Laurance Flatten, ENT  . Microhematuria    s/p eval by urology 2008: U/S, CT, cysto w/ neg Bx (dx w/ a renal cyst, see reports)  . Osteoarthritis    h/o back pain, sees ortho s/p shots  . Osteoporosis     Past Surgical History:  Procedure Laterality Date  . ABDOMINAL HYSTERECTOMY     no oophorectomy per pt  . APPENDECTOMY    . COLONOSCOPY    . DILATION AND CURETTAGE OF UTERUS    . TONSILLECTOMY      Social History   Socioeconomic History  . Marital status: Widowed    Spouse name: Not on file  . Number of children: 0  . Years of education: Not on file  . Highest education level: Not on file  Occupational History  . Occupation: retired  Scientific laboratory technician  . Financial resource strain: Not on file  . Food insecurity    Worry: Not on file    Inability: Not on file  . Transportation needs    Medical: Not on file    Non-medical: Not on file  Tobacco Use  . Smoking status: Former Research scientist (life sciences)  . Smokeless tobacco: Never Used  Substance and Sexual Activity  . Alcohol use: Yes    Comment: socially -wine  . Drug use: No  . Sexual activity: Not on file  Lifestyle  . Physical activity    Days per week: Not on file    Minutes per session: Not on file  . Stress: Not on file   Relationships  . Social Herbalist on phone: Not on file    Gets together: Not on file    Attends religious service: Not on file    Active member of club or organization: Not on file    Attends meetings of clubs or organizations: Not on file    Relationship status: Not on file  . Intimate partner violence    Fear of current or ex partner: Not on file    Emotionally abused: Not on file    Physically abused: Not on file    Forced sexual activity: Not on file  Other Topics Concern  . Not on file  Social History Narrative   Household -- lives by herself in a townhouse    Still drives    Emergency contact-- [name] (sister in law) lives in [place] --[phone]   She is her health POA      Allergies as of 12/13/2018      Reactions   Astelin [azelastine Hcl] Palpitations      Medication List       Accurate as of December 13, 2018 11:59 PM. If you have  any questions, ask your nurse or doctor.        CALTRATE 600 PO Take by mouth.   Centrum Silver Chew Chew by mouth.   denosumab 60 MG/ML Soln injection Commonly known as: PROLIA Inject 60 mg into the skin every 6 (six) months. Administer in upper arm, thigh, or abdomen   FIBER PO Take by mouth.   fluticasone 50 MCG/ACT nasal spray Commonly known as: FLONASE Place 2 sprays into both nostrils daily as needed.   meloxicam 15 MG tablet Commonly known as: MOBIC Take by mouth.   pantoprazole 20 MG tablet Commonly known as: Protonix Take 1 tablet (20 mg total) by mouth daily. What changed: when to take this   STOOL SOFTENER PO Take by mouth.           Objective:   Physical Exam BP (!) 160/66 (BP Location: Left Arm, Patient Position: Sitting, Cuff Size: Small)   Pulse 73   Temp 98.1 F (36.7 C) (Oral)   Resp 16   Ht 5' (1.524 m)   Wt 156 lb 8 oz (71 kg)   SpO2 98%   BMI 30.56 kg/m  General:   Well developed, NAD, BMI noted. HEENT:  Normocephalic . Face symmetric, atraumatic Lungs:  CTA B Normal  respiratory effort, no intercostal retractions, no accessory muscle use. Heart: RRR,  no murmur.  Lower extremities: I noticed some peri-ankle edema on the left, the left calf is also slightly larger in circumference by about 1.5 cm.  It is minimally  tender. Skin: Not pale. Not jaundice Neurologic:  alert & oriented X3.  Speech normal, gait appropriate for age and unassisted Psych--  Cognition and judgment appear intact.  Cooperative with normal attention span and concentration.  Behavior appropriate. No anxious or depressed appearing.      Assessment     Assessment interstitial lung dz per chest x-ray 08-2014, PFTs 08-2014: minimal obstruction MSK: --DJD --Neck pain --Back pain: h/o acupuncture before Osteoporosis:  T score 04-2015 -3.1, prolia #2  11-2015  T score -3.4 (10/2016), saw Dr. Loanne Drilling, work-up for secondary etiologies negative, rx Prolia 03/2018 Microhematuria  s/p eval by urology 2008: U/S, CT, cysto w/ neg Bx (dx w/ a renal cyst, see reports) ET dysfuntion saw ENT Dr Laurance Flatten 2016 Anemia, saw GI: 06-2016: Cscope: polyps, tubular adenoma; EGD: cameron lesions, no Bx H/o Anxiety   PLAN Interstitial lung disease: Essentially asymptomatic Osteoporosis: Due for a Prolia, we are arranging that, consider bone density test next year. Anemia: Last CBC satisfactory Elevated BP: BP today 160/66, admits to recent increase in salt intake.  Other BPs at this office normal, BPs normal at the dental office per patient.  Recommend to monitor BPs at home.  See AVS Lower extremity edema: On the left, will get a [place], leg elevation RTC 04/2019 CPX

## 2018-12-14 ENCOUNTER — Telehealth: Payer: Self-pay

## 2018-12-14 NOTE — Telephone Encounter (Signed)
Advise patient ultrasound is negative, no clots.  Good result (I released the results yesterday)

## 2018-12-14 NOTE — Telephone Encounter (Signed)
Copied from [place] [id number]. Topic: General - Other >> Dec 14, 2018 11:14 AM Yvette Rack wrote: Reason for CRM: Pt called in for ultrasound results. Pt requests call back. Cb# [phone]

## 2018-12-14 NOTE — Telephone Encounter (Signed)
Spoke w/ Pt- informed of results.  

## 2018-12-14 NOTE — Assessment & Plan Note (Signed)
Interstitial lung disease: Essentially asymptomatic Osteoporosis: Due for a Prolia, we are arranging that, consider bone density test next year. Anemia: Last CBC satisfactory Elevated BP: BP today 160/66, admits to recent increase in salt intake.  Other BPs at this office normal, BPs normal at the dental office per patient.  Recommend to monitor BPs at home.  See AVS Lower extremity edema: On the left, will get a [place], leg elevation RTC 04/2019 CPX

## 2018-12-14 NOTE — Telephone Encounter (Signed)
Please advise 

## 2018-12-21 ENCOUNTER — Other Ambulatory Visit: Payer: Self-pay | Admitting: Internal Medicine

## 2018-12-21 MED ORDER — PANTOPRAZOLE SODIUM 20 MG PO TBEC: 20.0000 mg | DELAYED_RELEASE_TABLET | Freq: Every day | ORAL | 3 refills | Status: DC

## 2018-12-21 NOTE — Telephone Encounter (Signed)
Copied from [place] [id number]. Topic: Quick Communication - Rx Refill/Question >> Dec 21, 2018 11:43 AM Leward Quan A wrote: Medication: pantoprazole (PROTONIX) 20 MG tablet  Has the patient contacted their pharmacy? Yes.   (Agent: If no, request that the patient contact the pharmacy for the refill.) (Agent: If yes, when and what did the pharmacy advise?)  Preferred Pharmacy (with phone number or street name): [place], [place] [phone] (Phone) [phone] (Fax)    Agent: Please be advised that RX refills may take up to 3 business days. We ask that you follow-up with your pharmacy.

## 2018-12-21 NOTE — Telephone Encounter (Signed)
Rx sent 

## 2019-01-20 DIAGNOSIS — I10 Essential (primary) hypertension: Secondary | ICD-10-CM | POA: Diagnosis not present

## 2019-01-20 DIAGNOSIS — R600 Localized edema: Secondary | ICD-10-CM | POA: Diagnosis not present

## 2019-01-20 DIAGNOSIS — S82892A Other fracture of left lower leg, initial encounter for closed fracture: Secondary | ICD-10-CM

## 2019-01-20 DIAGNOSIS — Y998 Other external cause status: Secondary | ICD-10-CM | POA: Diagnosis not present

## 2019-01-20 DIAGNOSIS — W010XXA Fall on same level from slipping, tripping and stumbling without subsequent striking against object, initial encounter: Secondary | ICD-10-CM | POA: Diagnosis not present

## 2019-01-20 DIAGNOSIS — S0990XA Unspecified injury of head, initial encounter: Secondary | ICD-10-CM | POA: Diagnosis not present

## 2019-01-20 DIAGNOSIS — R609 Edema, unspecified: Secondary | ICD-10-CM | POA: Diagnosis not present

## 2019-01-20 DIAGNOSIS — R52 Pain, unspecified: Secondary | ICD-10-CM | POA: Diagnosis not present

## 2019-01-20 DIAGNOSIS — S8262XA Displaced fracture of lateral malleolus of left fibula, initial encounter for closed fracture: Secondary | ICD-10-CM | POA: Diagnosis not present

## 2019-01-20 DIAGNOSIS — S82402A Unspecified fracture of shaft of left fibula, initial encounter for closed fracture: Secondary | ICD-10-CM | POA: Diagnosis not present

## 2019-01-20 DIAGNOSIS — W19XXXA Unspecified fall, initial encounter: Secondary | ICD-10-CM | POA: Diagnosis not present

## 2019-01-20 DIAGNOSIS — S50312A Abrasion of left elbow, initial encounter: Secondary | ICD-10-CM | POA: Diagnosis not present

## 2019-01-20 HISTORY — DX: Other fracture of left lower leg, initial encounter for closed fracture: S82.892A

## 2019-01-23 DIAGNOSIS — M25572 Pain in left ankle and joints of left foot: Secondary | ICD-10-CM | POA: Diagnosis not present

## 2019-01-23 DIAGNOSIS — S8265XA Nondisplaced fracture of lateral malleolus of left fibula, initial encounter for closed fracture: Secondary | ICD-10-CM | POA: Insufficient documentation

## 2019-01-24 ENCOUNTER — Telehealth: Payer: Self-pay

## 2019-01-24 DIAGNOSIS — S8265XA Nondisplaced fracture of lateral malleolus of left fibula, initial encounter for closed fracture: Secondary | ICD-10-CM

## 2019-01-24 NOTE — Telephone Encounter (Signed)
ED notes in Care Everywhere.

## 2019-01-24 NOTE — Telephone Encounter (Signed)
Order placed

## 2019-01-24 NOTE — Telephone Encounter (Signed)
Copied from [place] [id number]. Topic: Referral - Request for Referral >> Jan 24, 2019 10:42 AM Alanda Slim E wrote: Has patient seen PCP for this complaint?  *If NO, is insurance requiring patient see PCP for this issue before PCP can refer them? Referral for which specialty: Home health aid Preferred provider/office:  Reason for referral: Pt fell on Saturday and fractured her ankle and is in a cast with a boot and on krutches. Pt is asking for an aid for the next couple of weeks / please advise

## 2019-01-24 NOTE — Telephone Encounter (Signed)
Please enter the referral

## 2019-01-26 NOTE — Telephone Encounter (Signed)
Patient requesting call from [facility] regarding referral.

## 2019-01-26 NOTE — Telephone Encounter (Signed)
Received message below:   Lisabeth Devoid, [place], [place],   I spoke to Ms. Debarge and explained that we could not send an aide out without another discipline such as Physical therapy. She stated that she did not need therapy right now because she is in a cast and I agree. I also explained that she would have to pay for an aide privately until the cast came off and we could get therapy ordered.  I did tell her to call [facility] because sometimes they can send meals temporarily depending on the plan.   We appreciate the referral and if there is another Medicare patient we can assist with, please let us know

## 2019-01-29 NOTE — Telephone Encounter (Signed)
Spoke w/ Pt- tried answering her questions as best as I could- informed that home health nursing would not give the baths, cooking etc- she would have to call [employer] to see if they would cover a nurse aide if she hired someone. Pt verbalized understanding.

## 2019-01-29 NOTE — Telephone Encounter (Signed)
LMOM informing again of Lori's message from home health agency.

## 2019-01-29 NOTE — Telephone Encounter (Signed)
Patient called in stating she thought she could still have an aide, that's why she provided office with 4 agencies that would cover. Patient is requesting a call back from [facility] to discuss further as to what the next step should be. Please advise and call back.

## 2019-02-13 DIAGNOSIS — S8265XD Nondisplaced fracture of lateral malleolus of left fibula, subsequent encounter for closed fracture with routine healing: Secondary | ICD-10-CM | POA: Diagnosis not present

## 2019-02-13 DIAGNOSIS — M25572 Pain in left ankle and joints of left foot: Secondary | ICD-10-CM | POA: Diagnosis not present

## 2019-03-16 ENCOUNTER — Other Ambulatory Visit: Payer: Self-pay

## 2019-03-16 ENCOUNTER — Ambulatory Visit (INDEPENDENT_AMBULATORY_CARE_PROVIDER_SITE_OTHER): Payer: Medicare HMO

## 2019-03-16 DIAGNOSIS — M7989 Other specified soft tissue disorders: Secondary | ICD-10-CM | POA: Diagnosis not present

## 2019-03-16 DIAGNOSIS — S8265XD Nondisplaced fracture of lateral malleolus of left fibula, subsequent encounter for closed fracture with routine healing: Secondary | ICD-10-CM | POA: Diagnosis not present

## 2019-03-16 DIAGNOSIS — Z23 Encounter for immunization: Secondary | ICD-10-CM | POA: Diagnosis not present

## 2019-03-16 DIAGNOSIS — R7989 Other specified abnormal findings of blood chemistry: Secondary | ICD-10-CM | POA: Diagnosis not present

## 2019-03-16 NOTE — Progress Notes (Signed)
Here for flu shot

## 2019-04-05 DIAGNOSIS — D485 Neoplasm of uncertain behavior of skin: Secondary | ICD-10-CM | POA: Diagnosis not present

## 2019-04-05 DIAGNOSIS — D692 Other nonthrombocytopenic purpura: Secondary | ICD-10-CM | POA: Diagnosis not present

## 2019-04-05 DIAGNOSIS — L821 Other seborrheic keratosis: Secondary | ICD-10-CM | POA: Diagnosis not present

## 2019-04-05 DIAGNOSIS — L820 Inflamed seborrheic keratosis: Secondary | ICD-10-CM | POA: Diagnosis not present

## 2019-05-01 DIAGNOSIS — S8265XD Nondisplaced fracture of lateral malleolus of left fibula, subsequent encounter for closed fracture with routine healing: Secondary | ICD-10-CM | POA: Diagnosis not present

## 2019-05-14 ENCOUNTER — Other Ambulatory Visit: Payer: Self-pay

## 2019-05-15 ENCOUNTER — Encounter: Payer: Self-pay | Admitting: Internal Medicine

## 2019-05-15 ENCOUNTER — Ambulatory Visit (INDEPENDENT_AMBULATORY_CARE_PROVIDER_SITE_OTHER): Payer: Medicare HMO | Admitting: Internal Medicine

## 2019-05-15 ENCOUNTER — Other Ambulatory Visit: Payer: Self-pay

## 2019-05-15 VITALS — BP 167/83 | HR 78 | Temp 96.8°F | Resp 18 | Ht 60.0 in | Wt 156.1 lb

## 2019-05-15 DIAGNOSIS — Z0000 Encounter for general adult medical examination without abnormal findings: Secondary | ICD-10-CM | POA: Diagnosis not present

## 2019-05-15 DIAGNOSIS — S82892S Other fracture of left lower leg, sequela: Secondary | ICD-10-CM

## 2019-05-15 DIAGNOSIS — M810 Age-related osteoporosis without current pathological fracture: Secondary | ICD-10-CM

## 2019-05-15 DIAGNOSIS — Z8781 Personal history of (healed) traumatic fracture: Secondary | ICD-10-CM

## 2019-05-15 LAB — CBC WITH DIFFERENTIAL/PLATELET
Basophils Absolute: 0 10*3/uL (ref 0.0–0.1)
Basophils Relative: 0.7 % (ref 0.0–3.0)
Eosinophils Absolute: 0.1 10*3/uL (ref 0.0–0.7)
Eosinophils Relative: 1.6 % (ref 0.0–5.0)
HCT: 39.9 % (ref 36.0–46.0)
Hemoglobin: 13.2 g/dL (ref 12.0–15.0)
Lymphocytes Relative: 15.8 % (ref 12.0–46.0)
Lymphs Abs: 1 10*3/uL (ref 0.7–4.0)
MCHC: 33 g/dL (ref 30.0–36.0)
MCV: 93.3 fl (ref 78.0–100.0)
Monocytes Absolute: 0.8 10*3/uL (ref 0.1–1.0)
Monocytes Relative: 12.8 % — ABNORMAL HIGH (ref 3.0–12.0)
Neutro Abs: 4.5 10*3/uL (ref 1.4–7.7)
Neutrophils Relative %: 69.1 % (ref 43.0–77.0)
Platelets: 193 10*3/uL (ref 150.0–400.0)
RBC: 4.28 Mil/uL (ref 3.87–5.11)
RDW: 15.2 % (ref 11.5–15.5)
WBC: 6.6 10*3/uL (ref 4.0–10.5)

## 2019-05-15 LAB — LIPID PANEL
Cholesterol: 210 mg/dL — ABNORMAL HIGH (ref 0–200)
HDL: 75.8 mg/dL (ref >39.00)
LDL Cholesterol: 118 mg/dL — ABNORMAL HIGH (ref 0–99)
NonHDL: 134.49
Total CHOL/HDL Ratio: 3
Triglycerides: 82 mg/dL (ref 0.0–149.0)
VLDL: 16.4 mg/dL (ref 0.0–40.0)

## 2019-05-15 LAB — COMPREHENSIVE METABOLIC PANEL
ALT: 14 U/L (ref 0–35)
AST: 18 U/L (ref 0–37)
Albumin: 3.7 g/dL (ref 3.5–5.2)
Alkaline Phosphatase: 78 U/L (ref 39–117)
BUN: 17 mg/dL (ref 6–23)
CO2: 32 mEq/L (ref 19–32)
Calcium: 9.4 mg/dL (ref 8.4–10.5)
Chloride: 101 mEq/L (ref 96–112)
Creatinine, Ser: 0.7 mg/dL (ref 0.40–1.20)
GFR: 78.57 mL/min (ref >60.00)
Glucose, Bld: 86 mg/dL (ref 70–99)
Potassium: 4.4 mEq/L (ref 3.5–5.1)
Sodium: 137 mEq/L (ref 135–145)
Total Bilirubin: 0.5 mg/dL (ref 0.2–1.2)
Total Protein: 6.2 g/dL (ref 6.0–8.3)

## 2019-05-15 MED ORDER — MELOXICAM 7.5 MG PO TABS: 7.5000 mg | ORAL_TABLET | Freq: Every day | ORAL | 3 refills | Status: DC | PRN

## 2019-05-15 MED ORDER — DENOSUMAB 60 MG/ML ~~LOC~~ SOSY
60.0000 mg | PREFILLED_SYRINGE | Freq: Once | SUBCUTANEOUS | Status: AC
  Administered 2019-05-15: 11:00:00 60 mg via SUBCUTANEOUS

## 2019-05-15 NOTE — Progress Notes (Signed)
Subjective:    Patient ID: Kimberly Rodriguez, female    DOB: 04-11-1927, 85 y.o.   MRN: [id number]  DOS:  05/15/2019 Type of visit - description: CPX Had ankle fracture 01-2019, was treated conservatively, fortunately she is better.   BP Readings from Last 3 Encounters:  05/15/19 (!) 167/83  12/13/18 (!) 160/66  09/18/18 116/62     Review of Systems  Other than above, a 14 point review of systems is negative    Past Medical History:  Diagnosis Date  . Anemia   . Blood transfusion without reported diagnosis   . Cataract   . Closed left ankle fracture 01/2019   no surgery  . Eustachian tube dysfunction    Right side, Dr. Laurance Flatten, ENT  . Microhematuria    s/p eval by urology 2008: U/S, CT, cysto w/ neg Bx (dx w/ a renal cyst, see reports)  . Osteoarthritis    h/o back pain, sees ortho s/p shots  . Osteoporosis     Past Surgical History:  Procedure Laterality Date  . ABDOMINAL HYSTERECTOMY     no oophorectomy per pt  . APPENDECTOMY    . COLONOSCOPY    . DILATION AND CURETTAGE OF UTERUS    . TONSILLECTOMY      Social History   Socioeconomic History  . Marital status: Widowed    Spouse name: Not on file  . Number of children: 0  . Years of education: Not on file  . Highest education level: Not on file  Occupational History  . Occupation: retired  Scientific laboratory technician  . Financial resource strain: Not on file  . Food insecurity    Worry: Not on file    Inability: Not on file  . Transportation needs    Medical: Not on file    Non-medical: Not on file  Tobacco Use  . Smoking status: Former Research scientist (life sciences)  . Smokeless tobacco: Never Used  Substance and Sexual Activity  . Alcohol use: Yes    Comment: socially -wine  . Drug use: No  . Sexual activity: Not on file  Lifestyle  . Physical activity    Days per week: Not on file    Minutes per session: Not on file  . Stress: Not on file  Relationships  . Social Herbalist on phone: Not on file    Gets together:  Not on file    Attends religious service: Not on file    Active member of club or organization: Not on file    Attends meetings of clubs or organizations: Not on file    Relationship status: Not on file  . Intimate partner violence    Fear of current or ex partner: Not on file    Emotionally abused: Not on file    Physically abused: Not on file    Forced sexual activity: Not on file  Other Topics Concern  . Not on file  Social History Narrative   Household -- lives by herself in a townhouse    Still drives    Emergency contact-- [name] (sister in law) lives in [place] --[phone]   She is her health POA     Family History  Problem Relation Age of Onset  . Heart disease Father   . Prostate cancer Father   . Heart disease Mother   . Colon cancer Neg Hx   . Breast cancer Neg Hx   . Diabetes Neg Hx   . Osteoporosis Neg Hx  Allergies as of 05/15/2019      Reactions   Astelin [azelastine Hcl] Palpitations      Medication List       Accurate as of May 15, 2019 11:59 PM. If you have any questions, ask your nurse or doctor.        CALTRATE 600 PO Take by mouth.   Centrum Silver Chew Chew by mouth.   denosumab 60 MG/ML Soln injection Commonly known as: PROLIA Inject 60 mg into the skin every 6 (six) months. Administer in upper arm, thigh, or abdomen   FIBER PO Take by mouth.   fluticasone 50 MCG/ACT nasal spray Commonly known as: FLONASE Place 2 sprays into both nostrils daily as needed.   meloxicam 7.5 MG tablet Commonly known as: MOBIC Take 1 tablet (7.5 mg total) by mouth daily as needed for pain. What changed:   medication strength  how much to take  when to take this  reasons to take this Changed by: Kathlene November, MD   pantoprazole 20 MG tablet Commonly known as: Protonix Take 1 tablet (20 mg total) by mouth daily before breakfast.   STOOL SOFTENER PO Take by mouth.           Objective:   Physical Exam BP (!) 167/83 (BP Location:  Left Arm, Patient Position: Sitting, Cuff Size: Small)   Pulse 78   Temp (!) 96.8 F (36 C) (Temporal)   Resp 18   Ht 5' (1.524 m)   Wt 156 lb 2 oz (70.8 kg)   SpO2 98%   BMI 30.49 kg/m  General: Well developed, NAD, BMI noted Neck: No  thyromegaly  HEENT:  Normocephalic . Face symmetric, atraumatic Lungs:  CTA B Normal respiratory effort, no intercostal retractions, no accessory muscle use. Heart: RRR,  no murmur.  No pretibial edema bilaterally  Abdomen:  Not distended, soft, non-tender. No rebound or rigidity.   Skin: Exposed areas without rash. Not pale. Not jaundice MSK: Left ankle with no swelling, range of motion slightly decreased compared to the right Neurologic:  alert & oriented X3.  Speech normal, gait appropriate for age and unassisted Strength symmetric and appropriate for age.  Psych: Cognition and judgment appear intact.  Cooperative with normal attention span and concentration.  Behavior appropriate. No anxious or depressed appearing.     Assessment     Assessment interstitial lung dz per chest x-ray 08-2014, PFTs 08-2014: minimal obstruction MSK: --DJD --Neck pain --Back pain: h/o acupuncture before Osteoporosis:  T score 04-2015 -3.1, prolia #2  11-2015  T score -3.4 (10/2016), saw Dr. Loanne Drilling, work-up for secondary etiologies negative, rx Prolia 03/2018 Microhematuria  s/p eval by urology 2008: U/S, CT, cysto w/ neg Bx (dx w/ a renal cyst, see reports) ET dysfuntion saw ENT Dr Laurance Flatten 2016 Anemia, saw GI: 06-2016: Cscope: polyps, tubular adenoma; EGD: cameron lesions, no Bx H/o Anxiety   PLAN Here for CPX Left ankle fracture 01-2019: She is recuperated, able to walk without assistance, driving, independent. Osteoporosis: She was hesitant to take Prolia due to side effects, I feel benefits outweighed risks, she eventually agreed to take it, shot provided today. DJD: On meloxicam 15 mg most days, denies GI side effects.  Recommend to decrease meloxicam  to 7.5 mg as needed only, okay to use Tylenol, monitor GI symptoms, continue PPIs. Elevated BP: BP in the 160s, recommend ambulatory BPs, call in 4 weeks, reassess in 4 months.  Consider medications RTC 4 months   This visit occurred during the  SARS-CoV-2 public health emergency.  Safety protocols were in place, including screening questions prior to the visit, additional usage of staff PPE, and extensive cleaning of exam room while observing appropriate contact time as indicated for disinfecting solutions.

## 2019-05-15 NOTE — Patient Instructions (Addendum)
GO TO THE LAB : Get the blood work     GO TO THE FRONT DESK Schedule your next appointment  For a check up in 3-4 months     Decrease meloxicam to 7.5 mg once a day as needed  Tylenol  500 mg OTC 2 tabs a day every 8 hours as needed for pain   Check the  blood pressure 2  times a week BP GOAL is between 110/65 and  135/85. Call in 4 weeks with your blood pressure readings

## 2019-05-15 NOTE — Progress Notes (Signed)
Pre visit review using our clinic review tool, if applicable. No additional management support is needed unless otherwise documented below in the visit note. 

## 2019-05-16 NOTE — Assessment & Plan Note (Signed)
Here for CPX Left ankle fracture 01-2019: She is recuperated, able to walk without assistance, driving, independent. Osteoporosis: She was hesitant to take Prolia due to side effects, I feel benefits outweighed risks, she eventually agreed to take it, shot provided today. DJD: On meloxicam 15 mg most days, denies GI side effects.  Recommend to decrease meloxicam to 7.5 mg as needed only, okay to use Tylenol, monitor GI symptoms, continue PPIs. Elevated BP: BP in the 160s, recommend ambulatory BPs, call in 4 weeks, reassess in 4 months.  Consider medications RTC 4 months

## 2019-05-16 NOTE — Assessment & Plan Note (Signed)
-  Td  2012, pneumonia shot, 2006 and 04/2018; prevnar:2015; zostavax 2010 - shingrix at her pharmacy [facility]) per pt   - had a flu shot   -No further cervical ,Breast or colon cancer screening (did have a colonoscopy 06-2016 D/T anemia) -Diet and exercise discussed  -Labs: CMP, FLP, CBC

## 2019-06-07 DIAGNOSIS — L84 Corns and callosities: Secondary | ICD-10-CM | POA: Diagnosis not present

## 2019-06-07 DIAGNOSIS — M79671 Pain in right foot: Secondary | ICD-10-CM | POA: Diagnosis not present

## 2019-06-07 DIAGNOSIS — B351 Tinea unguium: Secondary | ICD-10-CM | POA: Diagnosis not present

## 2019-06-07 DIAGNOSIS — M79672 Pain in left foot: Secondary | ICD-10-CM | POA: Diagnosis not present

## 2019-06-30 ENCOUNTER — Ambulatory Visit: Payer: Medicare Other | Attending: Internal Medicine

## 2019-06-30 DIAGNOSIS — Z23 Encounter for immunization: Secondary | ICD-10-CM

## 2019-06-30 NOTE — Progress Notes (Signed)
   Covid-19 Vaccination Clinic  Name:  Kimberly Rodriguez    MRN: [id number] DOB: 1926-09-23  06/30/2019  [name] was observed post Covid-19 immunization for 15 minutes without incidence. She was provided with Vaccine Information Sheet and instruction to access the V-Safe system.   [name] was instructed to call 911 with any severe reactions post vaccine: Marland Kitchen Difficulty breathing  . Swelling of your face and throat  . A fast heartbeat  . A bad rash all over your body  . Dizziness and weakness    Immunizations Administered    Name Date Dose VIS Date Route   Pfizer COVID-19 Vaccine 06/30/2019 11:38 AM 0.3 mL 06/01/2019 Intramuscular   Manufacturer: [employer], [employer]   Lot: [id number]   [place]: [id number]

## 2019-07-18 DIAGNOSIS — H43393 Other vitreous opacities, bilateral: Secondary | ICD-10-CM | POA: Diagnosis not present

## 2019-07-18 DIAGNOSIS — H52203 Unspecified astigmatism, bilateral: Secondary | ICD-10-CM | POA: Diagnosis not present

## 2019-07-18 DIAGNOSIS — H43813 Vitreous degeneration, bilateral: Secondary | ICD-10-CM | POA: Diagnosis not present

## 2019-07-18 DIAGNOSIS — H25012 Cortical age-related cataract, left eye: Secondary | ICD-10-CM | POA: Diagnosis not present

## 2019-07-18 DIAGNOSIS — H18513 Endothelial corneal dystrophy, bilateral: Secondary | ICD-10-CM | POA: Diagnosis not present

## 2019-07-18 DIAGNOSIS — H5203 Hypermetropia, bilateral: Secondary | ICD-10-CM | POA: Diagnosis not present

## 2019-07-18 DIAGNOSIS — H18413 Arcus senilis, bilateral: Secondary | ICD-10-CM | POA: Diagnosis not present

## 2019-07-18 DIAGNOSIS — H2512 Age-related nuclear cataract, left eye: Secondary | ICD-10-CM | POA: Diagnosis not present

## 2019-07-18 DIAGNOSIS — H524 Presbyopia: Secondary | ICD-10-CM | POA: Diagnosis not present

## 2019-07-19 DIAGNOSIS — H524 Presbyopia: Secondary | ICD-10-CM | POA: Diagnosis not present

## 2019-07-19 DIAGNOSIS — H52223 Regular astigmatism, bilateral: Secondary | ICD-10-CM | POA: Diagnosis not present

## 2019-07-20 ENCOUNTER — Ambulatory Visit: Payer: Medicare HMO

## 2019-07-21 ENCOUNTER — Ambulatory Visit: Payer: Medicare Other | Attending: Internal Medicine

## 2019-07-21 DIAGNOSIS — Z23 Encounter for immunization: Secondary | ICD-10-CM | POA: Insufficient documentation

## 2019-07-21 NOTE — Progress Notes (Signed)
   Covid-19 Vaccination Clinic  Name:  Kimberly Rodriguez    MRN: [id number] DOB: 12-Aug-1926  07/21/2019  [name] was observed post Covid-19 immunization for 15 minutes without incidence. She was provided with Vaccine Information Sheet and instruction to access the V-Safe system.   [name] was instructed to call 911 with any severe reactions post vaccine: Marland Kitchen Difficulty breathing  . Swelling of your face and throat  . A fast heartbeat  . A bad rash all over your body  . Dizziness and weakness    Immunizations Administered    Name Date Dose VIS Date Route   Pfizer COVID-19 Vaccine 07/21/2019 11:17 AM 0.3 mL 06/01/2019 Intramuscular   Manufacturer: [place]   Lot: [id number]   [place]: [id number]

## 2019-09-11 ENCOUNTER — Other Ambulatory Visit: Payer: Self-pay

## 2019-09-12 ENCOUNTER — Ambulatory Visit (INDEPENDENT_AMBULATORY_CARE_PROVIDER_SITE_OTHER): Payer: Medicare HMO | Admitting: Internal Medicine

## 2019-09-12 ENCOUNTER — Other Ambulatory Visit: Payer: Self-pay

## 2019-09-12 ENCOUNTER — Encounter: Payer: Self-pay | Admitting: Internal Medicine

## 2019-09-12 VITALS — BP 146/86 | HR 106 | Temp 97.3°F | Resp 18 | Ht 60.0 in | Wt 160.5 lb

## 2019-09-12 DIAGNOSIS — R011 Cardiac murmur, unspecified: Secondary | ICD-10-CM

## 2019-09-12 DIAGNOSIS — R030 Elevated blood-pressure reading, without diagnosis of hypertension: Secondary | ICD-10-CM

## 2019-09-12 DIAGNOSIS — R000 Tachycardia, unspecified: Secondary | ICD-10-CM | POA: Diagnosis not present

## 2019-09-12 DIAGNOSIS — M1990 Unspecified osteoarthritis, unspecified site: Secondary | ICD-10-CM | POA: Diagnosis not present

## 2019-09-12 NOTE — Progress Notes (Signed)
Pre visit review using our clinic review tool, if applicable. No additional management support is needed unless otherwise documented below in the visit note. 

## 2019-09-12 NOTE — Progress Notes (Signed)
Subjective:    Patient ID: Kimberly Rodriguez, female    DOB: [birth year], 86 y.o.   MRN: [id number]  DOS:  09/12/2019 Type of visit - description: Routine visit Since the last office visit she is doing well. Still using meloxicam at a reduced dose, no apparent side effects. She was noted to be tachycardic upon arrival.  Denies chest pain, difficulty breathing, palpitations.  No lower extremity edema    Review of Systems Denies any GI side effects from NSAIDs: No nausea, vomiting, diarrhea.  No blood in the stools, no epigastric pain  Past Medical History:  Diagnosis Date  . Anemia   . Blood transfusion without reported diagnosis   . Cataract   . Closed left ankle fracture 01/2019   no surgery  . Eustachian tube dysfunction    Right side, Dr. Laurance Flatten, ENT  . Microhematuria    s/p eval by urology 2008: U/S, CT, cysto w/ neg Bx (dx w/ a renal cyst, see reports)  . Osteoarthritis    h/o back pain, sees ortho s/p shots  . Osteoporosis     Past Surgical History:  Procedure Laterality Date  . ABDOMINAL HYSTERECTOMY     no oophorectomy per pt  . APPENDECTOMY    . COLONOSCOPY    . DILATION AND CURETTAGE OF UTERUS    . TONSILLECTOMY      Allergies as of 09/12/2019      Reactions   Astelin [azelastine Hcl] Palpitations      Medication List       Accurate as of September 12, 2019 11:59 PM. If you have any questions, ask your nurse or doctor.        CALTRATE 600 PO Take by mouth.   Centrum Silver Chew Chew by mouth.   denosumab 60 MG/ML Soln injection Commonly known as: PROLIA Inject 60 mg into the skin every 6 (six) months. Administer in upper arm, thigh, or abdomen   FIBER PO Take by mouth.   fluticasone 50 MCG/ACT nasal spray Commonly known as: FLONASE Place 2 sprays into both nostrils daily as needed.   meloxicam 7.5 MG tablet Commonly known as: MOBIC Take 1 tablet (7.5 mg total) by mouth daily as needed for pain.   pantoprazole 20 MG tablet Commonly known as:  Protonix Take 1 tablet (20 mg total) by mouth daily before breakfast.   STOOL SOFTENER PO Take by mouth.          Objective:   Physical Exam BP (!) 146/86 (BP Location: Left Arm, Patient Position: Sitting, Cuff Size: Small)   Pulse (!) 106   Temp (!) 97.3 F (36.3 C) (Temporal)   Resp 18   Ht 5' (1.524 m)   Wt 160 lb 8 oz (72.8 kg)   SpO2 96%   BMI 31.35 kg/m  General:   Well developed, NAD, BMI noted. HEENT:  Normocephalic . Face symmetric, atraumatic Lungs:  CTA B Normal respiratory effort, no intercostal retractions, no accessory muscle use. Heart: No tachycardic, irregular?  Frequent skipped beats.  2/6 systolic murmur, soft. Lower extremities: no pretibial edema bilaterally  Skin: Not pale. Not jaundice Neurologic:  alert & oriented X3.  Speech normal, gait appropriate for age and unassisted Psych--  Cognition and judgment appear intact.  Cooperative with normal attention span and concentration.  Behavior appropriate. No anxious or depressed appearing.      Assessment         Assessment interstitial lung dz per chest x-ray 08-2014, PFTs 08-2014: minimal obstruction  MSK: --DJD --Neck pain --Back pain: h/o acupuncture before Osteoporosis:  T score 04-2015 -3.1, prolia #2  11-2015  T score -3.4 (10/2016), saw Dr. Loanne Drilling, work-up for secondary etiologies negative, rx Prolia 03/2018 Microhematuria  s/p eval by urology 2008: U/S, CT, cysto w/ neg Bx (dx w/ a renal cyst, see reports) ET dysfuntion saw ENT Dr Laurance Flatten 2016 Anemia, saw GI: 06-2016: Cscope: polyps, tubular adenoma; EGD: cameron lesions, no Bx H/o Anxiety   PLAN DJD: Since the last visit, decrease meloxicam to 7.5 mg, does not take it daily but frequently.  No GI side effects, check a BMP to monitor kidney function.  Recommend to complement pain management with Tylenol. Elevated BP: BP today is 146/86, at home is consistently in the 140s over 80s.  No change. Tachycardia: Heart rate slightly elevated  upon arrival, on my exam she is not tachycardic, heart rate is slightly irregular.  No symptoms. EKG today: NSR, PACs. Plan is observation Murmur: First time I hear a systolic murmur on her,  2/6.  No symptoms, check echo RTC 6 months.  This visit occurred during the SARS-CoV-2 public health emergency.  Safety protocols were in place, including screening questions prior to the visit, additional usage of staff PPE, and extensive cleaning of exam room while observing appropriate contact time as indicated for disinfecting solutions.

## 2019-09-12 NOTE — Patient Instructions (Addendum)
Continue the same medications. Continue checking your blood pressures  BP GOAL is between 110/65 and  145/85. If it is consistently higher or lower, let me know  Next Prolia should be by May 2021   GO TO THE LAB : Get the blood work     [place], please reschedule your appointments Come back for   a checkup in 6 months

## 2019-09-13 NOTE — Assessment & Plan Note (Signed)
DJD: Since the last visit, decrease meloxicam to 7.5 mg, does not take it daily but frequently.  No GI side effects, check a BMP to monitor kidney function.  Recommend to complement pain management with Tylenol. Elevated BP: BP today is 146/86, at home is consistently in the 140s over 80s.  No change. Tachycardia: Heart rate slightly elevated upon arrival, on my exam she is not tachycardic, heart rate is slightly irregular.  No symptoms. EKG today: NSR, PACs. Plan is observation Murmur: First time I hear a systolic murmur on her,  2/6.  No symptoms, check echo RTC 6 months.

## 2019-09-18 ENCOUNTER — Telehealth: Payer: Self-pay

## 2019-09-18 NOTE — Telephone Encounter (Signed)
Patient called in needing her Handicap sticker paper work filled out by Dr. Larose Kells. Please advise the patient when the form is ready for pick up.

## 2019-09-18 NOTE — Telephone Encounter (Signed)
We do not have her form- she will need to drop off at her convenience.

## 2019-09-20 ENCOUNTER — Other Ambulatory Visit: Payer: Self-pay

## 2019-09-20 ENCOUNTER — Ambulatory Visit (HOSPITAL_BASED_OUTPATIENT_CLINIC_OR_DEPARTMENT_OTHER)
Admission: RE | Admit: 2019-09-20 | Discharge: 2019-09-20 | Disposition: A | Discharge status: Home / Self Care | Payer: Medicare HMO | Source: Ambulatory Visit | Attending: Internal Medicine | Admitting: Internal Medicine

## 2019-09-20 DIAGNOSIS — R011 Cardiac murmur, unspecified: Secondary | ICD-10-CM | POA: Insufficient documentation

## 2019-09-20 NOTE — Telephone Encounter (Signed)
Form placed in PCP red folder for completion.

## 2019-09-20 NOTE — Telephone Encounter (Signed)
Pt came in office and dropped off document to be filled out by provider ([employer] Parking Placard 2 pages) Pt would like to be called when document ready to pick up Tel [phone]. Document put at front office tray under providers name.

## 2019-09-20 NOTE — Telephone Encounter (Signed)
Spoke w/ Pt- informed her that form is ready for pick up at front desk. Copy of form sent for scanning.

## 2019-09-20 NOTE — Progress Notes (Signed)
  Echocardiogram performed  Orman Matsumura T Kona Yusuf 09/20/2019, 11:34 AM

## 2019-10-11 ENCOUNTER — Encounter: Payer: Self-pay | Admitting: Family Medicine

## 2019-10-11 ENCOUNTER — Ambulatory Visit (INDEPENDENT_AMBULATORY_CARE_PROVIDER_SITE_OTHER): Payer: Medicare HMO | Admitting: Family Medicine

## 2019-10-11 ENCOUNTER — Other Ambulatory Visit: Payer: Self-pay

## 2019-10-11 VITALS — BP 150/75 | HR 72 | Temp 97.0°F | Resp 18 | Ht 60.0 in | Wt 160.0 lb

## 2019-10-11 DIAGNOSIS — L255 Unspecified contact dermatitis due to plants, except food: Secondary | ICD-10-CM

## 2019-10-11 MED ORDER — PREDNISONE 20 MG PO TABS: ORAL_TABLET | ORAL | 0 refills | Status: DC

## 2019-10-11 MED FILL — predniSONE 20 MG TABS: 20 | 10 days supply | Qty: 14 | Fill #0

## 2019-10-11 NOTE — Progress Notes (Signed)
[facility] [address] [place], [place] [address] [phone]  Date:  10/11/2019   Name:  Kimberly Rodriguez   DOB:  11-21-1926   MRN:  [id number]  PCP:  Colon Branch, MD    Chief Complaint: Allergic Reaction (rash while cutting crass this morning, itching, redness)   History of Present Illness:  Kimberly Rodriguez is a 86 y.o. very pleasant female patient who presents with the following:  Patient who normally sees my partner Dr. Larose Kells, history of arthritis, anemia, interstitial lung disease  Patient has symptoms of possible allergic reaction She was pulling weeds yesterday and seems to have gotten poison ivy on her face.   She has known history of sensitivity to poison ivy  She woke up with some itchy rash around her mouth and minimal swelling of her lower lip She notes no difficulty breathing or swallowing, no other rash or hives on her body She has not eaten any unusual foods or taken anything new as far as meds or supplements   She otherwise feels well.  We note that for her age she is in excellent health  Patient Active Problem List   Diagnosis Date Noted  . Closed nondisplaced fracture of lateral malleolus of left fibula 01/23/2019  . Closed left ankle fracture 01/2019  . PCP NOTES >>>>>>>>>>>>>>>>>>>>> 04/01/2015  . Lung disease--diffuse interstitial lung disease by x-ray 08-2014 09/05/2014  . Anxiety 02/29/2012  . Annual physical exam 12/11/2010  . DJD (degenerative joint disease) 10/24/2009  . Osteoporosis 06/05/2009    Past Medical History:  Diagnosis Date  . Anemia   . Blood transfusion without reported diagnosis   . Cataract   . Closed left ankle fracture 01/2019   no surgery  . Eustachian tube dysfunction    Right side, Dr. Laurance Flatten, ENT  . Microhematuria    s/p eval by urology 2008: U/S, CT, cysto w/ neg Bx (dx w/ a renal cyst, see reports)  . Osteoarthritis    h/o back pain, sees ortho s/p shots   . Osteoporosis     Past Surgical History:  Procedure Laterality Date  . ABDOMINAL HYSTERECTOMY     no oophorectomy per pt  . APPENDECTOMY    . COLONOSCOPY    . DILATION AND CURETTAGE OF UTERUS    . TONSILLECTOMY      Social History   Tobacco Use  . Smoking status: Former Research scientist (life sciences)  . Smokeless tobacco: Never Used  Substance Use Topics  . Alcohol use: Yes    Comment: socially -wine  . Drug use: No    Family History  Problem Relation Age of Onset  . Heart disease Father   . Prostate cancer Father   . Heart disease Mother   . Colon cancer Neg Hx   . Breast cancer Neg Hx   . Diabetes Neg Hx   . Osteoporosis Neg Hx     Allergies  Allergen Reactions  . Astelin [Azelastine Hcl] Palpitations    Medication list has been reviewed and updated.  Current Outpatient Medications on File Prior to Visit  Medication Sig Dispense Refill  . Calcium Carbonate (CALTRATE 600 PO) Take by mouth.      . denosumab (PROLIA) 60 MG/ML SOLN injection Inject 60 mg into the skin every 6 (six) months. Administer in upper arm, thigh, or abdomen    . Docusate Calcium (STOOL SOFTENER PO) Take by mouth.    . FIBER PO  Take by mouth.      . fluticasone (FLONASE) 50 MCG/ACT nasal spray Place 2 sprays into both nostrils daily as needed.     . meloxicam (MOBIC) 7.5 MG tablet Take 1 tablet (7.5 mg total) by mouth daily as needed for pain. 30 tablet 3  . Multiple Vitamins-Minerals (CENTRUM SILVER) CHEW Chew by mouth.      . pantoprazole (PROTONIX) 20 MG tablet Take 1 tablet (20 mg total) by mouth daily before breakfast. 90 tablet 3   No current facility-administered medications on file prior to visit.    Review of Systems:  As per HPI- otherwise negative.   Physical Examination: Vitals:   10/11/19 1112  BP: (!) 162/92  Pulse: 72  Resp: 18  Temp: (!) 97 F (36.1 C)  SpO2: 96%   Vitals:   10/11/19 1112  Weight: 160 lb (72.6 kg)  Height: 5' (1.524 m)   Body mass index is 31.25  kg/m. Ideal Body Weight: Weight in (lb) to have BMI = 25: 127.7  GEN: no acute distress.  Normal weight for age, patient looks well and younger than her years HEENT: Atraumatic, Normocephalic.   Bilateral TM wnl, oropharynx normal.  PEERL,EOMI.   She has some patches of skin inflammation on her neck, chin, and the lower half of her face.  These appear consistent with an early Rous dermatitis outbreak.  She has very minimal edema of the left aspect of her lower lip.  There is no tongue swelling, no palatal edema Ears and Nose: No external deformity. CV: RRR with early beats consistent with known history of frequent PAC, No M/G/R. No JVD. No thrill. No extra heart sounds. PULM: CTA B, no wheezes, crackles, rhonchi. No retractions. No resp. distress. No accessory muscle use. EXTR: No c/c/e PSYCH: Normally interactive. Conversant.   BP Readings from Last 3 Encounters:  10/11/19 (!) 162/92  09/12/19 (!) 146/86  05/15/19 (!) 167/83    Assessment and Plan: Rhus dermatitis - Plan: predniSONE (DELTASONE) 20 MG tablet  Older patient seen here today with likely Rhus dermatitis on her face.  She has a known exposure, she was pulling weeds in her garden yesterday. I will start her on prednisone, we discussed topical and over-the-counter medications which may also help.  She is cautioned both verbally and in writing to seek immediate care if any symptoms of a more serious allergic reaction develop such as difficulty breathing, or any more significant lip, tongue, mouth swelling  This visit occurred during the SARS-CoV-2 public health emergency.  Safety protocols were in place, including screening questions prior to the visit, additional usage of staff PPE, and extensive cleaning of exam room while observing appropriate contact time as indicated for disinfecting solutions.     Signed Lamar Blinks, MD

## 2019-10-11 NOTE — Patient Instructions (Signed)
It was nice to meet you today- it does look like you are having a poison ivy outbreak on your face I sent prednisone to the drug store on the ground floor; take for 10 days as directed  You can also apply topical benadryl or calamine lotion as needed You can use oral benadryl if needed, but use with caution as it make cause dizziness/ drowsiness  IF you start to have any significant lip or tongue swelling or any difficulty breathing please seek immediate help!

## 2019-10-12 ENCOUNTER — Telehealth: Payer: Self-pay | Admitting: Internal Medicine

## 2019-10-12 NOTE — Chronic Care Management (AMB) (Signed)
  Chronic Care Management   Note  10/12/2019 Name: Kimberly Rodriguez MRN: [id number] DOB: 28-May-1927  Kimberly Rodriguez is a 86 y.o. year old female who is a primary care patient of Colon Branch, MD. I reached out to [employer] by phone today in response to a referral sent by Kimberly Rodriguez PCP, Colon Branch, MD.   Kimberly Rodriguez was given information about Chronic Care Management services today including:  1. CCM service includes personalized support from designated clinical staff supervised by her physician, including individualized plan of care and coordination with other care providers 2. 24/7 contact phone numbers for assistance for urgent and routine care needs. 3. Service will only be billed when office clinical staff spend 20 minutes or more in a month to coordinate care. 4. Only one practitioner may furnish and bill the service in a calendar month. 5. The patient may stop CCM services at any time (effective at the end of the month) by phone call to the office staff.   Patient agreed to services and verbal consent obtained.    This note is not being shared with the patient for the following reason: To respect privacy (The patient or proxy has requested that the information not be shared).  Follow up plan:   Raynicia Dukes [employer] Scheduler

## 2019-10-15 ENCOUNTER — Ambulatory Visit (INDEPENDENT_AMBULATORY_CARE_PROVIDER_SITE_OTHER): Payer: Medicare HMO | Admitting: Family Medicine

## 2019-10-15 ENCOUNTER — Other Ambulatory Visit: Payer: Self-pay

## 2019-10-15 ENCOUNTER — Encounter: Payer: Self-pay | Admitting: Family Medicine

## 2019-10-15 ENCOUNTER — Telehealth: Payer: Self-pay | Admitting: Family Medicine

## 2019-10-15 VITALS — BP 155/72 | HR 67 | Temp 97.5°F | Resp 17 | Ht 60.0 in | Wt 160.0 lb

## 2019-10-15 DIAGNOSIS — L255 Unspecified contact dermatitis due to plants, except food: Secondary | ICD-10-CM | POA: Diagnosis not present

## 2019-10-15 MED ORDER — PREDNISONE 20 MG PO TABS: ORAL_TABLET | ORAL | 0 refills | Status: DC

## 2019-10-15 MED ORDER — TRIAMCINOLONE ACETONIDE 0.1 % EX CREA: 1.0000 "application " | TOPICAL_CREAM | Freq: Two times a day (BID) | CUTANEOUS | 0 refills | Status: DC

## 2019-10-15 MED FILL — predniSONE 20 MG TABS: 20 | 11 days supply | Qty: 20 | Fill #0

## 2019-10-15 MED FILL — TRIAMCINOLONE 0.1% CREAM: 0.1 | 30 days supply | Qty: 45 | Fill #0

## 2019-10-15 NOTE — Telephone Encounter (Signed)
Error

## 2019-10-15 NOTE — Patient Instructions (Signed)
I am sorry that your rash is still bothering you so much!   We will increase your prednisone taper as follows, and try a topical steroid cream as well Ok to use benadryl cream as well as needed for itching  Please call or send me a mychart message if not getting better over the next few days   Meds ordered this encounter  Medications  . predniSONE (DELTASONE) 20 MG tablet    Sig: Take 60 mg daily for 3 days, then 40 mg daily for 3 days, then 20 mg daily for 5 days    Dispense:  20 tablet    Refill:  0  . triamcinolone cream (KENALOG) 0.1 %    Sig: Apply 1 application topically 2 (two) times daily.    Dispense:  45 g    Refill:  0

## 2019-10-15 NOTE — Progress Notes (Signed)
[facility] [address], [place], [place] [address] [phone]  Date:  10/15/2019   Name:  Kimberly Rodriguez   DOB:  11-02-1926   MRN:  [id number]  PCP:  Colon Branch, MD    Chief Complaint: Rash (no better)   History of Present Illness:  Kimberly Rodriguez is a 86 y.o. very pleasant female patient who presents with the following:  Pt of Dr Larose Kells here today for a follow-up visit Seen by myself this past Thursday 4/22  with concern of rash on her face thought due to rhus dermatitis  She had recently been working in her garden Otherwise she is feeling well, and is generally in excellent health for her age  She was treated with oral prednisone for 8 days-she took 40 mg for 4 days, today she just took 20 mg  No fever or chills, no shortness of breath  She came back today as her rash seems to be spreading, it has now moved to her forehead, neck, and left hand.  It is itchy, the itchiness makes it hard for her to sleep Patient Active Problem List   Diagnosis Date Noted  . Closed nondisplaced fracture of lateral malleolus of left fibula 01/23/2019  . Closed left ankle fracture 01/2019  . PCP NOTES >>>>>>>>>>>>>>>>>>>>> 04/01/2015  . Lung disease--diffuse interstitial lung disease by x-ray 08-2014 09/05/2014  . Anxiety 02/29/2012  . Annual physical exam 12/11/2010  . DJD (degenerative joint disease) 10/24/2009  . Osteoporosis 06/05/2009    Past Medical History:  Diagnosis Date  . Anemia   . Blood transfusion without reported diagnosis   . Cataract   . Closed left ankle fracture 01/2019   no surgery  . Eustachian tube dysfunction    Right side, Dr. Laurance Flatten, ENT  . Microhematuria    s/p eval by urology 2008: U/S, CT, cysto w/ neg Bx (dx w/ a renal cyst, see reports)  . Osteoarthritis    h/o back pain, sees ortho s/p shots  . Osteoporosis     Past Surgical History:  Procedure Laterality Date  . ABDOMINAL HYSTERECTOMY      no oophorectomy per pt  . APPENDECTOMY    . COLONOSCOPY    . DILATION AND CURETTAGE OF UTERUS    . TONSILLECTOMY      Social History   Tobacco Use  . Smoking status: Former Research scientist (life sciences)  . Smokeless tobacco: Never Used  Substance Use Topics  . Alcohol use: Yes    Comment: socially -wine  . Drug use: No    Family History  Problem Relation Age of Onset  . Heart disease Father   . Prostate cancer Father   . Heart disease Mother   . Colon cancer Neg Hx   . Breast cancer Neg Hx   . Diabetes Neg Hx   . Osteoporosis Neg Hx     Allergies  Allergen Reactions  . Astelin [Azelastine Hcl] Palpitations    Medication list has been reviewed and updated.  Current Outpatient Medications on File Prior to Visit  Medication Sig Dispense Refill  . Calcium Carbonate (CALTRATE 600 PO) Take by mouth.      . denosumab (PROLIA) 60 MG/ML SOLN injection Inject 60 mg into the skin every 6 (six) months. Administer in upper arm, thigh, or abdomen    . Docusate Calcium (STOOL SOFTENER PO) Take by mouth.    . FIBER PO Take by mouth.      Marland Kitchen  fluticasone (FLONASE) 50 MCG/ACT nasal spray Place 2 sprays into both nostrils daily as needed.     . meloxicam (MOBIC) 7.5 MG tablet Take 1 tablet (7.5 mg total) by mouth daily as needed for pain. 30 tablet 3  . Multiple Vitamins-Minerals (CENTRUM SILVER) CHEW Chew by mouth.      . pantoprazole (PROTONIX) 20 MG tablet Take 1 tablet (20 mg total) by mouth daily before breakfast. 90 tablet 3  . predniSONE (DELTASONE) 20 MG tablet Take 40 mg daily for 4 days, then 20 mg daily for 6 days 14 tablet 0   No current facility-administered medications on file prior to visit.    Review of Systems:  As per HPI- otherwise negative.  BP Readings from Last 3 Encounters:  10/15/19 (!) 169/73  10/11/19 (!) 150/75  09/12/19 (!) 146/86    Physical Examination: Vitals:   10/15/19 1333  BP: (!) 169/73  Pulse: 67  Resp: 17  Temp: (!) 97.5 F (36.4 C)  SpO2: 100%    Vitals:   10/15/19 1333  Weight: 160 lb (72.6 kg)  Height: 5' (1.524 m)   Body mass index is 31.25 kg/m. Ideal Body Weight: Weight in (lb) to have BMI = 25: 127.7  GEN: no acute distress.  Looks well, normal weight for age 38: Atraumatic, Normocephalic.  Ears and Nose: No external deformity. CV: RRR, No M/G/R. No JVD. No thrill. No extra heart sounds. PULM: CTA B, no wheezes, crackles, rhonchi. No retractions. No resp. distress. No accessory muscle use. EXTR: No c/c/e PSYCH: Normally interactive. Conversant.  The original Rhus dermatitis rash around her mouth and on her lip has improved, however the rash has now spread to her forehead, neck, left hand-see photo below I see no evidence of angioedema, no lip, tongue, throat swelling is present      Assessment and Plan: Rhus dermatitis - Plan: predniSONE (DELTASONE) 20 MG tablet, triamcinolone cream (KENALOG) 0.1 %  Patient here today with likely Rhus dermatitis.  She was seen last week with a small area of dermatitis around her mouth.  This is improved, but the rash is spreading to other areas of her body.  I explained to her this is fairly typical of a more widespread exposure to poison ivy or similar  Offered increased dose of steroids versus observation and time.  She is quite bothered by her symptoms, would like to try a higher dose of steroid She does not have diabetes Well increase her steroid taper as below, she may also use Benadryl cream and topical triamcinolone as needed.  Cautioned to use triamcinolone no more than once daily on her face  I have asked her to keep me closely posted about her progress, let me know if getting worse or if not improving over the course of this week  Meds ordered this encounter  Medications  . predniSONE (DELTASONE) 20 MG tablet    Sig: Take 60 mg daily for 3 days, then 40 mg daily for 3 days, then 20 mg daily for 5 days    Dispense:  20 tablet    Refill:  0  . triamcinolone cream  (KENALOG) 0.1 %    Sig: Apply 1 application topically 2 (two) times daily.    Dispense:  45 g    Refill:  0    This visit occurred during the SARS-CoV-2 public health emergency.  Safety protocols were in place, including screening questions prior to the visit, additional usage of staff PPE, and extensive cleaning of  exam room while observing appropriate contact time as indicated for disinfecting solutions.   Moderate medical decision making today Signed Lamar Blinks, MD

## 2019-11-01 ENCOUNTER — Other Ambulatory Visit (HOSPITAL_BASED_OUTPATIENT_CLINIC_OR_DEPARTMENT_OTHER): Payer: Self-pay | Admitting: Internal Medicine

## 2019-11-01 DIAGNOSIS — Z1231 Encounter for screening mammogram for malignant neoplasm of breast: Secondary | ICD-10-CM

## 2019-11-07 ENCOUNTER — Other Ambulatory Visit: Payer: Self-pay

## 2019-11-07 DIAGNOSIS — M159 Polyosteoarthritis, unspecified: Secondary | ICD-10-CM

## 2019-11-07 DIAGNOSIS — M810 Age-related osteoporosis without current pathological fracture: Secondary | ICD-10-CM

## 2019-11-09 ENCOUNTER — Other Ambulatory Visit: Payer: Self-pay

## 2019-11-09 ENCOUNTER — Ambulatory Visit: Payer: Medicare HMO | Admitting: Pharmacist

## 2019-11-09 DIAGNOSIS — M810 Age-related osteoporosis without current pathological fracture: Secondary | ICD-10-CM

## 2019-11-09 DIAGNOSIS — R030 Elevated blood-pressure reading, without diagnosis of hypertension: Secondary | ICD-10-CM

## 2019-11-09 NOTE — Chronic Care Management (AMB) (Signed)
Chronic Care Management Pharmacy  Name: Kimberly Rodriguez  MRN: [id number] DOB: Jan 14, 1927  Chief Complaint/ HPI  Kimberly Rodriguez,  86 y.o. , female presents for their Initial CCM visit with the clinical pharmacist via telephone due to COVID-19 Pandemic.  PCP : Colon Branch, MD  Their chronic conditions include: Hypertension, Osteoporosis, GERD, Ankle Pain  Office Visits: 10/15/19: Visit w/ Dr. Lorelei Pont - Rhus dermatitis. Rash improved, but spreading to other areas. This is concerning for patient so steroid dose increased. She may also use benadryl and triamcinolone as needed. Triamcinolone no more than once daily on face.   10/11/19: Visit w/ Dr. Lorelei Pont - Rhus dermatitis. Rash around mouth from possible exposure to poison ivy. Predisone prescribed.   09/12/19: Visit w/ Dr. Larose Kells - Evidence of systolic murmur. EKG with NSR and PACs, check ECHO. RTC in 6 months.   05/15/19: Visit w/ Dr. Larose Kells - Decreased meloxicam to 7.5mg  daily. BP in 160s so recommended ambulatory BP. Labs ordered (CBC, Lipid, CMP)  Consult Visit: 07/18/19: Ophthlamology visit w/ Dr. Rex Kras - Cataract OS slightly worse, but not yet ready for surgery. Monitor.   Medications: Outpatient Encounter Medications as of 11/09/2019  Medication Sig Note  . Calcium Carbonate (CALTRATE 600 PO) Take by mouth.     . denosumab (PROLIA) 60 MG/ML SOLN injection Inject 60 mg into the skin every 6 (six) months. Administer in upper arm, thigh, or abdomen   . Docusate Calcium (STOOL SOFTENER PO) Take by mouth.   . FIBER PO Take by mouth.     . fluticasone (FLONASE) 50 MCG/ACT nasal spray Place 2 sprays into both nostrils daily as needed.  12/02/2015: PRN  . meloxicam (MOBIC) 7.5 MG tablet Take 1 tablet (7.5 mg total) by mouth daily as needed for pain.   . Multiple Vitamins-Minerals (CENTRUM SILVER) CHEW Chew by mouth.     . pantoprazole (PROTONIX) 20 MG tablet Take 1 tablet (20 mg total) by mouth daily before breakfast.   . predniSONE  (DELTASONE) 20 MG tablet Take 40 mg daily for 4 days, then 20 mg daily for 6 days (Patient not taking: Reported on 11/09/2019)   . predniSONE (DELTASONE) 20 MG tablet Take 60 mg daily for 3 days, then 40 mg daily for 3 days, then 20 mg daily for 5 days (Patient not taking: Reported on 11/09/2019)   . triamcinolone cream (KENALOG) 0.1 % Apply 1 application topically 2 (two) times daily. (Patient not taking: Reported on 11/09/2019)    No facility-administered encounter medications on file as of 11/09/2019.     Current Diagnosis/Assessment:  Goals Addressed            This Visit's Progress   . Chronic Care Management Pharmacy Care Plan       CARE PLAN ENTRY  Current Barriers:  . Chronic Disease Management support, education, and care coordination needs related to Hypertension, Osteoporosis, GERD, Ankle Pain   Hypertension . Pharmacist Clinical Goal(s): o Over the next 90 days, patient will work with PharmD and providers to maintain BP goal <130/80 . Current regimen:  o Diet and exercise management   . Patient self care activities - Over the next 90 days, patient will: o Check BP 1-2 times per week, document, and provide at future appointments o Ensure daily salt intake < 2300 mg/[name]  Osteoporosis . Pharmacist Clinical Goal(s) o Over the next 90 days, patient will work with PharmD and providers to reduce risk of fracture due to osteoporosis . Current regimen:  o Prolia 60mg /mL every 6 months, calcium carbonate 630mg , vitamin D 1000 units daily . Interventions: o Consider completing DEXA at same time as mammogram if agreeable with Dr. Larose Kells . Patient self care activities - Over the next 90 days, patient will: o Complete DEXA at same time as mammogram if agreeable with Dr. Larose Kells  Medication management . Pharmacist Clinical Goal(s): o Over the next 90 days, patient will work with PharmD and providers to maintain optimal medication adherence . Current pharmacy: [facility]  order . Interventions o Comprehensive medication review performed. o Continue current medication management strategy . Patient self care activities - Over the next 90 days, patient will: o Focus on medication adherence by filling medications appropriately  o Take medications as prescribed o Report any questions or concerns to PharmD and/or provider(s)  Initial goal documentation       Social Hx:  Originally from [place].  Came here due to her husband's sister living.  Widowed for 7 years. No children. No  Pets. Neighbors have children.   Hypertension   CMP Latest Ref Rng & Units 05/15/2019 09/18/2018 07/12/2017  Glucose 70 - 99 mg/dL 86 110(H) -  BUN 6 - 23 mg/dL 17 21 -  Creatinine 0.40 - 1.20 mg/dL 0.70 0.73 -  Sodium 135 - 145 mEq/L 137 139 -  Potassium 3.5 - 5.1 mEq/L 4.4 4.5 -  Chloride 96 - 112 mEq/L 101 103 -  CO2 19 - 32 mEq/L 32 29 -  Calcium 8.4 - 10.5 mg/dL 9.4 9.2 9.7  Total Protein 6.0 - 8.3 g/dL 6.2 6.1 6.7  Total Bilirubin 0.2 - 1.2 mg/dL 0.5 0.3 -  Alkaline Phos 39 - 117 U/L 78 67 -  AST 0 - 37 U/L 18 18 -  ALT 0 - 35 U/L 14 13 -   Kidney Function Lab Results  Component Value Date/Time   CREATININE 0.70 05/15/2019 10:31 AM   CREATININE 0.73 09/18/2018 02:28 PM   GFR 78.57 05/15/2019 10:31 AM   GFRNONAA 78 (L) 09/02/2014 08:45 PM   GFRAA 90 (L) 09/02/2014 08:45 PM     BP today is: Unable to assess due to phone visit   Office blood pressures are  BP Readings from Last 3 Encounters:  10/15/19 (!) 155/72  10/11/19 (!) 150/75  09/12/19 (!) 146/86   Blood pressure goal <140/90  Patient has failed these meds in the past: None noted  Patient is currently controlled per home BP although uncontrolled per clinic BP on the following medications: None noted   Patient checks BP at home 1-2x per week  Patient home BP readings are ranging:   126 67 72 145 73 74 141 76 64 134 79 63 122 77 65 136 63 54 128 60 74 150 75 42 129  70 59 Average 134.5 71.1 63  We discussed Possibility of white coat hypertension  Plan -Continue current medications    Osteoporosis   Last DEXA Scan: 05/25/2017  T-Score forearm radius: -3.4  T-Score femoral neck: -1.7  VITD  Date Value Ref Range Status  07/12/2017 54.15 30.00 - 100.00 ng/mL Final   Patient has failed these meds in past: fosamax, has taken reclast in the past (tolerated fine?) Patient is currently controlled on the following medications: Prolia 50mg /ml every 6 months, calcium carbonate 630mg , vitamin D 1000 units daily  Fractured her ankle in 2020  We discussed:  Importance of checking DEXA to assess bone density  Plan -Complete a DEXA Scan same time as mammo if able  and agreeable with Dr. Larose Kells -Continue current medications  GERD    Patient has failed these meds in past: None noted  Patient is currently controlled on the following medications: pantoprazole 20mg  daily AM  "I never realized that I had acid reflux" Patient reports she does have a hiatal hernia and this was started by gastro.  Breakthrough Sx: None   We discussed:  Risk/benefit of  continuation/discontinuation of PPI use noting that patient reports a hiatal hernia and was started by gast  Plan -Continue current medications   Ankle Pain    Patient has failed these meds in past: None noted  Patient is currently controlled on the following medications: meloxicam 7.5mg  as needed  Uses once or twice per week.  "Every once in a while the ankle feels uncomfortable" Feels it does help with the pain. She feels more comfortable after taking the medication  Plan -Continue current medications  Miscellaneous Meds  Fiber Stool softener  Meds to D/C from list  Prednisone x2 (completed course) Triamcinolone

## 2019-11-09 NOTE — Patient Instructions (Signed)
Visit Information  Goals Addressed            This Visit's Progress   . Chronic Care Management Pharmacy Care Plan       CARE PLAN ENTRY  Current Barriers:  . Chronic Disease Management support, education, and care coordination needs related to Hypertension, Osteoporosis, GERD, Ankle Pain   Hypertension . Pharmacist Clinical Goal(s): o Over the next 90 days, patient will work with PharmD and providers to maintain BP goal <130/80 . Current regimen:  o Diet and exercise management   . Patient self care activities - Over the next 90 days, patient will: o Check BP 1-2 times per week, document, and provide at future appointments o Ensure daily salt intake < 2300 mg/Kimberly Rodriguez  Osteoporosis . Pharmacist Clinical Goal(s) o Over the next 90 days, patient will work with PharmD and providers to reduce risk of fracture due to osteoporosis . Current regimen:  o Prolia 60mg /mL every 6 months, calcium carbonate 630mg , vitamin D 1000 units daily . Interventions: o Consider completing DEXA at same time as mammogram if agreeable with Dr. Larose Kells . Patient self care activities - Over the next 90 days, patient will: o Complete DEXA at same time as mammogram if agreeable with Dr. Larose Kells  Medication management . Pharmacist Clinical Goal(s): o Over the next 90 days, patient will work with PharmD and providers to maintain optimal medication adherence . Current pharmacy: Mail order . Interventions o Comprehensive medication review performed. o Continue current medication management strategy . Patient self care activities - Over the next 90 days, patient will: o Focus on medication adherence by filling medications appropriately  o Take medications as prescribed o Report any questions or concerns to PharmD and/or provider(s)  Initial goal documentation        Kimberly Rodriguez was given information about Chronic Care Management services today including:  1. CCM service includes personalized support from  designated clinical staff supervised by her physician, including individualized plan of care and coordination with other care providers 2. 24/7 contact phone numbers for assistance for urgent and routine care needs. 3. Standard insurance, coinsurance, copays and deductibles apply for chronic care management only during months in which we provide at least 20 minutes of these services. Most insurances cover these services at 100%, however patients may be responsible for any copay, coinsurance and/or deductible if applicable. This service may help you avoid the need for more expensive face-to-face services. 4. Only one practitioner may furnish and bill the service in a calendar month. 5. The patient may stop CCM services at any time (effective at the end of the month) by phone call to the office staff.  Patient agreed to services and verbal consent obtained.   The patient verbalized understanding of instructions provided today and agreed to receive a mailed copy of patient instruction and/or educational materials. Telephone follow up appointment with pharmacy team member scheduled for: 02/08/2020  Melvenia Beam Sabri Teal, PharmD Clinical Pharmacist [facility] [phone]    How to Take Your Blood Pressure You can take your blood pressure at home with a machine. You may need to check your blood pressure at home:  To check if you have high blood pressure (hypertension).  To check your blood pressure over time.  To make sure your blood pressure medicine is working. Supplies needed: You will need a blood pressure machine, or monitor. You can buy one at a drugstore or online. When choosing one:  Choose one with an arm  cuff.  Choose one that wraps around your upper arm. Only one finger should fit between your arm and the cuff.  Do not choose one that measures your blood pressure from your wrist or finger. Your doctor can suggest a monitor. How to prepare Avoid these  things for 30 minutes before checking your blood pressure:  Drinking caffeine.  Drinking alcohol.  Eating.  Smoking.  Exercising. Five minutes before checking your blood pressure:  Pee.  Sit in a dining chair. Avoid sitting in a soft couch or armchair.  Be quiet. Do not talk. How to take your blood pressure Follow the instructions that came with your machine. If you have a digital blood pressure monitor, these may be the instructions: 1. Sit up straight. 2. Place your feet on the floor. Do not cross your ankles or legs. 3. Rest your left arm at the level of your heart. You may rest it on a table, desk, or chair. 4. Pull up your shirt sleeve. 5. Wrap the blood pressure cuff around the upper part of your left arm. The cuff should be 1 inch (2.5 cm) above your elbow. It is best to wrap the cuff around bare skin. 6. Fit the cuff snugly around your arm. You should be able to place only one finger between the cuff and your arm. 7. Put the cord inside the groove of your elbow. 8. Press the power button. 9. Sit quietly while the cuff fills with air and loses air. 10. Write down the numbers on the screen. 11. Wait 2-3 minutes and then repeat steps 1-10. What do the numbers mean? Two numbers make up your blood pressure. The first number is called systolic pressure. The second is called diastolic pressure. An example of a blood pressure reading is "120 over 80" (or 120/80). If you are an adult and do not have a medical condition, use this guide to find out if your blood pressure is normal: Normal  First number: below 120.  Second number: below 80. Elevated  First number: 120-129.  Second number: below 80. Hypertension stage 1  First number: 130-139.  Second number: 80-89. Hypertension stage 2  First number: 140 or above.  Second number: 66 or above. Your blood pressure is above normal even if only the top or bottom number is above normal. Follow these instructions at  home:  Check your blood pressure as often as your doctor tells you to.  Take your monitor to your next doctor's appointment. Your doctor will: ? Make sure you are using it correctly. ? Make sure it is working right.  Make sure you understand what your blood pressure numbers should be.  Tell your doctor if your medicines are causing side effects. Contact a doctor if:  Your blood pressure keeps being high. Get help right away if:  Your first blood pressure number is higher than 180.  Your second blood pressure number is higher than 120. This information is not intended to replace advice given to you by your health care provider. Make sure you discuss any questions you have with your health care provider. Document Revised: 05/20/2017 Document Reviewed: 11/14/2015 Elsevier Patient Education  2020 [place].  Blood Pressure Record Sheet To take your blood pressure, you will need a blood pressure machine. You can buy a blood pressure machine (blood pressure monitor) at your clinic, drug store, or online. When choosing one, consider:  An automatic monitor that has an arm cuff.  A cuff that wraps snugly around your  upper arm. You should be able to fit only one finger between your arm and the cuff.  A device that stores blood pressure reading results.  Do not choose a monitor that measures your blood pressure from your wrist or finger. Follow your health care provider's instructions for how to take your blood pressure. To use this form:  Get one reading in the morning (a.m.) before you take any medicines.  Get one reading in the evening (p.m.) before supper.  Take at least 2 readings with each blood pressure check. This makes sure the results are correct. Wait 1-2 minutes between measurements.  Write down the results in the spaces on this form.  Repeat this once a week, or as told by your health care provider.  Make a follow-up appointment with your health care provider to  discuss the results. Blood pressure log Date: _______________________  a.m. _____________________(1st reading) _____________________(2nd reading)  p.m. _____________________(1st reading) _____________________(2nd reading) Date: _______________________  a.m. _____________________(1st reading) _____________________(2nd reading)  p.m. _____________________(1st reading) _____________________(2nd reading) Date: _______________________  a.m. _____________________(1st reading) _____________________(2nd reading)  p.m. _____________________(1st reading) _____________________(2nd reading) Date: _______________________  a.m. _____________________(1st reading) _____________________(2nd reading)  p.m. _____________________(1st reading) _____________________(2nd reading) Date: _______________________  a.m. _____________________(1st reading) _____________________(2nd reading)  p.m. _____________________(1st reading) _____________________(2nd reading) This information is not intended to replace advice given to you by your health care provider. Make sure you discuss any questions you have with your health care provider. Document Revised: 08/05/2017 Document Reviewed: 06/07/2017 Elsevier Patient Education  [place].

## 2019-11-12 ENCOUNTER — Other Ambulatory Visit: Payer: Self-pay

## 2019-11-13 ENCOUNTER — Ambulatory Visit (INDEPENDENT_AMBULATORY_CARE_PROVIDER_SITE_OTHER): Payer: Medicare HMO

## 2019-11-13 ENCOUNTER — Other Ambulatory Visit: Payer: Self-pay

## 2019-11-13 DIAGNOSIS — M1990 Unspecified osteoarthritis, unspecified site: Secondary | ICD-10-CM

## 2019-11-13 MED ORDER — DENOSUMAB 60 MG/ML ~~LOC~~ SOSY
60.0000 mg | PREFILLED_SYRINGE | Freq: Once | SUBCUTANEOUS | Status: AC
  Administered 2019-11-13: 60 mg via SUBCUTANEOUS

## 2019-11-13 NOTE — Progress Notes (Addendum)
Patient here today for Prolia Injection. 1 mL given in left arm SQ. Patient tolerated well.    Kathlene November, MD

## 2019-11-16 ENCOUNTER — Other Ambulatory Visit: Payer: Self-pay

## 2019-11-16 MED ORDER — MELOXICAM 7.5 MG PO TABS: 7.5000 mg | ORAL_TABLET | Freq: Every day | ORAL | 0 refills | Status: DC | PRN

## 2019-12-05 ENCOUNTER — Ambulatory Visit (HOSPITAL_BASED_OUTPATIENT_CLINIC_OR_DEPARTMENT_OTHER)
Admission: RE | Admit: 2019-12-05 | Discharge: 2019-12-05 | Disposition: A | Discharge status: Home / Self Care | Payer: Medicare HMO | Source: Ambulatory Visit | Attending: Internal Medicine | Admitting: Internal Medicine

## 2019-12-05 ENCOUNTER — Other Ambulatory Visit: Payer: Self-pay

## 2019-12-05 ENCOUNTER — Ambulatory Visit (HOSPITAL_BASED_OUTPATIENT_CLINIC_OR_DEPARTMENT_OTHER): Payer: Medicare HMO

## 2019-12-05 DIAGNOSIS — Z1231 Encounter for screening mammogram for malignant neoplasm of breast: Secondary | ICD-10-CM | POA: Diagnosis not present

## 2019-12-05 NOTE — Progress Notes (Signed)
I connected with Yannet today by telephone and verified that I am speaking with the correct person using two identifiers. Location patient: home Location provider: work Persons participating in the virtual visit: patient, Marine scientist.    I discussed the limitations, risks, security and privacy concerns of performing an evaluation and management service by telephone and the availability of in person appointments. I also discussed with the patient that there may be a patient responsible charge related to this service. The patient expressed understanding and verbally consented to this telephonic visit.    Interactive audio and video telecommunications were attempted between this RN and patient, however failed, due to patient having technical difficulties OR patient did not have access to video capability.  We continued and completed visit with audio only.  Some vital signs may be absent or patient reported.   Subjective:   Kimberly Rodriguez is a 86 y.o. female who presents for Medicare Annual (Subsequent) preventive examination.  Review of Systems:   Home Safety/Smoke Alarms: Feels safe in home. Smoke alarms in place.  Lives alone in 1 story home w/ grab bars. Friends check on her often.  Female:      Mammo-  Pt reports done yesterday.   Dexa scan-  Pt states she will wait until she sees Dr.Paz 03/14/20          Objective:     Vitals: BP (!) 151/82 Comment: pt reported  There is no height or weight on file to calculate BMI.  Advanced Directives 12/06/2019 12/05/2018 03/08/2018 12/02/2017 12/01/2016  Does Patient Have a Medical Advance Directive? Yes Yes Yes Yes Yes  Type of Paramedic of [place];Living will [place];Living will - [place];Living will [place];Living will  Does patient want to make changes to medical advance directive? No - Patient declined No - Patient declined No - Patient declined - -  Copy of  [place] in Chart? No - copy requested No - copy requested - No - copy requested No - copy requested    Tobacco Social History   Tobacco Use  Smoking Status Former Smoker  Smokeless Tobacco Never Used     Counseling given: Not Answered   Clinical Intake:     Pain : No/denies pain                 Past Medical History:  Diagnosis Date  . Anemia   . Blood transfusion without reported diagnosis   . Cataract   . Closed left ankle fracture 01/2019   no surgery  . Eustachian tube dysfunction    Right side, Dr. Laurance Flatten, ENT  . Microhematuria    s/p eval by urology 2008: U/S, CT, cysto w/ neg Bx (dx w/ a renal cyst, see reports)  . Osteoarthritis    h/o back pain, sees ortho s/p shots  . Osteoporosis    Past Surgical History:  Procedure Laterality Date  . ABDOMINAL HYSTERECTOMY     no oophorectomy per pt  . APPENDECTOMY    . COLONOSCOPY    . DILATION AND CURETTAGE OF UTERUS    . TONSILLECTOMY     Family History  Problem Relation Age of Onset  . Heart disease Father   . Prostate cancer Father   . Heart disease Mother   . Colon cancer Neg Hx   . Breast cancer Neg Hx   . Diabetes Neg Hx   . Osteoporosis Neg Hx  Social History   Socioeconomic History  . Marital status: Widowed    Spouse name: Not on file  . Number of children: 0  . Years of education: Not on file  . Highest education level: Not on file  Occupational History  . Occupation: retired  Tobacco Use  . Smoking status: Former Research scientist (life sciences)  . Smokeless tobacco: Never Used  Substance and Sexual Activity  . Alcohol use: Yes    Comment: socially -wine  . Drug use: No  . Sexual activity: Not on file  Other Topics Concern  . Not on file  Social History Narrative   Household -- lives by herself in a townhouse    Still drives    Emergency contact-- [name] (sister in law) lives in [place] --[phone]   She is her health POA   Social Determinants of Health   Financial Resource  Strain: Low Risk   . Difficulty of Paying Living Expenses: Not hard at all  Food Insecurity: No Food Insecurity  . Worried About Charity fundraiser in the Last Year: Never true  . Ran Out of Food in the Last Year: Never true  Transportation Needs: No Transportation Needs  . Lack of Transportation (Medical): No  . Lack of Transportation (Non-Medical): No  Physical Activity:   . Days of Exercise per Week:   . Minutes of Exercise per Session:   Stress:   . Feeling of Stress :   Social Connections:   . Frequency of Communication with Friends and Family:   . Frequency of Social Gatherings with Friends and Family:   . Attends Religious Services:   . Active Member of Clubs or Organizations:   . Attends Archivist Meetings:   Marland Kitchen Marital Status:     Outpatient Encounter Medications as of 12/06/2019  Medication Sig  . Calcium Carbonate (CALTRATE 600 PO) Take by mouth.    . denosumab (PROLIA) 60 MG/ML SOLN injection Inject 60 mg into the skin every 6 (six) months. Administer in upper arm, thigh, or abdomen  . Docusate Calcium (STOOL SOFTENER PO) Take by mouth.  . FIBER PO Take by mouth.    . fluticasone (FLONASE) 50 MCG/ACT nasal spray Place 2 sprays into both nostrils daily as needed.   . meloxicam (MOBIC) 7.5 MG tablet Take 1 tablet (7.5 mg total) by mouth daily as needed for pain.  . Multiple Vitamins-Minerals (CENTRUM SILVER) CHEW Chew by mouth.    . pantoprazole (PROTONIX) 20 MG tablet Take 1 tablet (20 mg total) by mouth daily before breakfast.   No facility-administered encounter medications on file as of 12/06/2019.    Activities of Daily Living In your present state of health, do you have any difficulty performing the following activities: 12/06/2019  Hearing? N  Vision? N  Difficulty concentrating or making decisions? N  Walking or climbing stairs? N  Dressing or bathing? N  Doing errands, shopping? N  Preparing Food and eating ? N  Using the Toilet? N  In the past  six months, have you accidently leaked urine? N  Do you have problems with loss of bowel control? N  Managing your Medications? N  Managing your Finances? N  Housekeeping or managing your Housekeeping? N  Some recent data might be hidden    Patient Care Team: Colon Branch, MD as PCP - General Roel Cluck, MD as Referring Physician (Ophthalmology) Maggie Schwalbe., MD (Family Medicine) Day, Melvenia Beam, [facility] as Pharmacist (Pharmacist)    Assessment:  This is a routine wellness examination for [place]. Physical assessment deferred to PCP.  Exercise Activities and Dietary recommendations Current Exercise Habits: The patient does not participate in regular exercise at present, Exercise limited by: None identified   Diet (meal preparation, eat out, water intake, caffeinated beverages, dairy products, fruits and vegetables): well balanced   Goals    .  Chronic Care Management Pharmacy Care Plan      CARE PLAN ENTRY  Current Barriers:  . Chronic Disease Management support, education, and care coordination needs related to Hypertension, Osteoporosis, GERD, Ankle Pain   Hypertension . Pharmacist Clinical Goal(s): o Over the next 90 days, patient will work with PharmD and providers to maintain BP goal <130/80 . Current regimen:  o Diet and exercise management   . Patient self care activities - Over the next 90 days, patient will: o Check BP 1-2 times per week, document, and provide at future appointments o Ensure daily salt intake < 2300 mg/day  Osteoporosis . Pharmacist Clinical Goal(s) o Over the next 90 days, patient will work with PharmD and providers to reduce risk of fracture due to osteoporosis . Current regimen:  o Prolia 60mg /mL every 6 months, calcium carbonate 630mg , vitamin D 1000 units daily . Interventions: o Consider completing DEXA at same time as mammogram if agreeable with Dr. Larose Kells . Patient self care activities - Over the next 90 days, patient  will: o Complete DEXA at same time as mammogram if agreeable with Dr. Larose Kells  Medication management . Pharmacist Clinical Goal(s): o Over the next 90 days, patient will work with PharmD and providers to maintain optimal medication adherence . Current pharmacy: Mail order . Interventions o Comprehensive medication review performed. o Continue current medication management strategy . Patient self care activities - Over the next 90 days, patient will: o Focus on medication adherence by filling medications appropriately  o Take medications as prescribed o Report any questions or concerns to PharmD and/or provider(s)  Initial goal documentation     .  DIET - INCREASE WATER INTAKE    .  Increase physical activity      Do weightbearing exercises     .  Maintain current healthy lifestyle. (pt-stated)       Fall Risk Fall Risk  12/06/2019 12/05/2018 12/02/2017 05/04/2017 04/29/2016  Falls in the past year? 1 0 No No No  Number falls in past yr: 0 - - - -  Injury with Fall? 1 - - - -  Follow up Education provided;Falls prevention discussed - - - -   Depression Screen PHQ 2/9 Scores 12/06/2019 12/05/2018 12/02/2017 05/04/2017  PHQ - 2 Score 0 0 0 0     Cognitive Function Ad8 score reviewed for issues:  Issues making decisions:no  Less interest in hobbies / activities:no  Repeats questions, stories (family complaining):no  Trouble using ordinary gadgets (microwave, computer, phone):no  Forgets the month or year: no  Mismanaging finances: no  Remembering appts:no  Daily problems with thinking and/or memory:no Ad8 score is=0  MMSE - Mini Mental State Exam 12/02/2017 12/01/2016  Orientation to time 5 5  Orientation to Place 5 5  Registration 3 3  Attention/ Calculation 5 5  Recall 3 2  Language- name 2 objects 2 2  Language- repeat 1 1  Language- follow 3 step command 3 3  Language- read & follow direction 1 1  Write a sentence 1 1  Copy design 1 1  Total score 30 29  Immunization History  Administered Date(s) Administered  . Fluad Quad(high Dose 65+) 03/16/2019  . Influenza Split 02/29/2012  . Influenza Whole 04/17/2008  . Influenza, High Dose Seasonal PF 04/01/2015, 04/29/2016, 05/04/2017, 05/10/2018  . Influenza,inj,Quad PF,6+ Mos 03/27/2014  . PFIZER SARS-COV-2 Vaccination 06/30/2019, 07/21/2019  . Pneumococcal Conjugate-13 03/27/2014  . Pneumococcal Polysaccharide-23 09/30/2004, 05/10/2018  . Td 03/03/2001  . Tdap 12/11/2010  . Zoster 11/29/2008   Screening Tests Health Maintenance  Topic Date Due  . DEXA SCAN  05/26/2019  . INFLUENZA VACCINE  01/20/2020  . TETANUS/TDAP  12/10/2020  . COVID-19 Vaccine  Completed  . PNA vac Low Risk Adult  Completed      Plan:    Please schedule your next medicare wellness visit with me in 1 yr.  Continue to eat heart healthy diet (full of fruits, vegetables, whole grains, lean protein, water--limit salt, fat, and sugar intake) and increase physical activity as tolerated.  Continue doing brain stimulating activities (puzzles, reading, adult coloring books, staying active) to keep memory sharp.   Bring a copy of your living will and/or healthcare power of attorney to your next office visit.    I have personally reviewed and noted the following in the patient's chart:   . Medical and social history . Use of alcohol, tobacco or illicit drugs  . Current medications and supplements . Functional ability and status . Nutritional status . Physical activity . Advanced directives . List of other physicians . Hospitalizations, surgeries, and ER visits in previous 12 months . Vitals . Screenings to include cognitive, depression, and falls . Referrals and appointments  In addition, I have reviewed and discussed with patient certain preventive protocols, quality metrics, and best practice recommendations. A written personalized care plan for preventive services as well as general preventive health  recommendations were provided to patient.     Shela Nevin, [place]  12/06/2019

## 2019-12-06 ENCOUNTER — Ambulatory Visit (INDEPENDENT_AMBULATORY_CARE_PROVIDER_SITE_OTHER): Payer: Medicare HMO | Admitting: *Deleted

## 2019-12-06 ENCOUNTER — Encounter: Payer: Self-pay | Admitting: *Deleted

## 2019-12-06 VITALS — BP 151/82

## 2019-12-06 DIAGNOSIS — Z0000 Encounter for general adult medical examination without abnormal findings: Secondary | ICD-10-CM | POA: Diagnosis not present

## 2019-12-06 DIAGNOSIS — M79671 Pain in right foot: Secondary | ICD-10-CM | POA: Diagnosis not present

## 2019-12-06 DIAGNOSIS — M79672 Pain in left foot: Secondary | ICD-10-CM | POA: Diagnosis not present

## 2019-12-06 DIAGNOSIS — L84 Corns and callosities: Secondary | ICD-10-CM | POA: Diagnosis not present

## 2019-12-06 DIAGNOSIS — B351 Tinea unguium: Secondary | ICD-10-CM | POA: Diagnosis not present

## 2019-12-06 NOTE — Patient Instructions (Signed)
Please schedule your next medicare wellness visit with me in 1 yr.  Continue to eat heart healthy diet (full of fruits, vegetables, whole grains, lean protein, water--limit salt, fat, and sugar intake) and increase physical activity as tolerated.  Continue doing brain stimulating activities (puzzles, reading, adult coloring books, staying active) to keep memory sharp.   Bring a copy of your living will and/or healthcare power of attorney to your next office visit.   Kimberly Rodriguez , Thank you for taking time to come for your Medicare Wellness Visit. I appreciate your ongoing commitment to your health goals. Please review the following plan we discussed and let me know if I can assist you in the future.   These are the goals we discussed: Goals      Chronic Care Management Pharmacy Care Plan      CARE PLAN ENTRY  Current Barriers:   Chronic Disease Management support, education, and care coordination needs related to Hypertension, Osteoporosis, GERD, Ankle Pain   Hypertension  Pharmacist Clinical Goal(s): o Over the next 90 days, patient will work with PharmD and providers to maintain BP goal <130/80  Current regimen:  o Diet and exercise management    Patient self care activities - Over the next 90 days, patient will: o Check BP 1-2 times per week, document, and provide at future appointments o Ensure daily salt intake < 2300 mg/day  Osteoporosis  Pharmacist Clinical Goal(s) o Over the next 90 days, patient will work with PharmD and providers to reduce risk of fracture due to osteoporosis  Current regimen:  o Prolia 58m/mL every 6 months, calcium carbonate 6387m vitamin D 1000 units daily  Interventions: o Consider completing DEXA at same time as mammogram if agreeable with Dr. PaLarose KellsPatient self care activities - Over the next 90 days, patient will: o Complete DEXA at same time as mammogram if agreeable with Dr. PaLarose KellsMedication management  Pharmacist Clinical  Goal(s): o Over the next 90 days, patient will work with PharmD and providers to maintain optimal medication adherence  Current pharmacy: Mail order  Interventions o Comprehensive medication review performed. o Continue current medication management strategy  Patient self care activities - Over the next 90 days, patient will: o Focus on medication adherence by filling medications appropriately  o Take medications as prescribed o Report any questions or concerns to PharmD and/or provider(s)  Initial goal documentation       DIET - INCREASE WATER INTAKE      Increase physical activity      Do weightbearing exercises       Maintain current healthy lifestyle. (pt-stated)       This is a list of the screening recommended for you and due dates:  Health Maintenance  Topic Date Due   DEXA scan (bone density measurement)  05/26/2019   Flu Shot  01/20/2020   Tetanus Vaccine  12/10/2020   COVID-19 Vaccine  Completed   Pneumonia vaccines  Completed    Preventive Care 65100ears and Older, Female Preventive care refers to lifestyle choices and visits with your health care provider that can promote health and wellness. This includes:  A yearly physical exam. This is also called an annual well check.  Regular dental and eye exams.  Immunizations.  Screening for certain conditions.  Healthy lifestyle choices, such as diet and exercise. What can I expect for my preventive care visit? Physical exam Your health care provider will check:  Height and weight. These may be used  to calculate body mass index (BMI), which is a measurement that tells if you are at a healthy weight.  Heart rate and blood pressure.  Your skin for abnormal spots. Counseling Your health care provider may ask you questions about:  Alcohol, tobacco, and drug use.  Emotional well-being.  Home and relationship well-being.  Sexual activity.  Eating habits.  History of falls.  Memory and  ability to understand (cognition).  Work and work Statistician.  Pregnancy and menstrual history. What immunizations do I need?  Influenza (flu) vaccine  This is recommended every year. Tetanus, diphtheria, and pertussis (Tdap) vaccine  You may need a Td booster every 10 years. Varicella (chickenpox) vaccine  You may need this vaccine if you have not already been vaccinated. Zoster (shingles) vaccine  You may need this after age 35. Pneumococcal conjugate (PCV13) vaccine  One dose is recommended after age 57. Pneumococcal polysaccharide (PPSV23) vaccine  One dose is recommended after age 26. Measles, mumps, and rubella (MMR) vaccine  You may need at least one dose of MMR if you were born in 1957 or later. You may also need a second dose. Meningococcal conjugate (MenACWY) vaccine  You may need this if you have certain conditions. Hepatitis A vaccine  You may need this if you have certain conditions or if you travel or work in places where you may be exposed to hepatitis A. Hepatitis B vaccine  You may need this if you have certain conditions or if you travel or work in places where you may be exposed to hepatitis B. Haemophilus influenzae type b (Hib) vaccine  You may need this if you have certain conditions. You may receive vaccines as individual doses or as more than one vaccine together in one shot (combination vaccines). Talk with your health care provider about the risks and benefits of combination vaccines. What tests do I need? Blood tests  Lipid and cholesterol levels. These may be checked every 5 years, or more frequently depending on your overall health.  Hepatitis C test.  Hepatitis B test. Screening  Lung cancer screening. You may have this screening every year starting at age 77 if you have a 30-pack-year history of smoking and currently smoke or have quit within the past 15 years.  Colorectal cancer screening. All adults should have this screening  starting at age 17 and continuing until age 91. Your health care provider may recommend screening at age 65 if you are at increased risk. You will have tests every 1-10 years, depending on your results and the type of screening test.  Diabetes screening. This is done by checking your blood sugar (glucose) after you have not eaten for a while (fasting). You may have this done every 1-3 years.  Mammogram. This may be done every 1-2 years. Talk with your health care provider about how often you should have regular mammograms.  BRCA-related cancer screening. This may be done if you have a family history of breast, ovarian, tubal, or peritoneal cancers. Other tests  Sexually transmitted disease (STD) testing.  Bone density scan. This is done to screen for osteoporosis. You may have this done starting at age 58. Follow these instructions at home: Eating and drinking  Eat a diet that includes fresh fruits and vegetables, whole grains, lean protein, and low-fat dairy products. Limit your intake of foods with high amounts of sugar, saturated fats, and salt.  Take vitamin and mineral supplements as recommended by your health care provider.  Do not drink alcohol if  your health care provider tells you not to drink.  If you drink alcohol: ? Limit how much you have to 0-1 drink a day. ? Be aware of how much alcohol is in your drink. In the [place] one drink equals one 12 oz bottle of beer (355 mL), one 5 oz glass of wine (148 mL), or one 1 oz glass of hard liquor (44 mL). Lifestyle  Take daily care of your teeth and gums.  Stay active. Exercise for at least 30 minutes on 5 or more days each week.  Do not use any products that contain nicotine or tobacco, such as cigarettes, e-cigarettes, and chewing tobacco. If you need help quitting, ask your health care provider.  If you are sexually active, practice safe sex. Use a condom or other form of protection in order to prevent STIs (sexually transmitted  infections).  Talk with your health care provider about taking a low-dose aspirin or statin. What's next?  Go to your health care provider once a year for a well check visit.  Ask your health care provider how often you should have your eyes and teeth checked.  Stay up to date on all vaccines. This information is not intended to replace advice given to you by your health care provider. Make sure you discuss any questions you have with your health care provider. Document Revised: 06/01/2018 Document Reviewed: 06/01/2018 Elsevier Patient Education  2020 [employer].

## 2019-12-24 ENCOUNTER — Other Ambulatory Visit: Payer: Self-pay | Admitting: Internal Medicine

## 2020-02-08 ENCOUNTER — Telehealth: Payer: Medicare HMO

## 2020-03-03 ENCOUNTER — Other Ambulatory Visit: Payer: Self-pay

## 2020-03-03 ENCOUNTER — Ambulatory Visit (INDEPENDENT_AMBULATORY_CARE_PROVIDER_SITE_OTHER): Payer: Medicare HMO | Admitting: Internal Medicine

## 2020-03-03 ENCOUNTER — Encounter: Payer: Self-pay | Admitting: Internal Medicine

## 2020-03-03 VITALS — BP 144/86 | HR 74 | Temp 98.0°F | Resp 18 | Ht 60.0 in | Wt 158.1 lb

## 2020-03-03 DIAGNOSIS — R269 Unspecified abnormalities of gait and mobility: Secondary | ICD-10-CM | POA: Diagnosis not present

## 2020-03-03 DIAGNOSIS — M810 Age-related osteoporosis without current pathological fracture: Secondary | ICD-10-CM | POA: Diagnosis not present

## 2020-03-03 DIAGNOSIS — Z7189 Other specified counseling: Secondary | ICD-10-CM

## 2020-03-03 DIAGNOSIS — R296 Repeated falls: Secondary | ICD-10-CM

## 2020-03-03 NOTE — Patient Instructions (Addendum)
We are referring you to physical therapy  Use a cane consistently  You may like to acquire a life alert system  Next prolia by 04-2020  Please complete on return the MOST  form as well as your living will  Get a flu shot this fall  GO TO THE LAB : Get the blood work     [place], [place] back for a checkup in 6 months

## 2020-03-03 NOTE — Progress Notes (Signed)
Subjective:    Patient ID: Kimberly Rodriguez, female    DOB: [birth year], 86 y.o.   MRN: [id number]  DOS:  03/03/2020 Type of visit - description: Routine visit Since the last office visit she is stable. She is concerned about falls, she feels that her legs are somewhat weak sometimes.  Fortunately no actual fall so far    Review of Systems Denies chest pain no difficulty breathing. No presyncope type of feeling or dizziness. No edema   Past Medical History:  Diagnosis Date  . Anemia   . Blood transfusion without reported diagnosis   . Cataract   . Closed left ankle fracture 01/2019   no surgery  . Eustachian tube dysfunction    Right side, Dr. Laurance Flatten, ENT  . Microhematuria    s/p eval by urology 2008: U/S, CT, cysto w/ neg Bx (dx w/ a renal cyst, see reports)  . Osteoarthritis    h/o back pain, sees ortho s/p shots  . Osteoporosis     Past Surgical History:  Procedure Laterality Date  . ABDOMINAL HYSTERECTOMY     no oophorectomy per pt  . APPENDECTOMY    . COLONOSCOPY    . DILATION AND CURETTAGE OF UTERUS    . TONSILLECTOMY      Allergies as of 03/03/2020      Reactions   Astelin [azelastine Hcl] Palpitations      Medication List       Accurate as of March 03, 2020  3:27 PM. If you have any questions, ask your nurse or doctor.        CALTRATE 600 PO Take by mouth.   Centrum Silver Chew Chew by mouth.   denosumab 60 MG/ML Soln injection Commonly known as: PROLIA Inject 60 mg into the skin every 6 (six) months. Administer in upper arm, thigh, or abdomen   FIBER PO Take by mouth.   fluticasone 50 MCG/ACT nasal spray Commonly known as: FLONASE Place 2 sprays into both nostrils daily as needed.   meloxicam 7.5 MG tablet Commonly known as: MOBIC Take 1 tablet (7.5 mg total) by mouth daily as needed for pain.   pantoprazole 20 MG tablet Commonly known as: PROTONIX Take 1 tablet (20 mg total) by mouth daily before breakfast.   STOOL SOFTENER  PO Take by mouth.          Objective:   Physical Exam BP (!) 144/86 (BP Location: Left Arm, Patient Position: Sitting, Cuff Size: Small)   Pulse 74   Temp 98 F (36.7 C) (Oral)   Resp 18   Ht 5' (1.524 m)   Wt 158 lb 2 oz (71.7 kg)   SpO2 95%   BMI 30.88 kg/m  General:   Well developed, NAD, BMI noted. HEENT:  Normocephalic . Face symmetric, atraumatic Lungs:  CTA B Normal respiratory effort, no intercostal retractions, no accessory muscle use. Heart: RRR with occasional irregular beat, + systolic murmur noted.  Lower extremities: no pretibial edema bilaterally  Skin: Not pale. Not jaundice Neurologic:  alert & oriented X3.  Speech normal, gait appropriate for age and unassisted Psych--  Cognition and judgment appear intact.  Cooperative with normal attention span and concentration.  Behavior appropriate. No anxious or depressed appearing.      Assessment     Assessment interstitial lung dz per chest x-ray 08-2014, PFTs 08-2014: minimal obstruction MSK: --DJD --Neck pain --Back pain: h/o acupuncture before Osteoporosis:  T score 04-2015 -3.1, prolia #2  11-2015  T score -  3.4 (10/2016), saw Dr. Loanne Drilling, work-up for secondary etiologies negative, rx Prolia 03/2018 Microhematuria  s/p eval by urology 2008: U/S, CT, cysto w/ neg Bx (dx w/ a renal cyst, see reports) ET dysfuntion saw ENT Dr Laurance Flatten 2016 Anemia, saw GI: 06-2016: Cscope: polyps, tubular adenoma; EGD: cameron lesions, no Bx H/o Anxiety Murmur: Echo 09/2019: Mild to moderate MR and AS   PLAN Risk of fall/ gait d/o: The patient feels wobbly, refer to PT for leg strengthening.  Recommend to use a cane.  She lives by herself, recommend to get a life alert type of system. Mild to moderate MR and AS: per echo, no sxs. Osteoporosis: On Prolia, next 04-2020.  Checking CMP, CBC. Advance care planning: She lives by herself, no family in town, she does have a sister and we have her contact number. The patient  reports she has a living will, encouraged to bring the copy, attempted to complete a MOST document, she is not sure about how to answer all the questionsso a copy was given she will think about it and bring it to the office. RTC 6 months   This visit occurred during the SARS-CoV-2 public health emergency.  Safety protocols were in place, including screening questions prior to the visit, additional usage of staff PPE, and extensive cleaning of exam room while observing appropriate contact time as indicated for disinfecting solutions.

## 2020-03-03 NOTE — Progress Notes (Signed)
Pre visit review using our clinic review tool, if applicable. No additional management support is needed unless otherwise documented below in the visit note. 

## 2020-03-04 ENCOUNTER — Telehealth: Payer: Self-pay | Admitting: Internal Medicine

## 2020-03-04 NOTE — Assessment & Plan Note (Signed)
Risk of fall/ gait d/o: The patient feels wobbly, refer to PT for leg strengthening.  Recommend to use a cane.  She lives by herself, recommend to get a life alert type of system. Mild to moderate MR and AS: per echo, no sxs. Osteoporosis: On Prolia, next 04-2020.  Checking CMP, CBC. Advance care planning: She lives by herself, no family in town, she does have a sister and we have her contact number. The patient reports she has a living will, encouraged to bring the copy, attempted to complete a MOST document, she is not sure about how to answer all the questionsso a copy was given she will think about it and bring it to the office. RTC 6 months

## 2020-03-04 NOTE — Telephone Encounter (Signed)
Form placed in PCP red folder for review.

## 2020-03-04 NOTE — Telephone Encounter (Signed)
Pt dropped of power off attorney for Larose Kells to have a copy of. Paperwork put into [employer]	[place] at [employer]

## 2020-03-05 NOTE — Telephone Encounter (Signed)
Healthcare power of attorney and MOST form sent to scanning

## 2020-03-10 ENCOUNTER — Telehealth: Payer: Self-pay | Admitting: Pharmacist

## 2020-03-10 NOTE — Progress Notes (Addendum)
Chronic Care Management Pharmacy Assistant   Name: Kimberly Rodriguez  MRN: [id number] DOB: 1927-06-13  Reason for Encounter: Disease State  Patient Questions:  1.  Have you seen any other providers since your last visit? es  2.  Any changes in your medicines or health? No    PCP : Colon Branch, MD   Their chronic conditions include: Hypertension, Osteoporosis, GERD, Ankle Pain  Office visit: 03-03-20 (PCP) Patient presented in office with [name] for a routine visit.  Provider states since the last office visit patient was stable.  Patient stated her concerns about falling, patient stated she feels that her legs are somewhat weak sometimes.  Provider stated no actual fall so far.  Provider referred patient to physical therapy and recommended patient use of a cane consistently. 12-06-19 (PCP) Patient is presented in office with Kimberly Rodriguez for [facility] Annual (Subsequent) preventive examination.  No medication changes.  No Consults or Hospitalizations since last CCM visit on 11-09-19.   Allergies:   Allergies  Allergen Reactions   Astelin [Azelastine Hcl] Palpitations    Medications: Outpatient Encounter Medications as of 03/10/2020  Medication Sig Note   Calcium Carbonate (CALTRATE 600 PO) Take by mouth.      denosumab (PROLIA) 60 MG/ML SOLN injection Inject 60 mg into the skin every 6 (six) months. Administer in upper arm, thigh, or abdomen 03/03/2020: Last 10/2019   Docusate Calcium (STOOL SOFTENER PO) Take by mouth.    FIBER PO Take by mouth.      fluticasone (FLONASE) 50 MCG/ACT nasal spray Place 2 sprays into both nostrils daily as needed.  12/02/2015: PRN   meloxicam (MOBIC) 7.5 MG tablet Take 1 tablet (7.5 mg total) by mouth daily as needed for pain. 03/03/2020: PRN   Multiple Vitamins-Minerals (CENTRUM SILVER) CHEW Chew by mouth.      pantoprazole (PROTONIX) 20 MG tablet Take 1 tablet (20 mg total) by mouth daily before breakfast.    No facility-administered  encounter medications on file as of 03/10/2020.    Current Diagnosis: Patient Active Problem List   Diagnosis Date Noted   Closed nondisplaced fracture of lateral malleolus of left fibula 01/23/2019   Closed left ankle fracture 01/2019   PCP NOTES >>>>>>>>>>>>>>>>>>>>> 04/01/2015   Lung disease--diffuse interstitial lung disease by x-ray 08-2014 09/05/2014   Anxiety 02/29/2012   Annual physical exam 12/11/2010   DJD (degenerative joint disease) 10/24/2009   Osteoporosis 06/05/2009    Goals Addressed   None    Reviewed chart prior to disease state call. Spoke with patient regarding BP  Recent Office Vitals: BP Readings from Last 3 Encounters:  03/03/20 (!) 144/86  12/06/19 (!) 151/82  10/15/19 (!) 155/72   Pulse Readings from Last 3 Encounters:  03/03/20 74  10/15/19 67  10/11/19 72    Wt Readings from Last 3 Encounters:  03/03/20 158 lb 2 oz (71.7 kg)  10/15/19 160 lb (72.6 kg)  10/11/19 160 lb (72.6 kg)     Kidney Function Lab Results  Component Value Date/Time   CREATININE 0.70 05/15/2019 10:31 AM   CREATININE 0.73 09/18/2018 02:28 PM   GFR 78.57 05/15/2019 10:31 AM   GFRNONAA 78 (L) 09/02/2014 08:45 PM   GFRAA 90 (L) 09/02/2014 08:45 PM    BMP Latest Ref Rng & Units 05/15/2019 09/18/2018 07/12/2017  Glucose 70 - 99 mg/dL 86 110(H) -  BUN 6 - 23 mg/dL 17 21 -  Creatinine 0.40 - 1.20 mg/dL 0.70 0.73 -  Sodium  135 - 145 mEq/L 137 139 -  Potassium 3.5 - 5.1 mEq/L 4.4 4.5 -  Chloride 96 - 112 mEq/L 101 103 -  CO2 19 - 32 mEq/L 32 29 -  Calcium 8.4 - 10.5 mg/dL 9.4 9.2 9.7     Current antihypertensive regimen:  ? Diet and exercise management    How often are you checking your Blood Pressure? daily In the mornings  Current home BP readings:   145/76  Pulse:56 on 03-10-20  138/72 Pulse: 66 on 03-09-20  130/80 Pulse: 59 on 03-08-20  143/82 Pulse: 63 on 03-07-20  140/81 Pulse: 63 on 03-06-20  151/72 Pulse: 67 on 03-05-20  132/79 Pulse: 67 on  5-85-92  Systolic Avg: 924.4  What recent interventions/DTPs have been made by any provider to improve Blood Pressure control since last CPP Visit: None  Any recent hospitalizations or ED visits since last visit with CPP? No  What diet changes have been made to improve Blood Pressure Control?  o Patient states she is not snacking on salt foods as much.  States she is trying to stay way from dark chocolate also.  What exercise is being done to improve your Blood Pressure Control?  o Patient states she does clean her house.  She does walk to the dumpster. Patient states she has a referral to physical therapy starting 03-11-20.  Adherence Review: Is the patient currently on ACE/ARB medication? No Does the patient have >5 day gap between last estimated fill dates? No     Follow-Up:  Pharmacist Review   [place] Shannon, [place] [facility] [place] Pharmacist Assistant [phone]  Reviewed by: De Blanch, PharmD Clinical Pharmacist [place] [facility] [phone]

## 2020-03-11 DIAGNOSIS — R296 Repeated falls: Secondary | ICD-10-CM | POA: Diagnosis not present

## 2020-03-11 DIAGNOSIS — R269 Unspecified abnormalities of gait and mobility: Secondary | ICD-10-CM | POA: Diagnosis not present

## 2020-03-14 ENCOUNTER — Ambulatory Visit: Payer: Medicare HMO | Admitting: Internal Medicine

## 2020-03-18 DIAGNOSIS — R296 Repeated falls: Secondary | ICD-10-CM | POA: Diagnosis not present

## 2020-03-18 DIAGNOSIS — R269 Unspecified abnormalities of gait and mobility: Secondary | ICD-10-CM | POA: Diagnosis not present

## 2020-03-24 ENCOUNTER — Telehealth: Payer: Self-pay

## 2020-03-24 NOTE — Telephone Encounter (Signed)
Plan of care received from [facility]. Form signed and faxed back to [phone]. Form sent for scanning.

## 2020-03-25 DIAGNOSIS — R269 Unspecified abnormalities of gait and mobility: Secondary | ICD-10-CM | POA: Diagnosis not present

## 2020-03-25 DIAGNOSIS — R296 Repeated falls: Secondary | ICD-10-CM | POA: Diagnosis not present

## 2020-03-26 ENCOUNTER — Telehealth: Payer: Self-pay | Admitting: Pharmacist

## 2020-03-26 ENCOUNTER — Ambulatory Visit (INDEPENDENT_AMBULATORY_CARE_PROVIDER_SITE_OTHER): Payer: Medicare HMO | Admitting: Medical

## 2020-03-26 ENCOUNTER — Other Ambulatory Visit: Payer: Self-pay

## 2020-03-26 ENCOUNTER — Other Ambulatory Visit (HOSPITAL_BASED_OUTPATIENT_CLINIC_OR_DEPARTMENT_OTHER): Payer: Self-pay | Admitting: Medical

## 2020-03-26 VITALS — BP 139/74 | HR 82 | Resp 18 | Ht 61.0 in | Wt 160.4 lb

## 2020-03-26 DIAGNOSIS — T7840XA Allergy, unspecified, initial encounter: Secondary | ICD-10-CM | POA: Diagnosis not present

## 2020-03-26 MED ORDER — PREDNISONE 10 MG (21) PO TBPK: ORAL_TABLET | ORAL | 0 refills | Status: DC

## 2020-03-26 MED ORDER — METHYLPREDNISOLONE ACETATE 40 MG/ML IJ SUSP
40.0000 mg | Freq: Once | INTRAMUSCULAR | Status: AC
  Administered 2020-03-26: 40 mg via INTRAMUSCULAR

## 2020-03-26 MED FILL — predniSONE 10 MG TABS: 10 | 6 days supply | Qty: 21 | Fill #0

## 2020-03-26 NOTE — Addendum Note (Signed)
Addended by: Jeronimo Greaves on: 03/26/2020 03:08 PM   Modules accepted: Orders

## 2020-03-26 NOTE — Patient Instructions (Signed)
You recently have allergic reaction to poison ivy. We gave you depo-medrol im injection. I am also prescribing oral taper prednisone and benadryl for itching at night if needed. Your rash should gradually improve. If worsening or expanding please notify us.  Follow up in 7 days or as needed.

## 2020-03-26 NOTE — Progress Notes (Addendum)
Chronic Care Management Pharmacy Assistant   Name: Kimberly Rodriguez  MRN: [id number] DOB: 09/09/1926  Reason for Encounter: Disease State  Patient Questions:  1.  Have you seen any other providers since your last visit? Yes  2.  Any changes in your medicines or health? No    PCP : Colon Branch, MD   Their chronic conditions include: Hypertension, Osteoporosis, GERD, Ankle Pain  Office Visit: 12-06-19(PCP) Patient presented in office with [facility] for a follow up.  Provider referred patient to physical therapy Consults: 12-06-19(Podiatry)  Patient is presented in office with Kimberly Rodriguez for bilateral foot pain.  No medication changes  No Hospitalizations since last CCM visit on 11-09-19.  Allergies:   Allergies  Allergen Reactions  . Astelin [Azelastine Hcl] Palpitations    Medications: Outpatient Encounter Medications as of 03/26/2020  Medication Sig Note  . Calcium Carbonate (CALTRATE 600 PO) Take by mouth.     . denosumab (PROLIA) 60 MG/ML SOLN injection Inject 60 mg into the skin every 6 (six) months. Administer in upper arm, thigh, or abdomen 03/03/2020: Last 10/2019  . Docusate Calcium (STOOL SOFTENER PO) Take by mouth.   . FIBER PO Take by mouth.     . fluticasone (FLONASE) 50 MCG/ACT nasal spray Place 2 sprays into both nostrils daily as needed.  12/02/2015: PRN  . meloxicam (MOBIC) 7.5 MG tablet Take 1 tablet (7.5 mg total) by mouth daily as needed for pain. (Patient not taking: Reported on 03/26/2020) 03/03/2020: PRN  . Multiple Vitamins-Minerals (CENTRUM SILVER) CHEW Chew by mouth.     . pantoprazole (PROTONIX) 20 MG tablet Take 1 tablet (20 mg total) by mouth daily before breakfast.   . predniSONE (STERAPRED UNI-PAK 21 TAB) 10 MG (21) TBPK tablet Standard Taper over 6 days.    No facility-administered encounter medications on file as of 03/26/2020.    Current Diagnosis: Patient Active Problem List   Diagnosis Date Noted  . Closed nondisplaced fracture of lateral  malleolus of left fibula 01/23/2019  . Closed left ankle fracture 01/2019  . PCP NOTES >>>>>>>>>>>>>>>>>>>>> 04/01/2015  . Lung disease--diffuse interstitial lung disease by x-ray 08-2014 09/05/2014  . Anxiety 02/29/2012  . Annual physical exam 12/11/2010  . DJD (degenerative joint disease) 10/24/2009  . Osteoporosis 06/05/2009    Goals Addressed   None    Reviewed chart prior to disease state call. Spoke with patient regarding BP  Recent Office Vitals: BP Readings from Last 3 Encounters:  03/26/20 139/74  03/03/20 (!) 144/86  12/06/19 (!) 151/82   Pulse Readings from Last 3 Encounters:  03/26/20 82  03/03/20 74  10/15/19 67    Wt Readings from Last 3 Encounters:  03/26/20 160 lb 6.4 oz (72.8 kg)  03/03/20 158 lb 2 oz (71.7 kg)  10/15/19 160 lb (72.6 kg)     Kidney Function Lab Results  Component Value Date/Time   CREATININE 0.70 05/15/2019 10:31 AM   CREATININE 0.73 09/18/2018 02:28 PM   GFR 78.57 05/15/2019 10:31 AM   GFRNONAA 78 (L) 09/02/2014 08:45 PM   GFRAA 90 (L) 09/02/2014 08:45 PM    BMP Latest Ref Rng & Units 05/15/2019 09/18/2018 07/12/2017  Glucose 70 - 99 mg/dL 86 110(H) -  BUN 6 - 23 mg/dL 17 21 -  Creatinine 0.40 - 1.20 mg/dL 0.70 0.73 -  Sodium 135 - 145 mEq/L 137 139 -  Potassium 3.5 - 5.1 mEq/L 4.4 4.5 -  Chloride 96 - 112 mEq/L 101 103 -  CO2 19 - 32 mEq/L 32 29 -  Calcium 8.4 - 10.5 mg/dL 9.4 9.2 9.7    . Current antihypertensive regimen:  ? Diet and exercise management  . How often are you checking your Blood Pressure? infrequently . Current home BP readings: None Did have PCP visit as of 03-26-20. . What recent interventions/DTPs have been made by any provider to improve Blood Pressure control since last CPP Visit: None . Any recent hospitalizations or ED visits since last visit with CPP? No . What diet changes have been made to improve Blood Pressure Control?  o Reports she eats ok. . What exercise is being done to improve your Blood  Pressure Control?  o States she is going to physical therapy.  States she only she does physical therapy once a week for two hours.   Adherence Review: Is the patient currently on ACE/ARB medication? No Does the patient have >5 day gap between last estimated fill dates? No   Follow-Up:  Pharmacist Review   [place] Shannon, [place] [facility] [place] Pharmacist Assistant [phone]  Reviewed by: De Blanch, PharmD Clinical Pharmacist [place] [facility] [phone]

## 2020-03-26 NOTE — Progress Notes (Signed)
Subjective:    Patient ID: Kimberly Rodriguez, female    DOB: September 04, 1926, 86 y.o.   MRN: [id number]  HPI Pt in for evaluation for rash. She was pulling weeds around her house. She has hx of allergic reaction to poison ivy through out her life. Degree of reaction has been moderate to severe at times in the past.  Rash started on her hands. Now present on left shoulder, neck,forearms  and chin  No history of diabetes.  Pt has moderate itching. No lip or tongue swelling. No sob or wheezing.   Review of Systems  Constitutional: Negative for chills, fatigue and fever.  Respiratory: Negative for cough, chest tightness, shortness of breath and wheezing.   Cardiovascular: Negative for chest pain and palpitations.  Gastrointestinal: Negative for abdominal pain and blood in stool.  Skin: Positive for rash.  Psychiatric/Behavioral: Negative for behavioral problems, confusion and sleep disturbance. The patient is not nervous/anxious.    Past Medical History:  Diagnosis Date  . Anemia   . Blood transfusion without reported diagnosis   . Cataract   . Closed left ankle fracture 01/2019   no surgery  . Eustachian tube dysfunction    Right side, Dr. Laurance Flatten, ENT  . Microhematuria    s/p eval by urology 2008: U/S, CT, cysto w/ neg Bx (dx w/ a renal cyst, see reports)  . Osteoarthritis    h/o back pain, sees ortho s/p shots  . Osteoporosis      Social History   Socioeconomic History  . Marital status: Widowed    Spouse name: Not on file  . Number of children: 0  . Years of education: Not on file  . Highest education level: Not on file  Occupational History  . Occupation: retired  Tobacco Use  . Smoking status: Former Research scientist (life sciences)  . Smokeless tobacco: Never Used  Substance and Sexual Activity  . Alcohol use: Yes    Comment: socially -wine  . Drug use: No  . Sexual activity: Not on file  Other Topics Concern  . Not on file  Social History Narrative   Household -- lives by herself in a  townhouse    Still drives    Emergency contact-- [name] (sister in law) lives in [place] --[phone]   She is her health POA   Social Determinants of Health   Financial Resource Strain: Low Risk   . Difficulty of Paying Living Expenses: Not hard at all  Food Insecurity: No Food Insecurity  . Worried About Charity fundraiser in the Last Year: Never true  . Ran Out of Food in the Last Year: Never true  Transportation Needs: No Transportation Needs  . Lack of Transportation (Medical): No  . Lack of Transportation (Non-Medical): No  Physical Activity:   . Days of Exercise per Week: Not on file  . Minutes of Exercise per Session: Not on file  Stress:   . Feeling of Stress : Not on file  Social Connections:   . Frequency of Communication with Friends and Family: Not on file  . Frequency of Social Gatherings with Friends and Family: Not on file  . Attends Religious Services: Not on file  . Active Member of Clubs or Organizations: Not on file  . Attends Archivist Meetings: Not on file  . Marital Status: Not on file  Intimate Partner Violence:   . Fear of Current or Ex-Partner: Not on file  . Emotionally Abused: Not on file  . Physically  Abused: Not on file  . Sexually Abused: Not on file    Past Surgical History:  Procedure Laterality Date  . ABDOMINAL HYSTERECTOMY     no oophorectomy per pt  . APPENDECTOMY    . COLONOSCOPY    . DILATION AND CURETTAGE OF UTERUS    . TONSILLECTOMY      Family History  Problem Relation Age of Onset  . Heart disease Father   . Prostate cancer Father   . Heart disease Mother   . Colon cancer Neg Hx   . Breast cancer Neg Hx   . Diabetes Neg Hx   . Osteoporosis Neg Hx     Allergies  Allergen Reactions  . Astelin [Azelastine Hcl] Palpitations    Current Outpatient Medications on File Prior to Visit  Medication Sig Dispense Refill  . Calcium Carbonate (CALTRATE 600 PO) Take by mouth.      . denosumab (PROLIA) 60 MG/ML SOLN  injection Inject 60 mg into the skin every 6 (six) months. Administer in upper arm, thigh, or abdomen    . Docusate Calcium (STOOL SOFTENER PO) Take by mouth.    . FIBER PO Take by mouth.      . fluticasone (FLONASE) 50 MCG/ACT nasal spray Place 2 sprays into both nostrils daily as needed.     . Multiple Vitamins-Minerals (CENTRUM SILVER) CHEW Chew by mouth.      . pantoprazole (PROTONIX) 20 MG tablet Take 1 tablet (20 mg total) by mouth daily before breakfast. 90 tablet 3  . meloxicam (MOBIC) 7.5 MG tablet Take 1 tablet (7.5 mg total) by mouth daily as needed for pain. (Patient not taking: Reported on 03/26/2020) 90 tablet 0   No current facility-administered medications on file prior to visit.    BP 139/74   Pulse 82   Resp 18   Ht 5\' 1"  (1.549 m)   Wt 160 lb 6.4 oz (72.8 kg)   SpO2 98%   BMI 30.31 kg/m       Objective:   Physical Exam  General Mental Status- Alert. General Appearance- Not in acute distress.   Skin General: Color- Normal Color. Moisture- Normal Moisture.  Neck Carotid Arteries- Normal color. Moisture- Normal Moisture. No carotid bruits. No JVD.  Chest and Lung Exam Auscultation: Breath Sounds:-Normal.  Cardiovascular Auscultation:Rythm- Regular. Murmurs & Other Heart Sounds:Auscultation of the heart reveals- No Murmurs.  Neurologic Cranial Nerve exam:- CN III-XII intact(No nystagmus), symmetric smile. Strength:- 5/5 equal and symmetric strength both upper and lower extremities.  Skin rash on rt ring finger, middle rt forearm, rt upper bicep area, left shoulder, rt cheek, and chin. Small blisters. seen on rt arm. Also rash on left bicep area    Assessment & Plan:  You recently have allergic reaction to poison ivy. We gave you depo-medrol im injection. I am also prescribing oral taper prednisone and benadryl for itching at night if needed. Your rash should gradually improve. If worsening or expanding please notify us.  Follow up in 7 days or as  needed.  Note due to extensive level of rash/reaction decided on both injection and moderate high dose prednisone. Had rash been minimal would have preferred to rx low dose medrol taper alone  in her age group but more aggressive tx needed. Appears to be in very good shape for 86 yo.

## 2020-04-01 ENCOUNTER — Telehealth: Payer: Self-pay | Admitting: Pharmacist

## 2020-04-01 DIAGNOSIS — R269 Unspecified abnormalities of gait and mobility: Secondary | ICD-10-CM | POA: Diagnosis not present

## 2020-04-01 DIAGNOSIS — R296 Repeated falls: Secondary | ICD-10-CM | POA: Diagnosis not present

## 2020-04-01 NOTE — Progress Notes (Addendum)
      Chronic Care Management Pharmacy Assistant   Name: Kimberly Rodriguez  MRN: [id number] DOB: [birth year]  Reason for Encounter: General Adherence   PCP : Colon Branch, MD  Allergies:   Allergies  Allergen Reactions  . Astelin [Azelastine Hcl] Palpitations    Medications: Outpatient Encounter Medications as of 04/01/2020  Medication Sig Note  . Calcium Carbonate (CALTRATE 600 PO) Take by mouth.     . denosumab (PROLIA) 60 MG/ML SOLN injection Inject 60 mg into the skin every 6 (six) months. Administer in upper arm, thigh, or abdomen 03/03/2020: Last 10/2019  . Docusate Calcium (STOOL SOFTENER PO) Take by mouth.   . FIBER PO Take by mouth.     . fluticasone (FLONASE) 50 MCG/ACT nasal spray Place 2 sprays into both nostrils daily as needed.  12/02/2015: PRN  . meloxicam (MOBIC) 7.5 MG tablet Take 1 tablet (7.5 mg total) by mouth daily as needed for pain. (Patient not taking: Reported on 03/26/2020) 03/03/2020: PRN  . Multiple Vitamins-Minerals (CENTRUM SILVER) CHEW Chew by mouth.     . pantoprazole (PROTONIX) 20 MG tablet Take 1 tablet (20 mg total) by mouth daily before breakfast.   . predniSONE (STERAPRED UNI-PAK 21 TAB) 10 MG (21) TBPK tablet Standard Taper over 6 days.    No facility-administered encounter medications on file as of 04/01/2020.    Current Diagnosis: Patient Active Problem List   Diagnosis Date Noted  . Closed nondisplaced fracture of lateral malleolus of left fibula 01/23/2019  . Closed left ankle fracture 01/2019  . PCP NOTES >>>>>>>>>>>>>>>>>>>>> 04/01/2015  . Lung disease--diffuse interstitial lung disease by x-ray 08-2014 09/05/2014  . Anxiety 02/29/2012  . Annual physical exam 12/11/2010  . DJD (degenerative joint disease) 10/24/2009  . Osteoporosis 06/05/2009    Goals Addressed   None     Follow-Up:  Scheduled Follow-Up With Clinical Pharmacist Confirming appointment time for 11-23 at 3pm via telephone.    [place] Shannon, [place] [facility] [place] Pharmacist Assistant [phone]  Reviewed by: De Blanch, PharmD Clinical Pharmacist [facility] [phone]

## 2020-04-08 DIAGNOSIS — L821 Other seborrheic keratosis: Secondary | ICD-10-CM | POA: Diagnosis not present

## 2020-04-08 DIAGNOSIS — L578 Other skin changes due to chronic exposure to nonionizing radiation: Secondary | ICD-10-CM | POA: Diagnosis not present

## 2020-04-08 DIAGNOSIS — L255 Unspecified contact dermatitis due to plants, except food: Secondary | ICD-10-CM | POA: Diagnosis not present

## 2020-04-16 DIAGNOSIS — R296 Repeated falls: Secondary | ICD-10-CM | POA: Diagnosis not present

## 2020-04-16 DIAGNOSIS — R269 Unspecified abnormalities of gait and mobility: Secondary | ICD-10-CM | POA: Diagnosis not present

## 2020-04-18 ENCOUNTER — Telehealth: Payer: Self-pay | Admitting: Pharmacist

## 2020-04-18 NOTE — Progress Notes (Addendum)
Verified Adherence Gap Information. Per insurance data, the patient's last Medicare AWV was 12-06-2019. There is  No A1C recorded. Patient is 100% compliant with the [facility] Wellness Bundle.  Fanny Skates, [place] Pharmacist Assistant [phone]  Reviewed by: De Blanch, PharmD Clinical Pharmacist [facility] [phone]

## 2020-04-25 DIAGNOSIS — R296 Repeated falls: Secondary | ICD-10-CM | POA: Diagnosis not present

## 2020-04-25 DIAGNOSIS — R269 Unspecified abnormalities of gait and mobility: Secondary | ICD-10-CM | POA: Diagnosis not present

## 2020-05-02 DIAGNOSIS — R269 Unspecified abnormalities of gait and mobility: Secondary | ICD-10-CM | POA: Diagnosis not present

## 2020-05-02 DIAGNOSIS — R296 Repeated falls: Secondary | ICD-10-CM | POA: Diagnosis not present

## 2020-05-06 ENCOUNTER — Ambulatory Visit: Payer: Medicare HMO

## 2020-05-06 DIAGNOSIS — R296 Repeated falls: Secondary | ICD-10-CM | POA: Diagnosis not present

## 2020-05-06 DIAGNOSIS — R269 Unspecified abnormalities of gait and mobility: Secondary | ICD-10-CM | POA: Diagnosis not present

## 2020-05-13 ENCOUNTER — Ambulatory Visit: Payer: Medicare HMO | Admitting: Pharmacist

## 2020-05-13 DIAGNOSIS — R296 Repeated falls: Secondary | ICD-10-CM | POA: Diagnosis not present

## 2020-05-13 DIAGNOSIS — R030 Elevated blood-pressure reading, without diagnosis of hypertension: Secondary | ICD-10-CM

## 2020-05-13 DIAGNOSIS — M810 Age-related osteoporosis without current pathological fracture: Secondary | ICD-10-CM

## 2020-05-13 DIAGNOSIS — R269 Unspecified abnormalities of gait and mobility: Secondary | ICD-10-CM | POA: Diagnosis not present

## 2020-05-13 NOTE — Chronic Care Management (AMB) (Signed)
Chronic Care Management Pharmacy  Name: Kimberly Rodriguez  MRN: [id number] DOB: 04/16/1927  Chief Complaint/ HPI  Kimberly Rodriguez,  86 y.o. , female presents for their Follow-Up CCM visit with the clinical pharmacist via telephone due to COVID-19 Pandemic.  PCP : Colon Branch, MD  Their chronic conditions include: Hypertension, Osteoporosis, GERD, Ankle Pain  Office Visits: 03/26/20: Visit w/ Mackie Pai, PA-C - Poison ivy rash prescribed prednisone and benadryl.  03/03/20: Visit w/ Dr. Larose Kells - Refer to PT for leg strengthening. No med changes noted.   Consult Visit: 03/25/20: PT  Medications: Outpatient Encounter Medications as of 05/13/2020  Medication Sig Note  . Calcium Carbonate (CALTRATE 600 PO) Take by mouth.     . denosumab (PROLIA) 60 MG/ML SOLN injection Inject 60 mg into the skin every 6 (six) months. Administer in upper arm, thigh, or abdomen 03/03/2020: Last 10/2019  . Docusate Calcium (STOOL SOFTENER PO) Take by mouth.   . FIBER PO Take by mouth.     . fluticasone (FLONASE) 50 MCG/ACT nasal spray Place 2 sprays into both nostrils daily as needed.  12/02/2015: PRN  . meloxicam (MOBIC) 7.5 MG tablet Take 1 tablet (7.5 mg total) by mouth daily as needed for pain. (Patient not taking: Reported on 03/26/2020) 03/03/2020: PRN  . Multiple Vitamins-Minerals (CENTRUM SILVER) CHEW Chew by mouth.     . pantoprazole (PROTONIX) 20 MG tablet Take 1 tablet (20 mg total) by mouth daily before breakfast.   . predniSONE (STERAPRED UNI-PAK 21 TAB) 10 MG (21) TBPK tablet Standard Taper over 6 days.    No facility-administered encounter medications on file as of 05/13/2020.     Current Diagnosis/Assessment:  Goals Addressed            This Visit's Progress   . Chronic Care Management Pharmacy Care Plan       CARE PLAN ENTRY (see longitudinal plan of care for additional care plan information)  Current Barriers:  . Chronic Disease Management support, education, and care  coordination needs related to Hypertension, Osteoporosis, GERD, Ankle Pain   Hypertension BP Readings from Last 3 Encounters:  03/26/20 139/74  03/03/20 (!) 144/86  12/06/19 (!) 151/82   . Pharmacist Clinical Goal(s): o Over the next 90 days, patient will work with PharmD and providers to maintain BP goal <140/90 . Current regimen:  o Diet and exercise management   . Patient self care activities - Over the next 90 days, patient will: o Check BP 1-2 times per week, document, and provide at future appointments o Ensure daily salt intake < 2300 mg/Kimberly Rodriguez  Osteoporosis . Pharmacist Clinical Goal(s) o Over the next 90 days, patient will work with PharmD and providers to reduce risk of fracture due to osteoporosis . Current regimen:  o Prolia 60mg /mL every 6 months o Calcium carbonate 630mg  o Vitamin D 1000 units daily . Interventions: o Consider completing DEXA at same time as mammogram if agreeable with Dr. Larose Kells . Patient self care activities - Over the next 90 days, patient will: o Complete DEXA at same time as mammogram if agreeable with Dr. Larose Kells  Health Maintenance  . Pharmacist Clinical Goal(s) o Over the next 90 days, patient will work with PharmD and providers to complete health maintenance screenings/vaccinations . Interventions: o Discussed USPSTF recommendations for mammograms.  o Pt can stop receiving mammograms pending Dr. Larose Kells approval . Patient self care activities - Over the next 90 days, patient will: o Maintain necessary health maintenance  Medication management . Pharmacist Clinical Goal(s): o Over the next 90 days, patient will work with PharmD and providers to maintain optimal medication adherence . Current pharmacy: Mail order . Interventions o Comprehensive medication review performed. o Continue current medication management strategy . Patient self care activities - Over the next 90 days, patient will: o Focus on medication adherence by filling medications  appropriately  o Take medications as prescribed o Report any questions or concerns to PharmD and/or provider(s)  Please see past updates related to this goal by clicking on the "Past Updates" button in the selected goal        Social Hx:  Originally from [place].  Came here due to her husband's sister living.  Widowed for 7 years. No children. No  Pets. Neighbors have children.   Update 05/13/20 PT ends in December  Hypertension   Blood pressure goal <140/90  CMP Latest Ref Rng & Units 05/15/2019 09/18/2018 07/12/2017  Glucose 70 - 99 mg/dL 86 110(H) -  BUN 6 - 23 mg/dL 17 21 -  Creatinine 0.40 - 1.20 mg/dL 0.70 0.73 -  Sodium 135 - 145 mEq/L 137 139 -  Potassium 3.5 - 5.1 mEq/L 4.4 4.5 -  Chloride 96 - 112 mEq/L 101 103 -  CO2 19 - 32 mEq/L 32 29 -  Calcium 8.4 - 10.5 mg/dL 9.4 9.2 9.7  Total Protein 6.0 - 8.3 g/dL 6.2 6.1 6.7  Total Bilirubin 0.2 - 1.2 mg/dL 0.5 0.3 -  Alkaline Phos 39 - 117 U/L 78 67 -  AST 0 - 37 U/L 18 18 -  ALT 0 - 35 U/L 14 13 -   Kidney Function Lab Results  Component Value Date/Time   CREATININE 0.70 05/15/2019 10:31 AM   CREATININE 0.73 09/18/2018 02:28 PM   GFR 78.57 05/15/2019 10:31 AM   GFRNONAA 78 (L) 09/02/2014 08:45 PM   GFRAA 90 (L) 09/02/2014 08:45 PM   Office blood pressures are  BP Readings from Last 3 Encounters:  03/26/20 139/74  03/03/20 (!) 144/86  12/06/19 (!) 151/82   Patient has failed these meds in the past: None noted  Patient is currently controlled per home BP although uncontrolled per clinic BP on the following medications: None noted   Patient checks BP at home 1-2x per week  Patient home BP readings are ranging:  126 67 72 145 73 74 141 76 64 134 79 63 122 77 65 136 63 54 128 60 74 150 75 42 129  70 59 Average 134.5 71.1 63  We discussed Possibility of white coat hypertension   Update  05/13/20 149 76 61 145 83 59 122 71 71 147 80 61 148 71 67 143 83 64 131 72 54 154 78 68  Avg 142.3 76.7 63.1  No headaches, chest pains, or dizziness BP average trending up. Will continue to monitor  Plan -Continue current medications    Osteoporosis   Last DEXA Scan: 05/25/2017  T-Score forearm radius: -3.4  T-Score femoral neck: -1.7  VITD  Date Value Ref Range Status  07/12/2017 54.15 30.00 - 100.00 ng/mL Final   Patient has failed these meds in past: fosamax, has taken reclast in the past (tolerated fine?) Patient is currently controlled on the following medications:   Prolia 50mg /ml every 6 months  Calcium carbonate 630mg  daily  Vitamin D 1000 units daily  Fractured her ankle in 2020  We discussed:  Importance of checking DEXA to assess bone density   Update 05/13/20 Discussed USPSTF recommendations for  mammograms.  Pt denies a personal or family history of breast cancer and reports all mammos have been normal. Per recommendation:  "Women aged 19 years or older The USPSTF concludes that the current evidence is insufficient to assess the balance of benefits and harms of screening mammography in women aged 79 years or older."  Plan -Consider discontinuing yearly mammograms pending Dr. Larose Kells approval -Continue current medications   Miscellaneous Meds  Fiber Stool softener   Melvenia Beam Dennys Traughber, PharmD Clinical Pharmacist [place] [facility] at [facility] [phone]

## 2020-05-14 NOTE — Patient Instructions (Signed)
Visit Information  Goals Addressed            This Visit's Progress   . Chronic Care Management Pharmacy Care Plan       CARE PLAN ENTRY (see longitudinal plan of care for additional care plan information)  Current Barriers:  . Chronic Disease Management support, education, and care coordination needs related to Hypertension, Osteoporosis, GERD, Ankle Pain   Hypertension BP Readings from Last 3 Encounters:  03/26/20 139/74  03/03/20 (!) 144/86  12/06/19 (!) 151/82   . Pharmacist Clinical Goal(s): o Over the next 90 days, patient will work with PharmD and providers to maintain BP goal <140/90 . Current regimen:  o Diet and exercise management   . Patient self care activities - Over the next 90 days, patient will: o Check BP 1-2 times per week, document, and provide at future appointments o Ensure daily salt intake < 2300 mg/Kimberly Rodriguez  Osteoporosis . Pharmacist Clinical Goal(s) o Over the next 90 days, patient will work with PharmD and providers to reduce risk of fracture due to osteoporosis . Current regimen:  o Prolia 60mg /mL every 6 months o Calcium carbonate 630mg  o Vitamin D 1000 units daily . Interventions: o Consider completing DEXA at same time as mammogram if agreeable with Dr. Larose Kells . Patient self care activities - Over the next 90 days, patient will: o Complete DEXA at same time as mammogram if agreeable with Dr. Larose Kells  Health Maintenance  . Pharmacist Clinical Goal(s) o Over the next 90 days, patient will work with PharmD and providers to complete health maintenance screenings/vaccinations . Interventions: o Discussed USPSTF recommendations for mammograms.  o Pt can stop receiving mammograms pending Dr. Larose Kells approval . Patient self care activities - Over the next 90 days, patient will: o Maintain necessary health maintenance   Medication management . Pharmacist Clinical Goal(s): o Over the next 90 days, patient will work with PharmD and providers to maintain optimal  medication adherence . Current pharmacy: Mail order . Interventions o Comprehensive medication review performed. o Continue current medication management strategy . Patient self care activities - Over the next 90 days, patient will: o Focus on medication adherence by filling medications appropriately  o Take medications as prescribed o Report any questions or concerns to PharmD and/or provider(s)  Please see past updates related to this goal by clicking on the "Past Updates" button in the selected goal         The patient verbalized understanding of instructions, educational materials, and care plan provided today and declined offer to receive copy of patient instructions, educational materials, and care plan.   Telephone follow up appointment with pharmacy team member scheduled for: 11/12/2020  Melvenia Beam Kimberly Rodriguez, PharmD Clinical Pharmacist [facility] at [facility] [phone]

## 2020-05-21 DIAGNOSIS — R269 Unspecified abnormalities of gait and mobility: Secondary | ICD-10-CM | POA: Diagnosis not present

## 2020-05-21 DIAGNOSIS — R296 Repeated falls: Secondary | ICD-10-CM | POA: Diagnosis not present

## 2020-05-22 ENCOUNTER — Ambulatory Visit (INDEPENDENT_AMBULATORY_CARE_PROVIDER_SITE_OTHER): Payer: Medicare HMO

## 2020-05-22 ENCOUNTER — Other Ambulatory Visit: Payer: Self-pay

## 2020-05-22 DIAGNOSIS — L84 Corns and callosities: Secondary | ICD-10-CM | POA: Diagnosis not present

## 2020-05-22 DIAGNOSIS — M810 Age-related osteoporosis without current pathological fracture: Secondary | ICD-10-CM

## 2020-05-22 DIAGNOSIS — M79672 Pain in left foot: Secondary | ICD-10-CM | POA: Diagnosis not present

## 2020-05-22 DIAGNOSIS — B351 Tinea unguium: Secondary | ICD-10-CM | POA: Diagnosis not present

## 2020-05-22 DIAGNOSIS — M79671 Pain in right foot: Secondary | ICD-10-CM | POA: Diagnosis not present

## 2020-05-22 MED ORDER — DENOSUMAB 60 MG/ML ~~LOC~~ SOSY
60.0000 mg | PREFILLED_SYRINGE | Freq: Once | SUBCUTANEOUS | Status: AC
  Administered 2020-05-22: 60 mg via SUBCUTANEOUS

## 2020-05-22 NOTE — Progress Notes (Addendum)
Pt is here today for prolia injection. Pt was given prolia injection in left subq. Pt tolerated well.  Pt is scheduled to return 11/25/2020  Kathlene November, MD

## 2020-05-29 DIAGNOSIS — R269 Unspecified abnormalities of gait and mobility: Secondary | ICD-10-CM | POA: Diagnosis not present

## 2020-05-29 DIAGNOSIS — R296 Repeated falls: Secondary | ICD-10-CM | POA: Diagnosis not present

## 2020-06-04 DIAGNOSIS — R296 Repeated falls: Secondary | ICD-10-CM | POA: Diagnosis not present

## 2020-06-04 DIAGNOSIS — R269 Unspecified abnormalities of gait and mobility: Secondary | ICD-10-CM | POA: Diagnosis not present

## 2020-06-07 DIAGNOSIS — S0991XA Unspecified injury of ear, initial encounter: Secondary | ICD-10-CM | POA: Diagnosis not present

## 2020-07-22 DIAGNOSIS — H25012 Cortical age-related cataract, left eye: Secondary | ICD-10-CM | POA: Diagnosis not present

## 2020-07-22 DIAGNOSIS — H52203 Unspecified astigmatism, bilateral: Secondary | ICD-10-CM | POA: Diagnosis not present

## 2020-07-22 DIAGNOSIS — H5203 Hypermetropia, bilateral: Secondary | ICD-10-CM | POA: Diagnosis not present

## 2020-07-22 DIAGNOSIS — H524 Presbyopia: Secondary | ICD-10-CM | POA: Diagnosis not present

## 2020-07-22 DIAGNOSIS — H2512 Age-related nuclear cataract, left eye: Secondary | ICD-10-CM | POA: Diagnosis not present

## 2020-07-22 DIAGNOSIS — H43813 Vitreous degeneration, bilateral: Secondary | ICD-10-CM | POA: Diagnosis not present

## 2020-07-22 DIAGNOSIS — H18513 Endothelial corneal dystrophy, bilateral: Secondary | ICD-10-CM | POA: Diagnosis not present

## 2020-07-22 DIAGNOSIS — H26491 Other secondary cataract, right eye: Secondary | ICD-10-CM | POA: Diagnosis not present

## 2020-07-22 DIAGNOSIS — H43393 Other vitreous opacities, bilateral: Secondary | ICD-10-CM | POA: Diagnosis not present

## 2020-07-30 ENCOUNTER — Telehealth: Payer: Self-pay | Admitting: Pharmacist

## 2020-07-30 NOTE — Progress Notes (Addendum)
° ° °  Chronic Care Management Pharmacy Assistant   Name: Kimberly Rodriguez  MRN: [id number] DOB: 13-Apr-1927  Reason for Encounter: [place] Call.  Patient Questions:  1.  Have you seen any other providers since your last visit? Yes.   2.  Any changes in your medicines or health? No.  PCP : Colon Branch, MD   Their chronic conditions include: Hypertension, Osteoporosis, GERD, Ankle Pain.  Office Visits: None since 05/13/20  Consults: 05/22/20 Podiatry [name]. No information given. 05/21/20 Outpatient Rehab Services  Allergies:   Allergies  Allergen Reactions   Astelin [Azelastine Hcl] Palpitations    Medications: Outpatient Encounter Medications as of 07/30/2020  Medication Sig Note   Calcium Carbonate (CALTRATE 600 PO) Take by mouth.      denosumab (PROLIA) 60 MG/ML SOLN injection Inject 60 mg into the skin every 6 (six) months. Administer in upper arm, thigh, or abdomen 03/03/2020: Last 10/2019   Docusate Calcium (STOOL SOFTENER PO) Take by mouth.    FIBER PO Take by mouth.      fluticasone (FLONASE) 50 MCG/ACT nasal spray Place 2 sprays into both nostrils daily as needed.  12/02/2015: PRN   meloxicam (MOBIC) 7.5 MG tablet Take 1 tablet (7.5 mg total) by mouth daily as needed for pain. (Patient not taking: Reported on 03/26/2020) 03/03/2020: PRN   Multiple Vitamins-Minerals (CENTRUM SILVER) CHEW Chew by mouth.      pantoprazole (PROTONIX) 20 MG tablet Take 1 tablet (20 mg total) by mouth daily before breakfast.    predniSONE (STERAPRED UNI-PAK 21 TAB) 10 MG (21) TBPK tablet Standard Taper over 6 days.    No facility-administered encounter medications on file as of 07/30/2020.    Current Diagnosis: Patient Active Problem List   Diagnosis Date Noted   Closed nondisplaced fracture of lateral malleolus of left fibula 01/23/2019   Closed left ankle fracture 01/2019   PCP NOTES >>>>>>>>>>>>>>>>>>>>> 04/01/2015   Lung disease--diffuse interstitial lung disease by  x-ray 08-2014 09/05/2014   Anxiety 02/29/2012   Annual physical exam 12/11/2010   DJD (degenerative joint disease) 10/24/2009   Osteoporosis 06/05/2009    Goals Addressed   None    Patient stated the past 10 days her blood pressure has been running high: 07/30/20 154/86  07/29/20 168/116  2/7/22154/91 07/27/20 152/74  07/26/20 152/72 07/25/20 153/78 07/24/20 143/75 Patient statesd she drinks two cups of coffee decaf with half/half. Patient stated she doesn't drink a lot water, she mostly drinks green tea or small glasses of diet coke. Patient stated she gets out the house sometimes to see her friends and does all of her house duties herself. Patient stated she eats cereal and 1/2 of bananna for breakfast. For lunch she stated she usually eats yogurt with crackers. For diner she usually eats oven roasted [place] or chicken with vegetables. She stated she likes to eat ice cream for snacks. She doesn't have any concerns or questions about her medications at this time.    Follow-Up:  Pharmacist Review   Charlann Lange, RMA Clinical Pharmacist Assistant [phone]  10 minutes spent in review, coordination, and documentation.  Patient has f/u visit with Dr. Larose Kells next month.  Will pass these along.  Reviewed by: Beverly Milch, PharmD Clinical Pharmacist [facility] [phone]

## 2020-09-03 ENCOUNTER — Ambulatory Visit (INDEPENDENT_AMBULATORY_CARE_PROVIDER_SITE_OTHER): Payer: Medicare HMO | Admitting: Internal Medicine

## 2020-09-03 ENCOUNTER — Encounter: Payer: Self-pay | Admitting: Internal Medicine

## 2020-09-03 ENCOUNTER — Other Ambulatory Visit: Payer: Self-pay

## 2020-09-03 ENCOUNTER — Ambulatory Visit (HOSPITAL_BASED_OUTPATIENT_CLINIC_OR_DEPARTMENT_OTHER)
Admission: RE | Admit: 2020-09-03 | Discharge: 2020-09-03 | Disposition: A | Discharge status: Home / Self Care | Payer: Medicare HMO | Source: Ambulatory Visit | Attending: Internal Medicine | Admitting: Internal Medicine

## 2020-09-03 VITALS — BP 162/76 | HR 82 | Temp 98.5°F | Resp 16 | Ht 61.0 in | Wt 158.5 lb

## 2020-09-03 DIAGNOSIS — Z0001 Encounter for general adult medical examination with abnormal findings: Secondary | ICD-10-CM | POA: Diagnosis not present

## 2020-09-03 DIAGNOSIS — M8949 Other hypertrophic osteoarthropathy, multiple sites: Secondary | ICD-10-CM

## 2020-09-03 DIAGNOSIS — I10 Essential (primary) hypertension: Secondary | ICD-10-CM

## 2020-09-03 DIAGNOSIS — M159 Polyosteoarthritis, unspecified: Secondary | ICD-10-CM

## 2020-09-03 DIAGNOSIS — K449 Diaphragmatic hernia without obstruction or gangrene: Secondary | ICD-10-CM | POA: Diagnosis not present

## 2020-09-03 DIAGNOSIS — R059 Cough, unspecified: Secondary | ICD-10-CM

## 2020-09-03 DIAGNOSIS — R030 Elevated blood-pressure reading, without diagnosis of hypertension: Secondary | ICD-10-CM

## 2020-09-03 DIAGNOSIS — J40 Bronchitis, not specified as acute or chronic: Secondary | ICD-10-CM

## 2020-09-03 DIAGNOSIS — M150 Primary generalized (osteo)arthritis: Secondary | ICD-10-CM

## 2020-09-03 DIAGNOSIS — Z0000 Encounter for general adult medical examination without abnormal findings: Secondary | ICD-10-CM

## 2020-09-03 DIAGNOSIS — J984 Other disorders of lung: Secondary | ICD-10-CM

## 2020-09-03 LAB — CBC WITH DIFFERENTIAL/PLATELET
Basophils Absolute: 0.1 10*3/uL (ref 0.0–0.1)
Basophils Relative: 1.2 % (ref 0.0–3.0)
Eosinophils Absolute: 0.2 10*3/uL (ref 0.0–0.7)
Eosinophils Relative: 1.9 % (ref 0.0–5.0)
HCT: 40.8 % (ref 36.0–46.0)
Hemoglobin: 13.5 g/dL (ref 12.0–15.0)
Lymphocytes Relative: 10.7 % — ABNORMAL LOW (ref 12.0–46.0)
Lymphs Abs: 0.9 10*3/uL (ref 0.7–4.0)
MCHC: 33.2 g/dL (ref 30.0–36.0)
MCV: 94.1 fl (ref 78.0–100.0)
Monocytes Absolute: 1.5 10*3/uL — ABNORMAL HIGH (ref 0.1–1.0)
Monocytes Relative: 17.5 % — ABNORMAL HIGH (ref 3.0–12.0)
Neutro Abs: 5.9 10*3/uL (ref 1.4–7.7)
Neutrophils Relative %: 68.7 % (ref 43.0–77.0)
Platelets: 225 10*3/uL (ref 150.0–400.0)
RBC: 4.33 Mil/uL (ref 3.87–5.11)
RDW: 13.7 % (ref 11.5–15.5)
WBC: 8.5 10*3/uL (ref 4.0–10.5)

## 2020-09-03 LAB — LIPID PANEL
Cholesterol: 170 mg/dL (ref 0–200)
HDL: 69.9 mg/dL (ref >39.00)
LDL Cholesterol: 87 mg/dL (ref 0–99)
NonHDL: 100.18
Total CHOL/HDL Ratio: 2
Triglycerides: 65 mg/dL (ref 0.0–149.0)
VLDL: 13 mg/dL (ref 0.0–40.0)

## 2020-09-03 LAB — COMPREHENSIVE METABOLIC PANEL
ALT: 15 U/L (ref 0–35)
AST: 18 U/L (ref 0–37)
Albumin: 3.8 g/dL (ref 3.5–5.2)
Alkaline Phosphatase: 74 U/L (ref 39–117)
BUN: 13 mg/dL (ref 6–23)
CO2: 31 mEq/L (ref 19–32)
Calcium: 9.4 mg/dL (ref 8.4–10.5)
Chloride: 102 mEq/L (ref 96–112)
Creatinine, Ser: 0.69 mg/dL (ref 0.40–1.20)
GFR: 75.81 mL/min (ref >60.00)
Glucose, Bld: 84 mg/dL (ref 70–99)
Potassium: 4.7 mEq/L (ref 3.5–5.1)
Sodium: 140 mEq/L (ref 135–145)
Total Bilirubin: 0.4 mg/dL (ref 0.2–1.2)
Total Protein: 6.3 g/dL (ref 6.0–8.3)

## 2020-09-03 MED ORDER — AZITHROMYCIN 250 MG PO TABS: ORAL_TABLET | ORAL | 0 refills | Status: DC

## 2020-09-03 MED ORDER — AMLODIPINE BESYLATE 2.5 MG PO TABS: 2.5000 mg | ORAL_TABLET | Freq: Every day | ORAL | 3 refills | Status: DC

## 2020-09-03 MED ORDER — ALBUTEROL SULFATE HFA 108 (90 BASE) MCG/ACT IN AERS: 2.0000 | INHALATION_SPRAY | Freq: Four times a day (QID) | RESPIRATORY_TRACT | 1 refills | Status: DC | PRN

## 2020-09-03 NOTE — Progress Notes (Unsigned)
Subjective:    Patient ID: Kimberly Rodriguez, female    DOB: 1927-04-23, 87 y.o.   MRN: [id number]  DOS:  09/03/2020 Type of visit - description: Here for CPX.  Upper respiratory symptoms for the last week:   Clear nasal discharge, postnasal dripping, cough associated with some chest congestion but no wheezing. Denies any fever chills. No chest pain or difficulty breathing.    Message from Daniels.D. on 07/30/2020, ambulatory BPs: 07/30/20 154/86  07/29/20 168/116  07/28/20 154/91 07/27/20 152/74  07/26/20 152/72 07/25/20 153/78 07/24/20 143/75  She also brought her BP readings for March, we reviewed the data together.  Review of Systems  Other than above, a 14 point review of systems is negative     Past Medical History:  Diagnosis Date  . Anemia   . Blood transfusion without reported diagnosis   . Cataract   . Closed left ankle fracture 01/2019   no surgery  . Eustachian tube dysfunction    Right side, Dr. Laurance Flatten, ENT  . Microhematuria    s/p eval by urology 2008: U/S, CT, cysto w/ neg Bx (dx w/ a renal cyst, see reports)  . Osteoarthritis    h/o back pain, sees ortho s/p shots  . Osteoporosis     Past Surgical History:  Procedure Laterality Date  . ABDOMINAL HYSTERECTOMY     no oophorectomy per pt  . APPENDECTOMY    . COLONOSCOPY    . DILATION AND CURETTAGE OF UTERUS    . TONSILLECTOMY      Allergies as of 09/03/2020      Reactions   Astelin [azelastine Hcl] Palpitations      Medication List       Accurate as of September 03, 2020 11:59 PM. If you have any questions, ask your nurse or doctor.        STOP taking these medications   meloxicam 7.5 MG tablet Commonly known as: MOBIC Stopped by: Kathlene November, MD   predniSONE 10 MG (21) Tbpk tablet Commonly known as: STERAPRED UNI-PAK 21 TAB Stopped by: Kathlene November, MD     TAKE these medications   albuterol 108 (90 Base) MCG/ACT inhaler Commonly known as: VENTOLIN HFA Inhale 2 puffs into the lungs every 6 (six) hours as  needed for wheezing or shortness of breath. Started by: Kathlene November, MD   amLODipine 2.5 MG tablet Commonly known as: NORVASC Take 1 tablet (2.5 mg total) by mouth daily. Started by: Kathlene November, MD   azithromycin 250 MG tablet Commonly known as: Zithromax Z-Pak 2 tabs a day the first day, then 1 tab a day x 4 days Started by: Kathlene November, MD   CALTRATE 600 PO Take by mouth.   Centrum Silver Chew Chew by mouth.   denosumab 60 MG/ML Soln injection Commonly known as: PROLIA Inject 60 mg into the skin every 6 (six) months. Administer in upper arm, thigh, or abdomen   FIBER PO Take by mouth.   fluticasone 50 MCG/ACT nasal spray Commonly known as: FLONASE Place 2 sprays into both nostrils daily as needed.   pantoprazole 20 MG tablet Commonly known as: PROTONIX Take 1 tablet (20 mg total) by mouth daily before breakfast.   STOOL SOFTENER PO Take by mouth.          Objective:   Physical Exam BP (!) 162/76 (BP Location: Left Arm, Patient Position: Sitting, Cuff Size: Small)   Pulse 82   Temp 98.5 F (36.9 C) (Oral)   Resp 16  Ht 5\' 1"  (1.549 m)   Wt 158 lb 8 oz (71.9 kg)   SpO2 96%   BMI 29.95 kg/m  General: Well developed, NAD, BMI noted Neck: No  thyromegaly  HEENT:  Normocephalic . Face symmetric, atraumatic Lungs:  + Rhonchi, few scattered wheezing that clear with cough, no large airway congestion, no crackles. Normal respiratory effort, no intercostal retractions, no accessory muscle use. Heart: RRR, soft systolic murmur. Abdomen:  Not distended, soft, non-tender. No rebound or rigidity.   Lower extremities: no pretibial edema bilaterally  Skin: Exposed areas without rash. Not pale. Not jaundice Neurologic:  alert & oriented X3.  Speech normal, gait appropriate for age and unassisted Strength symmetric and appropriate for age.  Psych: Cognition and judgment appear intact.  Cooperative with normal attention span and concentration.  Behavior  appropriate. No anxious or depressed appearing.     Assessment      Assessment interstitial lung dz per chest x-ray 08-2014, PFTs 08-2014: minimal obstruction MSK: --DJD --Neck pain --Back pain: h/o acupuncture before Osteoporosis:  T score 04-2015 -3.1, prolia #2  11-2015  T score -3.4 (10/2016), saw Dr. Loanne Drilling, work-up for secondary etiologies negative, rx Prolia 03/2018 Microhematuria  s/p eval by urology 2008: U/S, CT, cysto w/ neg Bx (dx w/ a renal cyst, see reports) ET dysfuntion saw ENT Dr Laurance Flatten 2016 Anemia, saw GI: 06-2016: Cscope: polyps, tubular adenoma; EGD: cameron lesions, no Bx H/o Anxiety Murmur: Echo 09/2019: Mild to moderate MR and AS   PLAN  Here for CPX, found to have an acute problem. Bronchitis: Respiratory symptoms a started a week ago, see HPI, some rhonchi and scattered wheezes on exam. No fever chills. She is triple vaccinated against COVID. Plan: Chest x-ray, Zithromax, albuterol.   Call if not gradually better. Interstitial lung disease: Per x-ray 08/2014, denies any chronic respiratory symptoms including no cough or DOE. HTN: Ambulatory BP slightly elevated, see HPI.  I review her ambulatory BPs for this month: Only 3 out of 14 readings are above 150. She may benefit from a slightly lower BP, will start amlodipine 2.5 mg, 1 tablet daily, reassess in 3 months DJD: Currently not needing any pain medications Osteoporosis: Last Prolia December 2021 RTC 3 months   In addition to CPX, we addressed her chronic issues and an acute problem. This visit occurred during the SARS-CoV-2 public health emergency.  Safety protocols were in place, including screening questions prior to the visit, additional usage of staff PPE, and extensive cleaning of exam room while observing appropriate contact time as indicated for disinfecting solutions.

## 2020-09-03 NOTE — Patient Instructions (Addendum)
Next Prolia due after November 20, 2020.  High blood pressure Start amlodipine 2.5 mg: 1 tablet daily. Check the  blood pressure regularly BP GOAL is between 110/65 and  135/85. If it is consistently higher or lower, let me know  You have bronchitis: Go to the first floor and get a chest x-ray Robitussin-DM Zithromax, an antibiotic Start using inhaler called albuterol if you have chest congestion, wheezing or excessive cough. Call if not gradually better.   Check with your pharmacy to be sure you got your second Shingrix vaccine   GO TO THE LAB : Get the blood work     [place], [place] back for   a checkup in 3 months

## 2020-09-04 ENCOUNTER — Encounter: Payer: Self-pay | Admitting: Internal Medicine

## 2020-09-04 DIAGNOSIS — I10 Essential (primary) hypertension: Secondary | ICD-10-CM | POA: Insufficient documentation

## 2020-09-04 NOTE — Assessment & Plan Note (Signed)
Here for CPX, found to have an acute problem. Bronchitis: Respiratory symptoms a started a week ago, see HPI, some rhonchi and scattered wheezes on exam. No fever chills. She is triple vaccinated against COVID. Plan: Chest x-ray, Zithromax, albuterol.   Call if not gradually better. Interstitial lung disease: Per x-ray 08/2014, denies any chronic respiratory symptoms including no cough or DOE. HTN: Ambulatory BP slightly elevated, see HPI.  I review her ambulatory BPs for this month: Only 3 out of 14 readings are above 150. She may benefit from a slightly lower BP, will start amlodipine 2.5 mg, 1 tablet daily, reassess in 3 months DJD: Currently not needing any pain medications Osteoporosis: Last Prolia December 2021 RTC 3 months

## 2020-09-04 NOTE — Assessment & Plan Note (Signed)
-  Td  2012, pneumonia shot, 2006 and 04/2018; prevnar:2015; zostavax 2010 - shingrix at her [facility]) per pt, two doses? rec to check w/ them  - covid vax x 3    - had a flu shot   -No further cervical ,Breast or colon cancer screening (did have a colonoscopy 06-2016 D/T anemia) -Diet and exercise discussed  -Labs: CMP, CBC, FLP, chest x-ray - advance directives : on file

## 2020-09-24 DIAGNOSIS — M79672 Pain in left foot: Secondary | ICD-10-CM | POA: Diagnosis not present

## 2020-09-24 DIAGNOSIS — L84 Corns and callosities: Secondary | ICD-10-CM | POA: Diagnosis not present

## 2020-09-24 DIAGNOSIS — B351 Tinea unguium: Secondary | ICD-10-CM | POA: Diagnosis not present

## 2020-09-24 DIAGNOSIS — M79671 Pain in right foot: Secondary | ICD-10-CM | POA: Diagnosis not present

## 2020-11-12 ENCOUNTER — Telehealth: Payer: Medicare HMO

## 2020-11-13 ENCOUNTER — Encounter: Payer: Self-pay | Admitting: Medical

## 2020-11-13 ENCOUNTER — Other Ambulatory Visit: Payer: Self-pay

## 2020-11-13 ENCOUNTER — Ambulatory Visit (INDEPENDENT_AMBULATORY_CARE_PROVIDER_SITE_OTHER): Payer: Medicare HMO | Admitting: Medical

## 2020-11-13 VITALS — BP 126/70 | HR 85 | Temp 97.6°F | Resp 18 | Ht 61.0 in | Wt 158.0 lb

## 2020-11-13 DIAGNOSIS — T7840XA Allergy, unspecified, initial encounter: Secondary | ICD-10-CM

## 2020-11-13 MED ORDER — PREDNISONE 10 MG (21) PO TBPK: ORAL_TABLET | ORAL | 0 refills | Status: DC

## 2020-11-13 MED ORDER — METHYLPREDNISOLONE ACETATE 40 MG/ML IJ SUSP
40.0000 mg | Freq: Once | INTRAMUSCULAR | Status: AC
  Administered 2020-11-13: 40 mg via INTRAMUSCULAR

## 2020-11-13 NOTE — Addendum Note (Signed)
Addended by: Wynonia Musty A on: 11/13/2020 09:50 AM   Modules accepted: Orders

## 2020-11-13 NOTE — Progress Notes (Signed)
Subjective:    Patient ID: Kimberly Rodriguez, female    DOB: 1927/06/14, 87 y.o.   MRN: [id number]  HPI  Pt has recent rash that itches. Reports rash occurred after direct exposure to poison ivy 2 days ago while pulling weeds. Pt rash is in the area of face, rt arm and rt side thorax.. Pt reports no shortness of breath or wheezing. Reports history of known reaction to poison ivy. Pt is not diabetic.  Hx of very sensitive to poison ivy.  Pt states had hard time falling asleep last night due to itching.  Review of Systems  Constitutional: Negative for chills, fatigue and fever.  Respiratory: Negative for cough, chest tightness, shortness of breath and wheezing.   Cardiovascular: Negative for chest pain and palpitations.  Gastrointestinal: Negative for abdominal pain.  Skin: Positive for rash.  Hematological: Negative for adenopathy. Does not bruise/bleed easily.  Psychiatric/Behavioral: Negative for behavioral problems and confusion.     Past Medical History:  Diagnosis Date  . Anemia   . Blood transfusion without reported diagnosis   . Cataract   . Closed left ankle fracture 01/2019   no surgery  . Eustachian tube dysfunction    Right side, Dr. Laurance Flatten, ENT  . Microhematuria    s/p eval by urology 2008: U/S, CT, cysto w/ neg Bx (dx w/ a renal cyst, see reports)  . Osteoarthritis    h/o back pain, sees ortho s/p shots  . Osteoporosis      Social History   Socioeconomic History  . Marital status: Widowed    Spouse name: Not on file  . Number of children: 0  . Years of education: Not on file  . Highest education level: Not on file  Occupational History  . Occupation: retired  Tobacco Use  . Smoking status: Former Research scientist (life sciences)  . Smokeless tobacco: Never Used  Substance and Sexual Activity  . Alcohol use: Yes    Comment: socially -wine  . Drug use: No  . Sexual activity: Not on file  Other Topics Concern  . Not on file  Social History Narrative   Household -- lives by  herself in a townhouse    Still drives    Emergency contact-- [name] (sister in law) lives in [place] --[phone]   She is her health POA   Social Determinants of Health   Financial Resource Strain: Low Risk   . Difficulty of Paying Living Expenses: Not hard at all  Food Insecurity: No Food Insecurity  . Worried About Charity fundraiser in the Last Year: Never true  . Ran Out of Food in the Last Year: Never true  Transportation Needs: No Transportation Needs  . Lack of Transportation (Medical): No  . Lack of Transportation (Non-Medical): No  Physical Activity: Not on file  Stress: Not on file  Social Connections: Not on file  Intimate Partner Violence: Not on file    Past Surgical History:  Procedure Laterality Date  . ABDOMINAL HYSTERECTOMY     no oophorectomy per pt  . APPENDECTOMY    . COLONOSCOPY    . DILATION AND CURETTAGE OF UTERUS    . TONSILLECTOMY      Family History  Problem Relation Age of Onset  . Heart disease Father   . Prostate cancer Father   . Heart disease Mother   . Colon cancer Neg Hx   . Breast cancer Neg Hx   . Diabetes Neg Hx   . Osteoporosis Neg Hx  Allergies  Allergen Reactions  . Astelin [Azelastine Hcl] Palpitations    Current Outpatient Medications on File Prior to Visit  Medication Sig Dispense Refill  . amLODipine (NORVASC) 2.5 MG tablet Take 1 tablet (2.5 mg total) by mouth daily. 30 tablet 3  . Calcium Carbonate (CALTRATE 600 PO) Take by mouth.    . denosumab (PROLIA) 60 MG/ML SOLN injection Inject 60 mg into the skin every 6 (six) months. Administer in upper arm, thigh, or abdomen    . Docusate Calcium (STOOL SOFTENER PO) Take by mouth.    . FIBER PO Take by mouth.    . fluticasone (FLONASE) 50 MCG/ACT nasal spray Place 2 sprays into both nostrils daily as needed.    . Multiple Vitamins-Minerals (CENTRUM SILVER) CHEW Chew by mouth.    . pantoprazole (PROTONIX) 20 MG tablet Take 1 tablet (20 mg total) by mouth daily before  breakfast. 90 tablet 3  . predniSONE (DELTASONE) 10 MG tablet TAKE 6 TABS BY MOUTH ON DAY 1; 5 TABS ON DAY 2; 4 TABS ON DAY 3; 3 TABS ON DAY 4; 2 TABS ON DAY 5; 1 TAB ON DAY 6 THEN STOP 21 tablet 0   No current facility-administered medications on file prior to visit.    BP 126/70 (BP Location: Left Arm, Patient Position: Sitting, Cuff Size: Normal)   Pulse 85   Temp 97.6 F (36.4 C)   Resp 18   Ht 5\' 1"  (1.549 m)   Wt 158 lb (71.7 kg)   SpO2 98%   BMI 29.85 kg/m       Objective:   Physical Exam  General- No acute distress. Pleasant patient. Neck- Full range of motion, no jvd Lungs- Clear, even and unlabored. Heart- regular rate and rhythm. Neurologic- CNII- XII grossly intact. Skin- red rash on forehead, both cheeks, nose, chin and base of neck. Also on . rt foreram and rt thorax.(rt tiigh as well per pt report)      Assessment & Plan:  You recently have allergic reaction to poison ivy. We gave you depo-medrol 40 im injection. I am also prescribing oral taper prednisone and benadryl for itching at night if needed. Your rash should gradually improve. If worsening or expanding please notify us.  Follow up in 7 days or as needed.  Note due to extensive level of rash/reaction decided on both injection and moderate high dose prednisone. Had rash been minimal would have preferred to rx low dose medrol taper alone  in her age group but more aggressive tx needed. Appears to be in very good shape for 87 yo.

## 2020-11-13 NOTE — Patient Instructions (Addendum)
You recently have allergic reaction to poison ivy. We gave you depo-medrol 40  im injection. I am also prescribing oral taper prednisone and benadryl for itching at night if needed. Your rash should gradually improve. If worsening or expanding please notify us.  Follow up in 7 days or as needed.  Note due to extensive level of rash/reaction decided on both injection and moderate high dose prednisone. Had rash been minimal would have preferred to rx low dose medrol taper alone  in her age group but more aggressive tx needed. Appears to be in very good shape for 87 yo.

## 2020-11-14 ENCOUNTER — Ambulatory Visit: Payer: Medicare HMO

## 2020-11-19 ENCOUNTER — Telehealth: Payer: Medicare HMO

## 2020-11-25 ENCOUNTER — Ambulatory Visit: Payer: Medicare HMO

## 2020-11-28 ENCOUNTER — Ambulatory Visit: Payer: Medicare HMO

## 2020-12-04 ENCOUNTER — Ambulatory Visit: Payer: Medicare HMO | Attending: Internal Medicine

## 2020-12-04 ENCOUNTER — Encounter: Payer: Self-pay | Admitting: Internal Medicine

## 2020-12-04 ENCOUNTER — Other Ambulatory Visit: Payer: Self-pay

## 2020-12-04 ENCOUNTER — Ambulatory Visit (INDEPENDENT_AMBULATORY_CARE_PROVIDER_SITE_OTHER): Payer: Medicare HMO | Admitting: Internal Medicine

## 2020-12-04 VITALS — BP 144/76 | HR 89 | Temp 98.1°F | Resp 16 | Ht 61.0 in | Wt 156.2 lb

## 2020-12-04 DIAGNOSIS — M810 Age-related osteoporosis without current pathological fracture: Secondary | ICD-10-CM | POA: Diagnosis not present

## 2020-12-04 DIAGNOSIS — I10 Essential (primary) hypertension: Secondary | ICD-10-CM | POA: Diagnosis not present

## 2020-12-04 DIAGNOSIS — Z23 Encounter for immunization: Secondary | ICD-10-CM

## 2020-12-04 MED ORDER — AMLODIPINE BESYLATE 2.5 MG PO TABS: 2.5000 mg | ORAL_TABLET | Freq: Every day | ORAL | 1 refills | Status: DC

## 2020-12-04 MED ORDER — DENOSUMAB 60 MG/ML ~~LOC~~ SOSY
60.0000 mg | PREFILLED_SYRINGE | Freq: Once | SUBCUTANEOUS | Status: AC
  Administered 2020-12-04: 60 mg via SUBCUTANEOUS

## 2020-12-04 NOTE — Progress Notes (Signed)
   Covid-19 Vaccination Clinic  Name:  Kimberly Rodriguez    MRN: [id number] DOB: 1927/04/02  12/04/2020  [name] was observed post Covid-19 immunization for 15 minutes without incident. She was provided with Vaccine Information Sheet and instruction to access the V-Safe system.   [name] was instructed to call 911 with any severe reactions post vaccine: Difficulty breathing  Swelling of face and throat  A fast heartbeat  A bad rash all over body  Dizziness and weakness   Immunizations Administered     Name Date Dose VIS Date Route   PFIZER Comrnaty(Gray TOP) Covid-19 Vaccine 12/04/2020 10:51 AM 0.3 mL 05/29/2020 Intramuscular   Manufacturer: [employer]   Lot: [id number]   [place]: [phone]

## 2020-12-04 NOTE — Progress Notes (Signed)
Subjective:    Patient ID: Kimberly Rodriguez, female    DOB: [birth year], 86 y.o.   MRN: [id number]  DOS:  12/04/2020 Type of visit - description: F/U  Routine follow-up Today with talk about Prolia, hypertension, vaccinations.  We review her blood work. In general feels well.  BP Readings from Last 3 Encounters:  12/04/20 (!) 144/76  11/13/20 126/70  09/03/20 (!) 162/76     Review of Systems No recent falls Has a history of lower extremity edema well controlled with compression stockings No recent cough, sputum production or chest congestion  Past Medical History:  Diagnosis Date   Anemia    Blood transfusion without reported diagnosis    Cataract    Closed left ankle fracture 01/2019   no surgery   Eustachian tube dysfunction    Right side, Dr. Laurance Flatten, ENT   Microhematuria    s/p eval by urology 2008: U/S, CT, cysto w/ neg Bx (dx w/ a renal cyst, see reports)   Osteoarthritis    h/o back pain, sees ortho s/p shots   Osteoporosis     Past Surgical History:  Procedure Laterality Date   ABDOMINAL HYSTERECTOMY     no oophorectomy per pt   APPENDECTOMY     COLONOSCOPY     DILATION AND CURETTAGE OF UTERUS     TONSILLECTOMY      Allergies as of 12/04/2020       Reactions   Astelin [azelastine Hcl] Palpitations        Medication List        Accurate as of December 04, 2020 11:59 PM. If you have any questions, ask your nurse or doctor.          STOP taking these medications    predniSONE 10 MG (21) Tbpk tablet Commonly known as: STERAPRED UNI-PAK 21 TAB Stopped by: Kathlene November, MD   predniSONE 10 MG tablet Commonly known as: DELTASONE Stopped by: Kathlene November, MD       TAKE these medications    amLODipine 2.5 MG tablet Commonly known as: NORVASC Take 1 tablet (2.5 mg total) by mouth daily.   CALTRATE 600 PO Take by mouth.   Centrum Silver Chew Chew by mouth.   denosumab 60 MG/ML Soln injection Commonly known as: PROLIA Inject 60 mg into the skin  every 6 (six) months. Administer in upper arm, thigh, or abdomen   FIBER PO Take by mouth.   fluticasone 50 MCG/ACT nasal spray Commonly known as: FLONASE Place 2 sprays into both nostrils daily as needed.   pantoprazole 20 MG tablet Commonly known as: PROTONIX Take 1 tablet (20 mg total) by mouth daily before breakfast.   STOOL SOFTENER PO Take by mouth.           Objective:   Physical Exam BP (!) 144/76 (BP Location: Left Arm, Patient Position: Sitting, Cuff Size: Small)   Pulse 89   Temp 98.1 F (36.7 C) (Oral)   Resp 16   Ht 5\' 1"  (1.549 m)   Wt 156 lb 4 oz (70.9 kg)   SpO2 96%   BMI 29.52 kg/m  General:   Well developed, NAD, BMI noted. HEENT:  Normocephalic . Face symmetric, atraumatic Lungs:  CTA B Normal respiratory effort, no intercostal retractions, no accessory muscle use. Heart: RRR,  no murmur.  Lower extremities: no pretibial edema bilaterally  Skin: Not pale. Not jaundice Neurologic:  alert & oriented X3.  Speech normal, gait appropriate for age and unassisted Psych--  Cognition and judgment appear intact.  Cooperative with normal attention span and concentration.  Behavior appropriate. No anxious or depressed appearing.      Assessment    Assessment HTN Interstitial lung dz per chest x-ray 08-2014, PFTs 08-2014: minimal obstruction MSK: --DJD --Neck pain --Back pain: h/o acupuncture before Osteoporosis:  T score 04-2015 -3.1, prolia #2  11-2015  T score -3.4 (10/2016), saw Dr. Loanne Drilling, work-up for secondary etiologies negative, rx Prolia 03/2018 Microhematuria  s/p eval by urology 2008: U/S, CT, cysto w/ neg Bx (dx w/ a renal cyst, see reports) ET dysfuntion saw ENT Dr Laurance Flatten 2016 Anemia, saw GI: 06-2016: Cscope: polyps, tubular adenoma; EGD: cameron lesions, no Bx H/o Anxiety Murmur: Echo 09/2019: Mild to moderate MR and AS   PLAN  HTN: Few months ago, started amlodipine 2.5 mg daily, ambulatory BPs all well within 140s/ 70.  D/w pt  increase medication or stay the same, elected to stay the same.  Continue monitoring BPs. RF sent. Osteoporosis: Prolia shot today Preventive care: Rec COVID-vaccine #4.  Also recommend good hydration this summer. RTC 6 months.   This visit occurred during the SARS-CoV-2 public health emergency.  Safety protocols were in place, including screening questions prior to the visit, additional usage of staff PPE, and extensive cleaning of exam room while observing appropriate contact time as indicated for disinfecting solutions.

## 2020-12-04 NOTE — Patient Instructions (Addendum)
Continue same medications check your blood pressures regularly BP GOAL is between 110/65 and  140s/85. If it is consistently higher or lower, let me know  Please proceed with your COVID-vaccine #4   GO TO THE FRONT DESK, [place] back for a checkup in 6 months

## 2020-12-05 ENCOUNTER — Other Ambulatory Visit (HOSPITAL_BASED_OUTPATIENT_CLINIC_OR_DEPARTMENT_OTHER): Payer: Self-pay

## 2020-12-05 MED ORDER — COVID-19 MRNA VAC-TRIS(PFIZER) 30 MCG/0.3ML IM SUSP
INTRAMUSCULAR | 0 refills | Status: DC
  Filled 2020-12-05: qty 0.3, 1d supply, fill #0

## 2020-12-06 NOTE — Assessment & Plan Note (Signed)
HTN: Few months ago, started amlodipine 2.5 mg daily, ambulatory BPs all well within 140s/ 70.  D/w pt increase medication or stay the same, elected to stay the same.  Continue monitoring BPs. RF sent. Osteoporosis: Prolia shot today Preventive care: Rec COVID-vaccine #4.  Also recommend good hydration this summer. RTC 6 months.

## 2020-12-24 DIAGNOSIS — B351 Tinea unguium: Secondary | ICD-10-CM | POA: Diagnosis not present

## 2020-12-24 DIAGNOSIS — M79671 Pain in right foot: Secondary | ICD-10-CM | POA: Diagnosis not present

## 2020-12-24 DIAGNOSIS — M79672 Pain in left foot: Secondary | ICD-10-CM | POA: Diagnosis not present

## 2020-12-30 ENCOUNTER — Ambulatory Visit (INDEPENDENT_AMBULATORY_CARE_PROVIDER_SITE_OTHER): Payer: Medicare HMO | Admitting: Pharmacist

## 2020-12-30 DIAGNOSIS — I10 Essential (primary) hypertension: Secondary | ICD-10-CM

## 2020-12-30 DIAGNOSIS — M810 Age-related osteoporosis without current pathological fracture: Secondary | ICD-10-CM

## 2020-12-30 NOTE — Chronic Care Management (AMB) (Signed)
Chronic Care Management Pharmacy Note  12/30/2020 Name:  Kimberly Rodriguez MRN:  [id number] DOB:  1927/06/07   Subjective: Kimberly Rodriguez is an 87 y.o. year old female who is a primary patient of Paz, Alda Berthold, MD.  The CCM team was consulted for assistance with disease management and care coordination needs.    Engaged with patient by telephone for follow up visit in response to provider referral for pharmacy case management and/or care coordination services.   Consent to Services:  The patient was given information about Chronic Care Management services, agreed to services, and gave verbal consent prior to initiation of services.  Please see initial visit note for detailed documentation.   Patient Care Team: Colon Branch, MD as PCP - General Roel Cluck, MD as Referring Physician (Ophthalmology) Maggie Schwalbe., MD (Family Medicine) Cherre Robins, PharmD (Pharmacist)  Recent office visits: 12/04/2020 - PCP (DR Larose Kells)  Routine f/u; received Prolia injection and dose #4 of CVOID vaccine / 2nd Booster.  11/13/2020 - PCP ([place]) Acute visit. Noted allergic reaction to poison ivy. Given depo medrol IM in office and prescribed prednisone taper for 6 days 09/03/2020 - PCP (Dr Larose Kells) - [place]. BP was 162/72 and ambulatory BP's elevated - started amlodipine 2.23m daily. Also noted to have acute bronchitis - Rx for zithromax and albuterol.   Recent consult visits: 09/24/2020 - Podiatry (Dr HMallie Mussel 07/22/2020 - [facility]hthal[facility]; No meds prescribed or changed.   Hospital visits: None in previous 6 months  Objective:  Lab Results  Component Value Date   CREATININE 0.69 09/03/2020   CREATININE 0.70 05/15/2019   CREATININE 0.73 09/18/2018    Lab Results  Component Value Date   HGBA1C 5.5 10/28/2006   Last diabetic Eye exam: No results found for: HMDIABEYEEXA  Last diabetic Foot exam: No results found for: HMDIABFOOTEX       Component Value Date/Time   CHOL 170 09/03/2020 1055   TRIG 65.0 09/03/2020 1055   HDL 69.90 09/03/2020 1055   CHOLHDL 2 09/03/2020 1055   VLDL 13.0 09/03/2020 1055   LDLCALC 87 09/03/2020 1055   LDLDIRECT 125.0 08/14/2010 1105    Hepatic Function Latest Ref Rng & Units 09/03/2020 05/15/2019 09/18/2018  Total Protein 6.0 - 8.3 g/dL 6.3 6.2 6.1  Albumin 3.5 - 5.2 g/dL 3.8 3.7 4.0  AST 0 - 37 U/L '18 18 18  ' ALT 0 - 35 U/L '15 14 13  ' Alk Phosphatase 39 - 117 U/L 74 78 67  Total Bilirubin 0.2 - 1.2 mg/dL 0.4 0.5 0.3  Bilirubin, Direct 0.0 - 0.3 mg/dL - - -    Lab Results  Component Value Date/Time   TSH 2.53 09/18/2018 02:28 PM   TSH 2.39 04/29/2016 10:32 AM    CBC Latest Ref Rng & Units 09/03/2020 05/15/2019 09/18/2018  WBC 4.0 - 10.5 K/uL 8.5 6.6 7.4  Hemoglobin 12.0 - 15.0 g/dL 13.5 13.2 13.1  Hematocrit 36.0 - 46.0 % 40.8 39.9 39.3  Platelets 150.0 - 400.0 K/uL 225.0 193.0 212.0    Lab Results  Component Value Date/Time   VD25OH 54.15 07/12/2017 02:11 PM   VD25OH 57 12/11/2010 08:42 AM   VD25OH 47 08/14/2010 08:20 PM    Clinical ASCVD: No  The ASCVD Risk score (Mikey BussingDC Jr., et al., 2013) failed to calculate for the following reasons:   The 2013 ASCVD risk score is only valid for ages 413to 775  Social History   Tobacco Use  Smoking Status Former   Pack years: 0.00  Smokeless Tobacco Never   BP Readings from Last 3 Encounters:  12/04/20 (!) 144/76  11/13/20 126/70  09/03/20 (!) 162/76   Pulse Readings from Last 3 Encounters:  12/04/20 89  11/13/20 85  09/03/20 82   Wt Readings from Last 3 Encounters:  12/04/20 156 lb 4 oz (70.9 kg)  11/13/20 158 lb (71.7 kg)  09/03/20 158 lb 8 oz (71.9 kg)    Assessment: Review of patient past medical history, allergies, medications, health status, including review of consultants reports, laboratory and other test data, was performed as part of comprehensive evaluation and provision of chronic care management services.    SDOH:  (Social Determinants of Health) assessments and interventions performed:  SDOH Interventions    Flowsheet Row Most Recent Value  SDOH Interventions   Financial Strain Interventions Intervention Not Indicated  Transportation Interventions Intervention Not Indicated       CCM Care Plan  Allergies  Allergen Reactions   Astelin [Azelastine Hcl] Palpitations    Medications Reviewed Today     Reviewed by Cherre Robins, PharmD (Pharmacist) on 12/30/20 at 1354  Med List Status: <None>   Medication Order Taking? Sig Documenting Provider Last Dose Status Informant  amLODipine (NORVASC) 2.5 MG tablet [id number] Yes Take 1 tablet (2.5 mg total) by mouth daily. Colon Branch, MD Taking Active   Calcium Carbonate (CALTRATE 600 PO) [id number] Yes Take 1 tablet by mouth daily. [provider] Taking Active   cholecalciferol (VITAMIN D3) 25 MCG (1000 UNIT) tablet [id number] Yes Take 1,000 Units by mouth daily. [provider] Taking Active   denosumab (PROLIA) 60 MG/ML SOLN injection [id number] Yes Inject 60 mg into the skin every 6 (six) months. Administer in upper arm, thigh, or abdomen [provider] Taking Active            Med Note (CANTER, [place] Royal Dec 04, 2020 10:13 AM)    Docusate Calcium (STOOL SOFTENER PO) [id number] Yes Take 1 capsule by mouth daily. [provider] Taking Active   FIBER PO [id number] Yes Take 2 tablets by mouth daily. [provider] Taking Active   fluticasone (FLONASE) 50 MCG/ACT nasal spray [id number] Yes Place 2 sprays into both nostrils daily as needed. Colon Branch, MD Taking Active            Med Note Alinda Money, Lindajo Royal Dec 04, 2020 10:13 AM)    Multiple Vitamins-Minerals (CENTRUM [place]) CHEW [id number] Yes Chew by mouth. [provider] Taking Active   pantoprazole (PROTONIX) 20 MG tablet [id number] Yes Take 1 tablet (20 mg total) by mouth daily before breakfast. Colon Branch, MD Taking Active              Patient Active Problem List   Diagnosis Date Noted   Hypertension, DX 08-2020 09/04/2020   Closed nondisplaced fracture of lateral malleolus of left fibula 01/23/2019   Closed left ankle fracture 01/2019   PCP NOTES >>>>>>>>>>>>>>>>>>>>> 04/01/2015   Lung disease--diffuse interstitial lung disease by x-ray 08-2014 09/05/2014   Anxiety 02/29/2012   Annual physical exam 12/11/2010   DJD (degenerative joint disease) 10/24/2009   Osteoporosis 06/05/2009    Immunization History  Administered Date(s) Administered   Fluad Quad(high Dose 65+) 03/16/2019   Influenza Split 02/29/2012   Influenza Whole 04/17/2008   Influenza, High Dose Seasonal PF 04/01/2015, 04/29/2016, 05/04/2017, 05/10/2018   Influenza,inj,Quad PF,6+  Mos 03/27/2014   Influenza-Unspecified 03/17/2020, 04/18/2020   PFIZER Comirnaty(Gray Top)Covid-19 Tri-Sucrose Vaccine 12/04/2020   PFIZER(Purple Top)SARS-COV-2 Vaccination 06/30/2019, 07/21/2019, 03/17/2020   Pneumococcal Conjugate-13 03/27/2014   Pneumococcal Polysaccharide-23 09/30/2004, 05/10/2018   Td 03/03/2001   Tdap 12/11/2010   Zoster Recombinat (Shingrix) 07/18/2018   Zoster, Live 11/29/2008    Conditions to be addressed/monitored: HTN and GERD, osteoporosis; DJD  Care Plan : General Pharmacy (Adult)  Updates made by Cherre Robins, PHARMD since 12/30/2020 12:00 AM     Problem: osteoporosis; HTN; HDL; GERD   Priority: Medium     Long-Range Goal: pharmacy goals for chronic conditions and medication management   Priority: Medium  Note:   Current Barriers:  Unable to maintain control of HTN   Pharmacist Clinical Goal(s):  Over the next 180 days, patient will achieve adherence to monitoring guidelines and medication adherence to achieve therapeutic efficacy achieve control of HTN as evidenced by BP <140/90  through collaboration with PharmD and provider.   Interventions: 1:1 collaboration with Colon Branch, MD regarding development and update of  comprehensive plan of care as evidenced by provider attestation and co-signature Inter-disciplinary care team collaboration (see longitudinal plan of care) Comprehensive medication review performed; medication list updated in electronic medical record  Hypertension BP Readings from Last 3 Encounters:  12/04/20 (!) 144/76  11/13/20 126/70  09/03/20 (!) 162/76  Variable BP in office;  BP goal <140/90 Checking BP daily at home Recent readings: 132/74; 147/76; 133/79; 137/77; 146/89; 118/78 Current regimen:  Amlodipine 2.41m daily  Interventions: Recommended to continue to check blood pressure daily, document, and provide at future appointments Ensure daily salt intake < 2300 mg/day Continue current medication for blood pressure  Osteoporosis Goal: reduce risk of fracture due to osteoporosis Dexa Results:  Forearm T-Score = -3.4 (2018); previously was -3.3 in 2016 and -3.2 in 2012 L femur T-Score = -1.7 (2018); previously was -1.8 in 2016 and -2.3 in 2012 Current regimen:  Prolia 648mmL every 6 months Calcium carbonate 60071maily Multivitamin - 1 tablet daily Vitamin D 1000 units daily Adequate calcium intake with supplementation and daily yogurt intake Interventions: Consider rechecking DEXA  Fall Risk assessment Continue current therapy Follow fall prevention tips  GERD:  Goal: decrease symptoms for reflux Patient denies recent episodes of reflux Current therapy:  Pantoprazole 106m57mily Interventions:  None - continue current therapy   Medication management Current pharmacy: Mail order/[facility] Interventions Comprehensive medication review performed. Continue current medication management strategy Focus on medication adherence by filling medications appropriately  Take medications as prescribed Report any questions or concerns to PharmD and/or provider(s)  Patient Goals/Self-Care Activities Over the next 180 days, patient will:  take medications as  prescribed, check blood pressure daily , document, and provide at future appointments, and continue to walk daily  Follow Up Plan: Telephone follow up appointment with care management team member scheduled for:  6 months        Medication Assistance: None required.  Patient affirms current coverage meets needs.  [facility] is:  [id number] [id number]: [phone]: [facility] [id number]: [phone]: [phone] Up:  Patient agrees to Care Plan and Follow-up.  Plan: Telephone follow up appointment with care management team  member scheduled for:  6 months  Cherre Robins, PharmD Clinical Pharmacist [facility] [phone]

## 2020-12-30 NOTE — Patient Instructions (Signed)
Visit Information  PATIENT GOALS:  Goals Addressed             This Visit's Progress    Chronic Care Management Pharmacy Care Plan       CARE PLAN ENTRY (see longitudinal plan of care for additional care plan information)  Current Barriers:  Chronic Disease Management support, education, and care coordination needs related to Hypertension, Osteoporosis, GERD, Ankle Pain   Hypertension BP Readings from Last 3 Encounters:  12/04/20 (!) 144/76  11/13/20 126/70  09/03/20 (!) 162/76  Pharmacist Clinical Goal(s): Over the next 90 days, patient will work with PharmD and providers to maintain BP goal <140/90 Current regimen:  Amlodipine 2.5mg  daily  Patient self care activities - Over the next 90 days, patient will: Check blood pressure daily, document, and provide at future appointments Ensure daily salt intake < 2300 mg/day Continue current medication for blood pressure  Osteoporosis Pharmacist Clinical Goal(s) Over the next 90 days, patient will work with PharmD and providers to reduce risk of fracture due to osteoporosis Current regimen:  Prolia 60mg /mL every 6 months Calcium carbonate 600mg  daily Multivitamin - 1 tablet daily Vitamin D 1000 units daily Interventions: Consider rechecking DEXA  Fall Risk assessment Patient self care activities - Over the next 90 days, patient will: Continue current therapy Follow fall prevention tips Recheck DEXA  Medication management Pharmacist Clinical Goal(s): Over the next 90 days, patient will work with PharmD and providers to maintain optimal medication adherence Current pharmacy: Mail order Interventions Comprehensive medication review performed. Continue current medication management strategy Patient self care activities - Over the next 90 days, patient will: Focus on medication adherence by filling medications appropriately  Take medications as prescribed Report any questions or concerns to PharmD and/or  provider(s)  Please see past updates related to this goal by clicking on the "Past Updates" button in the selected goal           Patient verbalizes understanding of instructions provided today and agrees to view in [place].   Telephone follow up appointment with care management team member scheduled for: 6 months  Cherre Robins, PharmD Clinical Pharmacist [facility]

## 2021-01-01 DIAGNOSIS — H3589 Other specified retinal disorders: Secondary | ICD-10-CM | POA: Diagnosis not present

## 2021-01-01 DIAGNOSIS — H353231 Exudative age-related macular degeneration, bilateral, with active choroidal neovascularization: Secondary | ICD-10-CM | POA: Insufficient documentation

## 2021-01-01 DIAGNOSIS — H35721 Serous detachment of retinal pigment epithelium, right eye: Secondary | ICD-10-CM | POA: Diagnosis not present

## 2021-01-05 DIAGNOSIS — H353231 Exudative age-related macular degeneration, bilateral, with active choroidal neovascularization: Secondary | ICD-10-CM | POA: Diagnosis not present

## 2021-01-05 DIAGNOSIS — H35721 Serous detachment of retinal pigment epithelium, right eye: Secondary | ICD-10-CM | POA: Diagnosis not present

## 2021-01-05 DIAGNOSIS — H3581 Retinal edema: Secondary | ICD-10-CM | POA: Diagnosis not present

## 2021-01-14 ENCOUNTER — Other Ambulatory Visit (HOSPITAL_BASED_OUTPATIENT_CLINIC_OR_DEPARTMENT_OTHER): Payer: Self-pay | Admitting: Internal Medicine

## 2021-01-14 DIAGNOSIS — Z1231 Encounter for screening mammogram for malignant neoplasm of breast: Secondary | ICD-10-CM

## 2021-02-02 DIAGNOSIS — H353231 Exudative age-related macular degeneration, bilateral, with active choroidal neovascularization: Secondary | ICD-10-CM | POA: Diagnosis not present

## 2021-02-02 DIAGNOSIS — H35721 Serous detachment of retinal pigment epithelium, right eye: Secondary | ICD-10-CM | POA: Diagnosis not present

## 2021-02-02 DIAGNOSIS — H3581 Retinal edema: Secondary | ICD-10-CM | POA: Diagnosis not present

## 2021-02-10 ENCOUNTER — Ambulatory Visit (HOSPITAL_BASED_OUTPATIENT_CLINIC_OR_DEPARTMENT_OTHER)
Admission: RE | Admit: 2021-02-10 | Discharge: 2021-02-10 | Disposition: A | Discharge status: Home / Self Care | Payer: Medicare HMO | Source: Ambulatory Visit | Attending: Internal Medicine | Admitting: Internal Medicine

## 2021-02-10 ENCOUNTER — Other Ambulatory Visit: Payer: Self-pay

## 2021-02-10 ENCOUNTER — Encounter (HOSPITAL_BASED_OUTPATIENT_CLINIC_OR_DEPARTMENT_OTHER): Payer: Self-pay

## 2021-02-10 DIAGNOSIS — Z1231 Encounter for screening mammogram for malignant neoplasm of breast: Secondary | ICD-10-CM | POA: Insufficient documentation

## 2021-03-03 DIAGNOSIS — H35721 Serous detachment of retinal pigment epithelium, right eye: Secondary | ICD-10-CM | POA: Diagnosis not present

## 2021-03-03 DIAGNOSIS — H3581 Retinal edema: Secondary | ICD-10-CM | POA: Diagnosis not present

## 2021-03-03 DIAGNOSIS — H353231 Exudative age-related macular degeneration, bilateral, with active choroidal neovascularization: Secondary | ICD-10-CM | POA: Diagnosis not present

## 2021-03-07 ENCOUNTER — Other Ambulatory Visit: Payer: Self-pay | Admitting: Internal Medicine

## 2021-03-11 DIAGNOSIS — M722 Plantar fascial fibromatosis: Secondary | ICD-10-CM | POA: Diagnosis not present

## 2021-03-11 DIAGNOSIS — M79671 Pain in right foot: Secondary | ICD-10-CM | POA: Diagnosis not present

## 2021-03-11 DIAGNOSIS — M7671 Peroneal tendinitis, right leg: Secondary | ICD-10-CM | POA: Diagnosis not present

## 2021-03-25 DIAGNOSIS — M79671 Pain in right foot: Secondary | ICD-10-CM | POA: Diagnosis not present

## 2021-03-25 DIAGNOSIS — B351 Tinea unguium: Secondary | ICD-10-CM | POA: Diagnosis not present

## 2021-03-25 DIAGNOSIS — M79672 Pain in left foot: Secondary | ICD-10-CM | POA: Diagnosis not present

## 2021-03-31 DIAGNOSIS — H35721 Serous detachment of retinal pigment epithelium, right eye: Secondary | ICD-10-CM | POA: Diagnosis not present

## 2021-03-31 DIAGNOSIS — H3581 Retinal edema: Secondary | ICD-10-CM | POA: Diagnosis not present

## 2021-03-31 DIAGNOSIS — H353231 Exudative age-related macular degeneration, bilateral, with active choroidal neovascularization: Secondary | ICD-10-CM | POA: Diagnosis not present

## 2021-04-03 ENCOUNTER — Telehealth: Payer: Self-pay | Admitting: Pharmacist

## 2021-04-03 NOTE — Chronic Care Management (AMB) (Signed)
    Chronic Care Management Pharmacy Assistant   Name: Kimberly Rodriguez  MRN: [id number] DOB: 09-Apr-1927  Kimberly Rodriguez is an 87 y.o. year old female who presents for her follow-up CCM visit with the clinical pharmacist.  Reason for Encounter: Disease State General  Recent office visits:  None noted  Recent consult visits:  03/03/21 Ophthalmology Dala Dock, MD- pt was seen for Exudative age-related macular degeneration of both eyes with active choroidal neovascularization ([place]ell[place]). Pt received aflibercept (EYLEA) intravitreal solution 2 mg  and tetracaine (PF) (PONTOCAINE) 0.5 % ophthalmic solution 1 drop in office and also received a eye exam. Follow up 03/31/21.  01/05/21 Opthamology Dala Dock, MD   - pt was seen for Initial consult Exudative age-related macular degeneration of both eyes with active choroidal . Pt will take part in a clinical trial for treatment or be referred to [facility] in [facility]. Pt advised to Return in about 4 weeks (around 02/02/2021) for IVE OD.   Hospital visits:  None in previous 6 months  Medications: Outpatient Encounter Medications as of 04/03/2021  Medication Sig   amLODipine (NORVASC) 2.5 MG tablet Take 1 tablet (2.5 mg total) by mouth daily.   Calcium Carbonate (CALTRATE 600 PO) Take 1 tablet by mouth daily.   cholecalciferol (VITAMIN D3) 25 MCG (1000 UNIT) tablet Take 1,000 Units by mouth daily.   denosumab (PROLIA) 60 MG/ML SOLN injection Inject 60 mg into the skin every 6 (six) months. Administer in upper arm, thigh, or abdomen   Docusate Calcium (STOOL SOFTENER PO) Take 1 capsule by mouth daily.   FIBER PO Take 2 tablets by mouth daily.   fluticasone (FLONASE) 50 MCG/ACT nasal spray Place 2 sprays into both nostrils daily as needed.   Multiple Vitamins-Minerals (CENTRUM [place]) CHEW Chew by mouth.   pantoprazole (PROTONIX) 20 MG tablet TAKE 1 TABLET (20 MG TOTAL) BY MOUTH DAILY BEFORE BREAKFAST.   No facility-administered  encounter medications on file as of 04/03/2021.    Have you had any problems recently with your health? Pt stated   Have you had any problems with your pharmacy?   What issues or side effects are you having with your medications?   What would you like me to pass along to [place] for them to help you with?    What can we do to take care of you better?   Star Rating Drugs: None Noted  [place] Clinical Pharmacist Assistant [phone]'

## 2021-04-17 ENCOUNTER — Telehealth: Payer: Self-pay

## 2021-04-17 NOTE — Telephone Encounter (Signed)
Pt Reverified for Prolia, awaiting Summary of Benefits. Will call and schedule for appointment once received and reviewed with pt.

## 2021-04-22 ENCOUNTER — Ambulatory Visit (INDEPENDENT_AMBULATORY_CARE_PROVIDER_SITE_OTHER): Payer: Medicare HMO

## 2021-04-22 VITALS — Ht 61.0 in | Wt 156.0 lb

## 2021-04-22 DIAGNOSIS — Z0000 Encounter for general adult medical examination without abnormal findings: Secondary | ICD-10-CM

## 2021-04-22 NOTE — Progress Notes (Signed)
Subjective:   Kimberly Rodriguez is a 87 y.o. female who presents for Medicare Annual (Subsequent) preventive examination.  I connected with Kimberly Rodriguez today by telephone and verified that I am speaking with the correct person using two identifiers. Location patient: home Location provider: work Persons participating in the virtual visit: patient, Marine scientist.    I discussed the limitations, risks, security and privacy concerns of performing an evaluation and management service by telephone and the availability of in person appointments. I also discussed with the patient that there may be a patient responsible charge related to this service. The patient expressed understanding and verbally consented to this telephonic visit.    Interactive audio and video telecommunications were attempted between this provider and patient, however failed, due to patient having technical difficulties OR patient did not have access to video capability.  We continued and completed visit with audio only.  Some vital signs may be absent or patient reported.   Time Spent with patient on telephone encounter: 25 minutes   Review of Systems     Cardiac Risk Factors include: advanced age (>20men, >86 women);hypertension;sedentary lifestyle     Objective:    Today's Vitals   04/22/21 1342  Weight: 156 lb (70.8 kg)  Height: 5\' 1"  (1.549 m)   Body mass index is 29.48 kg/m.  Advanced Directives 04/22/2021 12/06/2019 12/05/2018 03/08/2018 12/02/2017 12/01/2016  Does Patient Have a Medical Advance Directive? Yes Yes Yes Yes Yes Yes  Type of Paramedic of [place];Living will [place];Living will [place];Living will - [place];Living will [place];Living will  Does patient want to make changes to medical advance directive? - No - Patient declined No - Patient declined No - Patient declined - -  Copy of [place] in Chart? Yes - validated most recent copy scanned in chart (See row information) No - copy requested No - copy requested - No - copy requested No - copy requested    Current Medications (verified) Outpatient Encounter Medications as of 04/22/2021  Medication Sig   amLODipine (NORVASC) 2.5 MG tablet Take 1 tablet (2.5 mg total) by mouth daily.   Calcium Carbonate (CALTRATE 600 PO) Take 1 tablet by mouth daily.   cholecalciferol (VITAMIN D3) 25 MCG (1000 UNIT) tablet Take 1,000 Units by mouth daily.   denosumab (PROLIA) 60 MG/ML SOLN injection Inject 60 mg into the skin every 6 (six) months. Administer in upper arm, thigh, or abdomen   Docusate Calcium (STOOL SOFTENER PO) Take 1 capsule by mouth daily.   FIBER PO Take 2 tablets by mouth daily.   fluticasone (FLONASE) 50 MCG/ACT nasal spray Place 2 sprays into both nostrils daily as needed.   Multiple Vitamins-Minerals (CENTRUM SILVER) CHEW Chew by mouth.   pantoprazole (PROTONIX) 20 MG tablet TAKE 1 TABLET (20 MG TOTAL) BY MOUTH DAILY BEFORE BREAKFAST.   No facility-administered encounter medications on file as of 04/22/2021.    Allergies (verified) Astelin [azelastine hcl]   History: Past Medical History:  Diagnosis Date   Anemia    Blood transfusion without reported diagnosis    Cataract    Closed left ankle fracture 01/2019   no surgery   Eustachian tube dysfunction    Right side, Dr. Laurance Flatten, ENT   Microhematuria    s/p eval by urology 2008: U/S, CT, cysto w/ neg Bx (dx w/ a renal cyst, see reports)   Osteoarthritis    h/o back pain,  sees ortho s/p shots   Osteoporosis    Past Surgical History:  Procedure Laterality Date   ABDOMINAL HYSTERECTOMY     no oophorectomy per pt   APPENDECTOMY     COLONOSCOPY     DILATION AND CURETTAGE OF UTERUS     TONSILLECTOMY     Family History  Problem Relation Age of Onset   Heart disease Father    Prostate cancer Father    Heart disease Mother    Colon cancer Neg Hx     Breast cancer Neg Hx    Diabetes Neg Hx    Osteoporosis Neg Hx    Social History   Socioeconomic History   Marital status: Widowed    Spouse name: Not on file   Number of children: 0   Years of education: Not on file   Highest education level: Not on file  Occupational History   Occupation: retired  Tobacco Use   Smoking status: Former   Smokeless tobacco: Never  Substance and Sexual Activity   Alcohol use: Yes    Comment: socially -wine   Drug use: No   Sexual activity: Not on file  Other Topics Concern   Not on file  Social History Narrative   Household -- lives by herself in a townhouse    Still drives    Emergency contact-- Kimberly Rodriguez (sister in law) lives in [place]  [phone]   She is her health POA   Social Determinants of Health   Financial Resource Strain: Low Risk    Difficulty of Paying Living Expenses: Not hard at all  Food Insecurity: No Food Insecurity   Worried About Charity fundraiser in the Last Year: Never true   [place] in the Last Year: Never true  Transportation Needs: No Transportation Needs   Lack of Transportation (Medical): No   Lack of Transportation (Non-Medical): No  Physical Activity: Insufficiently Active   Days of Exercise per Week: 3 days   Minutes of Exercise per Session: 20 min  Stress: No Stress Concern Present   Feeling of Stress : Only a little  Social Connections: Moderately Integrated   Frequency of Communication with Friends and Family: More than three times a week   Frequency of Social Gatherings with Friends and Family: More than three times a week   Attends Religious Services: More than 4 times per year   Active Member of [employer] or Organizations: Yes   Attends Archivist Meetings: More than 4 times per year   Marital Status: Widowed    Tobacco Counseling Counseling given: Not Answered   Clinical Intake:  Pre-visit preparation completed: Yes  Pain : No/denies pain     BMI - recorded:  29.48 Nutritional Status: BMI 25 -29 Overweight Nutritional Risks: None Diabetes: No  How often do you need to have someone help you when you read instructions, pamphlets, or other written materials from your doctor or pharmacy?: 1 - Never  Diabetic?No  Interpreter Needed?: No  Information entered by :: Caroleen Hamman LPN   Activities of Daily Living In your present state of health, do you have any difficulty performing the following activities: 04/22/2021  Hearing? Y  Comment hearing aids  Vision? N  Difficulty concentrating or making decisions? N  Walking or climbing stairs? N  Dressing or bathing? N  Doing errands, shopping? N  Preparing Food and eating ? N  Using the Toilet? N  In the past six months, have you accidently leaked  urine? N  Do you have problems with loss of bowel control? N  Managing your Medications? N  Managing your Finances? N  Housekeeping or managing your Housekeeping? N  Some recent data might be hidden    Patient Care Team: Colon Branch, MD as PCP - General Roel Cluck, MD as Referring Physician (Ophthalmology) Laurance Flatten Velora Heckler., MD (Family Medicine) Cherre Robins, RPH-CPP (Pharmacist)  Indicate any recent Medical Services you may have received from other than [facility] providers in the past year (date may be approximate).     Assessment:   This is a routine wellness examination for [place].  Hearing/Vision screen Hearing Screening - Comments:: Bilateral hearing aids Vision Screening - Comments:: Last eye exam-07/2020-Dr. Teppidino  Dietary issues and exercise activities discussed: Current Exercise Habits: The patient does not participate in regular exercise at present, Intensity: Mild, Exercise limited by: None identified   Goals Addressed               This Visit's Progress     Patient Stated     Maintain current healthy lifestyle. (pt-stated)   On track     Other     DIET - [place]   On track     Increase  physical activity   Not on track     Do weightbearing exercises        Depression Screen PHQ 2/9 Scores 04/22/2021 09/03/2020 12/06/2019 12/05/2018 12/02/2017 05/04/2017 04/29/2016  PHQ - 2 Score 0 0 0 0 0 0 0    Fall Risk Fall Risk  04/22/2021 12/30/2020 09/03/2020 12/06/2019 12/05/2018  Falls in the past year? 0 0 0 1 0  Number falls in past yr: 0 0 0 0 -  Injury with Fall? 0 0 0 1 -  Follow up Falls prevention discussed Falls evaluation completed - Education provided;Falls prevention discussed -    FALL RISK PREVENTION PERTAINING TO THE HOME:  Any stairs in or around the home? No  Home free of loose throw rugs in walkways, pet beds, electrical cords, etc? Yes  Adequate lighting in your home to reduce risk of falls? Yes   ASSISTIVE DEVICES UTILIZED TO PREVENT FALLS:  Life alert? Yes  Use of a cane, walker or w/c? Yes occasionally Grab bars in the bathroom? Yes  Shower chair or bench in shower? No  Elevated toilet seat or a handicapped toilet? No   TIMED UP AND GO:  Was the test performed? No . Phone visit   Cognitive Function:Normal cognitive status assessed by  this Nurse Health Advisor. No abnormalities found.   MMSE - Mini Mental State Exam 12/02/2017 12/01/2016  Orientation to time 5 5  Orientation to Place 5 5  Registration 3 3  Attention/ Calculation 5 5  Recall 3 2  Language- name 2 objects 2 2  Language- repeat 1 1  Language- follow 3 step command 3 3  Language- read & follow direction 1 1  Write a sentence 1 1  Copy design 1 1  Total score 30 29        Immunizations Immunization History  Administered Date(s) Administered   Fluad Quad(high Dose 65+) 03/16/2019   Influenza Split 02/29/2012   Influenza Whole 04/17/2008   Influenza, High Dose Seasonal PF 04/01/2015, 04/29/2016, 05/04/2017, 05/10/2018   Influenza,inj,Quad PF,6+ Mos 03/27/2014   Influenza-Unspecified 03/17/2020, 04/18/2020   PFIZER Comirnaty(Gray Top)Covid-19 Tri-Sucrose Vaccine 12/04/2020    PFIZER(Purple Top)SARS-COV-2 Vaccination 06/30/2019, 07/21/2019, 03/17/2020   Pneumococcal Conjugate-13 03/27/2014   Pneumococcal  Polysaccharide-23 09/30/2004, 05/10/2018   Td 03/03/2001   Tdap 12/11/2010   Zoster Recombinat (Shingrix) 07/18/2018   Zoster, Live 11/29/2008    TDAP status: Due, Education has been provided regarding the importance of this vaccine. Advised may receive this vaccine at local pharmacy or Health Dept. Aware to provide a copy of the vaccination record if obtained from local pharmacy or Health Dept. Verbalized acceptance and understanding.  Flu Vaccine status: Due, Education has been provided regarding the importance of this vaccine. Advised may receive this vaccine at local pharmacy or Health Dept. Aware to provide a copy of the vaccination record if obtained from local pharmacy or Health Dept. Verbalized acceptance and understanding.  Pneumococcal vaccine status: Up to date  Covid-19 vaccine status: Information provided on how to obtain vaccines.   Qualifies for Shingles Vaccine? No   Zostavax completed Yes   Shingrix Completed?: Yes  Screening Tests Health Maintenance  Topic Date Due   Zoster Vaccines- Shingrix (2 of 2) 09/12/2018   TETANUS/TDAP  12/10/2020   INFLUENZA VACCINE  01/19/2021   COVID-19 Vaccine (5 - Booster for Pfizer series) 01/29/2021   Pneumonia Vaccine 14+ Years old  Completed   DEXA SCAN  Completed   HPV VACCINES  Aged Out    Health Maintenance  Health Maintenance Due  Topic Date Due   Zoster Vaccines- Shingrix (2 of 2) 09/12/2018   TETANUS/TDAP  12/10/2020   INFLUENZA VACCINE  01/19/2021   COVID-19 Vaccine (5 - Booster for Pfizer series) 01/29/2021    Colorectal cancer screening: No longer required.   Mammogram status: Completed bilateral 02/10/2021. Repeat every year  Bone Density status: Declined today  Lung Cancer Screening: (Low Dose CT Chest recommended if Age 73-80 years, 30 pack-year currently smoking OR have quit  w/in 15years.) does not qualify.     Additional Screening:  Hepatitis C Screening: does not qualify  Vision Screening: Recommended annual ophthalmology exams for early detection of glaucoma and other disorders of the eye. Is the patient up to date with their annual eye exam?  Yes  Who is the provider or what is the name of the office in which the patient attends annual eye exams? Dr. Gerre Scull   Dental Screening: Recommended annual dental exams for proper oral hygiene  Community Resource Referral / Chronic Care Management: CRR required this visit?  No   CCM required this visit?  No      Plan:     I have personally reviewed and noted the following in the patient's chart:   Medical and social history Use of alcohol, tobacco or illicit drugs  Current medications and supplements including opioid prescriptions.  Functional ability and status Nutritional status Physical activity Advanced directives List of other physicians Hospitalizations, surgeries, and ER visits in previous 12 months Vitals Screenings to include cognitive, depression, and falls Referrals and appointments  In addition, I have reviewed and discussed with patient certain preventive protocols, quality metrics, and best practice recommendations. A written personalized care plan for preventive services as well as general preventive health recommendations were provided to patient.   Due to this being a telephonic visit, the after visit summary with patients personalized plan was offered to patient via mail or my-chart. Patient would like to access on my-chart.  Marta Antu, LPN   15/10/2078  Nurse Health Advisor  Nurse Notes: None

## 2021-04-22 NOTE — Patient Instructions (Signed)
Kimberly Rodriguez , Thank you for taking time to complete your Medicare Wellness Visit. I appreciate your ongoing commitment to your health goals. Please review the following plan we discussed and let me know if I can assist you in the future.   Screening recommendations/referrals: Colonoscopy: No longer required Mammogram: Completed 02/10/2021-Due 02/10/2022 Bone Density: Due-Per our conversation, you will discuss with Dr. Larose Kells. Recommended yearly ophthalmology/optometry visit for glaucoma screening and checkup Recommended yearly dental visit for hygiene and checkup  Vaccinations: Influenza vaccine: Due-May obtain vaccine at our office or your local pharmacy. Pneumococcal vaccine: Up to date Tdap vaccine: Discuss with pharmacy Shingles vaccine: Completed vaccines   Covid-19:Booster available at the pharmacy.  Advanced directives: Copy in chart  Conditions/risks identified: See problem list  Next appointment: Follow up in one year for your annual wellness visit    Preventive Care 65 Years and Older, Female Preventive care refers to lifestyle choices and visits with your health care provider that can promote health and wellness. What does preventive care include? A yearly physical exam. This is also called an annual well check. Dental exams once or twice a year. Routine eye exams. Ask your health care provider how often you should have your eyes checked. Personal lifestyle choices, including: Daily care of your teeth and gums. Regular physical activity. Eating a healthy diet. Avoiding tobacco and drug use. Limiting alcohol use. Practicing safe sex. Taking low-dose aspirin every day. Taking vitamin and mineral supplements as recommended by your health care provider. What happens during an annual well check? The services and screenings done by your health care provider during your annual well check will depend on your age, overall health, lifestyle risk factors, and family history of  disease. Counseling  Your health care provider may ask you questions about your: Alcohol use. Tobacco use. Drug use. Emotional well-being. Home and relationship well-being. Sexual activity. Eating habits. History of falls. Memory and ability to understand (cognition). Work and work Statistician. Reproductive health. Screening  You may have the following tests or measurements: Height, weight, and BMI. Blood pressure. Lipid and cholesterol levels. These may be checked every 5 years, or more frequently if you are over 56 years old. Skin check. Lung cancer screening. You may have this screening every year starting at age 70 if you have a 30-pack-year history of smoking and currently smoke or have quit within the past 15 years. Fecal occult blood test (FOBT) of the stool. You may have this test every year starting at age 54. Flexible sigmoidoscopy or colonoscopy. You may have a sigmoidoscopy every 5 years or a colonoscopy every 10 years starting at age 50. Hepatitis C blood test. Hepatitis B blood test. Sexually transmitted disease (STD) testing. Diabetes screening. This is done by checking your blood sugar (glucose) after you have not eaten for a while (fasting). You may have this done every 1-3 years. Bone density scan. This is done to screen for osteoporosis. You may have this done starting at age 34. Mammogram. This may be done every 1-2 years. Talk to your health care provider about how often you should have regular mammograms. Talk with your health care provider about your test results, treatment options, and if necessary, the need for more tests. Vaccines  Your health care provider may recommend certain vaccines, such as: Influenza vaccine. This is recommended every year. Tetanus, diphtheria, and acellular pertussis (Tdap, Td) vaccine. You may need a Td booster every 10 years. Zoster vaccine. You may need this after age 72. Pneumococcal 13-valent  conjugate (PCV13) vaccine. One  dose is recommended after age 45. Pneumococcal polysaccharide (PPSV23) vaccine. One dose is recommended after age 109. Talk to your health care provider about which screenings and vaccines you need and how often you need them. This information is not intended to replace advice given to you by your health care provider. Make sure you discuss any questions you have with your health care provider. Document Released: 07/04/2015 Document Revised: 02/25/2016 Document Reviewed: 04/08/2015 Elsevier Interactive Patient Education  2017 [place] Prevention in the Home Falls can cause injuries. They can happen to people of all ages. There are many things you can do to make your home safe and to help prevent falls. What can I do on the outside of my home? Regularly fix the edges of walkways and driveways and fix any cracks. Remove anything that might make you trip as you walk through a door, such as a raised step or threshold. Trim any bushes or trees on the path to your home. Use bright outdoor lighting. Clear any walking paths of anything that might make someone trip, such as rocks or tools. Regularly check to see if handrails are loose or broken. Make sure that both sides of any steps have handrails. Any raised decks and porches should have guardrails on the edges. Have any leaves, snow, or ice cleared regularly. Use sand or salt on walking paths during winter. Clean up any spills in your garage right away. This includes oil or grease spills. What can I do in the bathroom? Use night lights. Install grab bars by the toilet and in the tub and shower. Do not use towel bars as grab bars. Use non-skid mats or decals in the tub or shower. If you need to sit down in the shower, use a plastic, non-slip stool. Keep the floor dry. Clean up any water that spills on the floor as soon as it happens. Remove soap buildup in the tub or shower regularly. Attach bath mats securely with double-sided  non-slip rug tape. Do not have throw rugs and other things on the floor that can make you trip. What can I do in the bedroom? Use night lights. Make sure that you have a light by your bed that is easy to reach. Do not use any sheets or blankets that are too big for your bed. They should not hang down onto the floor. Have a firm chair that has side arms. You can use this for support while you get dressed. Do not have throw rugs and other things on the floor that can make you trip. What can I do in the kitchen? Clean up any spills right away. Avoid walking on wet floors. Keep items that you use a lot in easy-to-reach places. If you need to reach something above you, use a strong step stool that has a grab bar. Keep electrical cords out of the way. Do not use floor polish or wax that makes floors slippery. If you must use wax, use non-skid floor wax. Do not have throw rugs and other things on the floor that can make you trip. What can I do with my stairs? Do not leave any items on the stairs. Make sure that there are handrails on both sides of the stairs and use them. Fix handrails that are broken or loose. Make sure that handrails are as long as the stairways. Check any carpeting to make sure that it is firmly attached to the stairs. Fix any carpet that  is loose or worn. Avoid having throw rugs at the top or bottom of the stairs. If you do have throw rugs, attach them to the floor with carpet tape. Make sure that you have a light switch at the top of the stairs and the bottom of the stairs. If you do not have them, ask someone to add them for you. What else can I do to help prevent falls? Wear shoes that: Do not have high heels. Have rubber bottoms. Are comfortable and fit you well. Are closed at the toe. Do not wear sandals. If you use a stepladder: Make sure that it is fully opened. Do not climb a closed stepladder. Make sure that both sides of the stepladder are locked into place. Ask  someone to hold it for you, if possible. Clearly mark and make sure that you can see: Any grab bars or handrails. First and last steps. Where the edge of each step is. Use tools that help you move around (mobility aids) if they are needed. These include: Canes. Walkers. Scooters. Crutches. Turn on the lights when you go into a dark area. Replace any light bulbs as soon as they burn out. Set up your furniture so you have a clear path. Avoid moving your furniture around. If any of your floors are uneven, fix them. If there are any pets around you, be aware of where they are. Review your medicines with your doctor. Some medicines can make you feel dizzy. This can increase your chance of falling. Ask your doctor what other things that you can do to help prevent falls. This information is not intended to replace advice given to you by your health care provider. Make sure you discuss any questions you have with your health care provider. Document Released: 04/03/2009 Document Revised: 11/13/2015 Document Reviewed: 07/12/2014 Elsevier Interactive Patient Education  2017 [employer].

## 2021-04-28 DIAGNOSIS — H353231 Exudative age-related macular degeneration, bilateral, with active choroidal neovascularization: Secondary | ICD-10-CM | POA: Diagnosis not present

## 2021-04-28 DIAGNOSIS — H35721 Serous detachment of retinal pigment epithelium, right eye: Secondary | ICD-10-CM | POA: Diagnosis not present

## 2021-04-28 DIAGNOSIS — H3581 Retinal edema: Secondary | ICD-10-CM | POA: Diagnosis not present

## 2021-05-12 ENCOUNTER — Telehealth: Payer: Self-pay

## 2021-05-13 NOTE — Telephone Encounter (Signed)
LMOM asking to see if Pt would like to move her appt next month to week of 12/19 so we can do Prolia injection on the same day. Asked that she call back to reschedule if she prefers.

## 2021-05-13 NOTE — Telephone Encounter (Signed)
PT due for next injection 06/06/21:  PA approved.  Pt will owe $290.00 at time of injection.  Okay to schedule.

## 2021-05-18 NOTE — Telephone Encounter (Signed)
Pt scheduled for 06/25/21 for Prolia

## 2021-05-25 ENCOUNTER — Other Ambulatory Visit: Payer: Self-pay | Admitting: Internal Medicine

## 2021-06-05 ENCOUNTER — Ambulatory Visit (INDEPENDENT_AMBULATORY_CARE_PROVIDER_SITE_OTHER): Payer: Medicare HMO | Admitting: Internal Medicine

## 2021-06-05 ENCOUNTER — Encounter: Payer: Self-pay | Admitting: Internal Medicine

## 2021-06-05 VITALS — BP 134/76 | HR 84 | Temp 97.8°F | Resp 18 | Ht 61.0 in | Wt 152.0 lb

## 2021-06-05 DIAGNOSIS — H353231 Exudative age-related macular degeneration, bilateral, with active choroidal neovascularization: Secondary | ICD-10-CM | POA: Diagnosis not present

## 2021-06-05 DIAGNOSIS — J984 Other disorders of lung: Secondary | ICD-10-CM | POA: Diagnosis not present

## 2021-06-05 DIAGNOSIS — H353221 Exudative age-related macular degeneration, left eye, with active choroidal neovascularization: Secondary | ICD-10-CM | POA: Diagnosis not present

## 2021-06-05 DIAGNOSIS — M810 Age-related osteoporosis without current pathological fracture: Secondary | ICD-10-CM | POA: Diagnosis not present

## 2021-06-05 DIAGNOSIS — I10 Essential (primary) hypertension: Secondary | ICD-10-CM | POA: Diagnosis not present

## 2021-06-05 DIAGNOSIS — Z23 Encounter for immunization: Secondary | ICD-10-CM

## 2021-06-05 DIAGNOSIS — H3581 Retinal edema: Secondary | ICD-10-CM | POA: Diagnosis not present

## 2021-06-05 DIAGNOSIS — H35721 Serous detachment of retinal pigment epithelium, right eye: Secondary | ICD-10-CM | POA: Diagnosis not present

## 2021-06-05 NOTE — Progress Notes (Signed)
Subjective:    Patient ID: Kimberly Rodriguez, female    DOB: [birth year], 87 y.o.   MRN: [id number]  DOS:  06/05/2021 Type of visit - description: Follow-up  Since the last office visit is doing well. Today with talk about hypertension, vaccinations and  patient education.  Reports no chest pain no difficulty breathing No cough.   Review of Systems See above   Past Medical History:  Diagnosis Date   Anemia    Blood transfusion without reported diagnosis    Cataract    Closed left ankle fracture 01/2019   no surgery   Eustachian tube dysfunction    Right side, Dr. Laurance Flatten, ENT   Microhematuria    s/p eval by urology 2008: U/S, CT, cysto w/ neg Bx (dx w/ a renal cyst, see reports)   Osteoarthritis    h/o back pain, sees ortho s/p shots   Osteoporosis     Past Surgical History:  Procedure Laterality Date   ABDOMINAL HYSTERECTOMY     no oophorectomy per pt   APPENDECTOMY     COLONOSCOPY     DILATION AND CURETTAGE OF UTERUS     TONSILLECTOMY      Allergies as of 06/05/2021       Reactions   Astelin [azelastine Hcl] Palpitations        Medication List        Accurate as of June 05, 2021 10:49 AM. If you have any questions, ask your nurse or doctor.          amLODipine 2.5 MG tablet Commonly known as: NORVASC TAKE 1 TABLET EVERY DAY   CALTRATE 600 PO Take 1 tablet by mouth daily.   Centrum Silver Chew Chew by mouth.   cholecalciferol 25 MCG (1000 UNIT) tablet Commonly known as: VITAMIN D3 Take 1,000 Units by mouth daily.   denosumab 60 MG/ML Soln injection Commonly known as: PROLIA Inject 60 mg into the skin every 6 (six) months. Administer in upper arm, thigh, or abdomen   FIBER PO Take 2 tablets by mouth daily.   fluticasone 50 MCG/ACT nasal spray Commonly known as: FLONASE Place 2 sprays into both nostrils daily as needed.   pantoprazole 20 MG tablet Commonly known as: PROTONIX TAKE 1 TABLET (20 MG TOTAL) BY MOUTH DAILY BEFORE  BREAKFAST.   STOOL SOFTENER PO Take 1 capsule by mouth daily.           Objective:   Physical Exam BP 134/76 (BP Location: Left Arm, Patient Position: Sitting, Cuff Size: Small)    Pulse 84    Temp 97.8 F (36.6 C) (Oral)    Resp 18    Ht 5\' 1"  (1.549 m)    Wt 152 lb (68.9 kg)    SpO2 96%    BMI 28.72 kg/m  General:   Well developed, NAD, BMI noted. HEENT:  Normocephalic . Face symmetric, atraumatic Lungs:  CTA B Normal respiratory effort, no intercostal retractions, no accessory muscle use. Heart: RRR,  no murmur.  Lower extremities: no pretibial edema bilaterally  Skin: Not pale. Not jaundice Neurologic:  alert & oriented X3.  Speech normal, gait appropriate for age and unassisted Psych--  Cognition and judgment appear intact.  Cooperative with normal attention span and concentration.  Behavior appropriate. No anxious or depressed appearing.      Assessment    Assessment HTN Interstitial lung dz per chest x-ray 08-2014, PFTs 08-2014: minimal obstruction MSK: --DJD --Neck pain --Back pain: h/o acupuncture before Osteoporosis:  T score 04-2015 -3.1, prolia #2  11-2015  T score -3.4 (10/2016), saw Dr. Loanne Drilling, work-up for secondary etiologies negative, rx Prolia 03/2018 Microhematuria  s/p eval by urology 2008: U/S, CT, cysto w/ neg Bx (dx w/ a renal cyst, see reports) ET dysfuntion saw ENT Dr Laurance Flatten 2016 Anemia, saw GI: 06-2016: Cscope: polyps, tubular adenoma; EGD: cameron lesions, no Bx H/o Anxiety Murmur: Echo 09/2019: Mild to moderate MR and AS   PLAN  HTN: On amlodipine, ambulatory BPs 130, 140.  Rarely in the 150s.  Recommend no change, last BMP satisfactory. Interstitial lung disease: Asymptomatic. Osteoporosis: Next Prolia 06-2021 Preventive care: Due for a Tdap and COVID-vaccine at her convenience Patient education: Going to [place] by car, recommend frequent stops and isometric exercises of the lower extremities to prevent clots. RTC 08/2021 CPX      This visit occurred during the SARS-CoV-2 public health emergency.  Safety protocols were in place, including screening questions prior to the visit, additional usage of staff PPE, and extensive cleaning of exam room while observing appropriate contact time as indicated for disinfecting solutions.

## 2021-06-05 NOTE — Patient Instructions (Addendum)
Enjoy your trip  to [place]   Check the  blood pressure regularly BP GOAL is between 110/65 and  135/85. If it is consistently higher or lower, let me know  Vaccines I recommend: COVID booster At tetanus shot called  Tdap, you can get it at your pharmacy  [place], [place] back for   a physical exam by 08-2021

## 2021-06-06 NOTE — Assessment & Plan Note (Signed)
HTN: On amlodipine, ambulatory BPs 130, 140.  Rarely in the 150s.  Recommend no change, last BMP satisfactory. Interstitial lung disease: Asymptomatic. Osteoporosis: Next Prolia 06-2021 Preventive care: Due for a Tdap and COVID-vaccine at her convenience Patient education: Going to [place] by car, recommend frequent stops and isometric exercises of the lower extremities to prevent clots. RTC 08/2021 CPX

## 2021-06-23 ENCOUNTER — Telehealth: Payer: Self-pay

## 2021-06-23 NOTE — Telephone Encounter (Signed)
Prolia VOB initiated via [email or web address]  Last OV:  Next OV:  Last Prolia inj: 12/04/20 Next Prolia inj DUE: 06/06/21

## 2021-06-24 DIAGNOSIS — M79671 Pain in right foot: Secondary | ICD-10-CM | POA: Diagnosis not present

## 2021-06-24 DIAGNOSIS — M79672 Pain in left foot: Secondary | ICD-10-CM | POA: Diagnosis not present

## 2021-06-24 DIAGNOSIS — B351 Tinea unguium: Secondary | ICD-10-CM | POA: Diagnosis not present

## 2021-06-25 ENCOUNTER — Ambulatory Visit (INDEPENDENT_AMBULATORY_CARE_PROVIDER_SITE_OTHER): Payer: Medicare HMO

## 2021-06-25 DIAGNOSIS — M810 Age-related osteoporosis without current pathological fracture: Secondary | ICD-10-CM | POA: Diagnosis not present

## 2021-06-25 MED ORDER — DENOSUMAB 60 MG/ML ~~LOC~~ SOSY
60.0000 mg | PREFILLED_SYRINGE | Freq: Once | SUBCUTANEOUS | Status: AC
  Administered 2021-06-25: 60 mg via SUBCUTANEOUS

## 2021-06-25 NOTE — Progress Notes (Signed)
Kimberly Rodriguez is a 88 y.o. female presents to the office today for prolia injections, per physician's orders.  Prolia (med), 60 mg/ml (dose),  subcutaneous (route) was administered left arm (location) today. Patient tolerated injection. Patient due for follow up labs/provider appt: Yes. Date due: 09-08-21, appt made Yes Patient next injection due: in 6 months, appt made No, she will be contacted for appointment when is due.   [name]

## 2021-06-25 NOTE — Telephone Encounter (Signed)
Prior auth initiated via [email or web address] KEY: [id number]

## 2021-07-02 ENCOUNTER — Ambulatory Visit (INDEPENDENT_AMBULATORY_CARE_PROVIDER_SITE_OTHER): Payer: Medicare HMO | Admitting: Pharmacist

## 2021-07-02 DIAGNOSIS — I10 Essential (primary) hypertension: Secondary | ICD-10-CM

## 2021-07-02 DIAGNOSIS — M810 Age-related osteoporosis without current pathological fracture: Secondary | ICD-10-CM

## 2021-07-02 NOTE — Patient Instructions (Signed)
Kimberly Rodriguez It was a pleasure speaking with you today.  I have attached a summary of our visit today and information about your health goals.   Our next appointment is in-person at @PCP @'s office on Friday, February 3rd at 1:45pm  Please call the care guide team at [phone] if you need to cancel or reschedule your appointment.   If you have any questions or concerns, please feel free to contact me either at the phone number below or with a MyChart message.   Keep up the good work!  Cherre Robins, PharmD Clinical Pharmacist [facility] [phone] (direct line)  [phone] (main office number)   CARE PLAN ENTRY   Hypertension BP Readings from Last 3 Encounters:  06/05/21 134/76  12/04/20 (!) 144/76  11/13/20 126/70   Pharmacist Clinical Goal(s): Over the next 90 days, patient will work with PharmD and providers to maintain BP goal <140/90 Current regimen:  Amlodipine 2.5mg  daily  Patient self care activities - Over the next 90 days, patient will: Check blood pressure daily, document, and provide at future appointments Ensure daily salt intake < 2300 mg/day Continue current medication for blood pressure  Osteoporosis Pharmacist Clinical Goal(s) Over the next 90 days, patient will work with PharmD and providers to reduce risk of fracture due to osteoporosis Current regimen:  Prolia 60mg /mL every 6 months Calcium carbonate 600mg  daily Multivitamin - 1 tablet daily Vitamin D 1000 units daily Interventions: Consider rechecking DEXA  Fall Risk assessment Patient self care activities - Over the next 90 days, patient will: Continue current therapy Follow fall prevention tips Recheck DEXA  Medication management Pharmacist Clinical Goal(s): Over the next 90 days, patient will work with PharmD and providers to maintain optimal medication adherence Current pharmacy: Mail order Interventions Comprehensive medication review  performed. Continue current medication management strategy Patient self care activities - Over the next 90 days, patient will: Focus on medication adherence by filling medications appropriately  I have sent you a catalog with [facility] over-the-counter items and how to order. Call if you have questions.  Take medications as prescribed Report any questions or concerns to PharmD and/or provider(s)  The patient verbalized understanding of instructions, educational materials, and care plan provided today and agreed to receive a mailed copy of patient instructions, educational materials, and care plan.

## 2021-07-02 NOTE — Chronic Care Management (AMB) (Signed)
Chronic Care Management Pharmacy Note  07/02/2021 Name:  Kimberly Rodriguez MRN:  [id number] DOB:  March 28, 1927   Subjective: Kimberly Rodriguez is an 88 y.o. year old female who is a primary patient of Paz, Alda Berthold, MD.  The CCM team was consulted for assistance with disease management and care coordination needs.    Engaged with patient by telephone for follow up visit in response to provider referral for pharmacy case management and/or care coordination services.   Consent to Services:  The patient was given information about Chronic Care Management services, agreed to services, and gave verbal consent prior to initiation of services.  Please see initial visit note for detailed documentation.   Patient Care Team: Colon Branch, MD as PCP - General Roel Cluck, MD as Referring Physician (Ophthalmology) Laurance Flatten Velora Heckler., MD (Family Medicine) Cherre Robins, RPH-CPP (Pharmacist)  Recent office visits: 06/05/2021 - Int Med (Dr Larose Kells) Follow up chronic conditions. No med changes noted.  12/04/2020 - PCP (DR Larose Kells)  Routine f/u; received Prolia injection and dose #4 of CVOID vaccine / 2nd Booster.    Recent consult visits: 06/05/2021-  Ophthalmology (Dr Manuella Ghazi) Seen for macular degeneration both eyes. Continue AREDS vitamins. Received Eylea injection left eye 04/28/2021 - Ophthalmology (Dr Manuella Ghazi) Seen for macular degeneration both eyes. Continue AREDS vitamins. Received Eylea injection left eye 03/31/2021 - Received Eylea injection left eye for AMD  03/03/21 Ophthalmology (Dr Manuella Ghazi) Seen macular degeneration of both eyes. Received aflibercept (EYLEA) intravitreal injection in right eye. Eye exam completed. Follow up 03/31/21. 01/05/21 Opthamology (Dr Manuella Ghazi) Initial consult for macular degeneration of both eyes. Pt will take part in a clinical trial for treatment or be referred to [facility]. Return in 4 weeks (around 02/02/2021) for IVE rigth eye  Hospital visits: None in previous 6  months  Objective:  Lab Results  Component Value Date   CREATININE 0.69 09/03/2020   CREATININE 0.70 05/15/2019   CREATININE 0.73 09/18/2018    Lab Results  Component Value Date   HGBA1C 5.5 10/28/2006   Last diabetic Eye exam: No results found for: HMDIABEYEEXA  Last diabetic Foot exam: No results found for: HMDIABFOOTEX      Component Value Date/Time   CHOL 170 09/03/2020 1055   TRIG 65.0 09/03/2020 1055   HDL 69.90 09/03/2020 1055   CHOLHDL 2 09/03/2020 1055   VLDL 13.0 09/03/2020 1055   LDLCALC 87 09/03/2020 1055   LDLDIRECT 125.0 08/14/2010 1105    Hepatic Function Latest Ref Rng & Units 09/03/2020 05/15/2019 09/18/2018  Total Protein 6.0 - 8.3 g/dL 6.3 6.2 6.1  Albumin 3.5 - 5.2 g/dL 3.8 3.7 4.0  AST 0 - 37 U/L '18 18 18  ' ALT 0 - 35 U/L '15 14 13  ' Alk Phosphatase 39 - 117 U/L 74 78 67  Total Bilirubin 0.2 - 1.2 mg/dL 0.4 0.5 0.3  Bilirubin, Direct 0.0 - 0.3 mg/dL - - -    Lab Results  Component Value Date/Time   TSH 2.53 09/18/2018 02:28 PM   TSH 2.39 04/29/2016 10:32 AM    CBC Latest Ref Rng & Units 09/03/2020 05/15/2019 09/18/2018  WBC 4.0 - 10.5 K/uL 8.5 6.6 7.4  Hemoglobin 12.0 - 15.0 g/dL 13.5 13.2 13.1  Hematocrit 36.0 - 46.0 % 40.8 39.9 39.3  Platelets 150.0 - 400.0 K/uL 225.0 193.0 212.0    Lab Results  Component Value Date/Time   VD25OH 54.15 07/12/2017 02:11 PM   VD25OH 57 12/11/2010 08:42  AM   VD25OH 47 08/14/2010 08:20 PM    Clinical ASCVD: No  The ASCVD Risk score (Arnett DK, et al., 2019) failed to calculate for the following reasons:   The 2019 ASCVD risk score is only valid for ages 37 to 65     Social History   Tobacco Use  Smoking Status Former  Smokeless Tobacco Never   BP Readings from Last 3 Encounters:  06/05/21 134/76  12/04/20 (!) 144/76  11/13/20 126/70   Pulse Readings from Last 3 Encounters:  06/05/21 84  12/04/20 89  11/13/20 85   Wt Readings from Last 3 Encounters:  06/05/21 152 lb (68.9 kg)  04/22/21 156  lb (70.8 kg)  12/04/20 156 lb 4 oz (70.9 kg)    Assessment: Review of patient past medical history, allergies, medications, health status, including review of consultants reports, laboratory and other test data, was performed as part of comprehensive evaluation and provision of chronic care management services.   SDOH:  (Social Determinants of Health) assessments and interventions performed:  SDOH Interventions    Flowsheet Row Most Recent Value  SDOH Interventions   Financial Strain Interventions Intervention Not Indicated  Physical Activity Interventions Intervention Not Indicated  Transportation Interventions Intervention Not Indicated       CCM Care Plan  Allergies  Allergen Reactions   Astelin [Azelastine Hcl] Palpitations    Medications Reviewed Today     Reviewed by Cherre Robins, RPH-CPP (Pharmacist) on 07/02/21 at 37  Med List Status: <None>   Medication Order Taking? Sig Documenting Provider Last Dose Status Informant  amLODipine (NORVASC) 2.5 MG tablet [id number] Yes TAKE 1 TABLET EVERY DAY Paz, Alda Berthold, MD Taking Active   Calcium Carbonate (CALTRATE 600 PO) [id number] Yes Take 1 tablet by mouth daily. [provider] Taking Active   cholecalciferol (VITAMIN D3) 25 MCG (1000 UNIT) tablet [id number] Yes Take 1,000 Units by mouth daily. [provider] Taking Active   denosumab (PROLIA) 60 MG/ML SOLN injection [id number] Yes Inject 60 mg into the skin every 6 (six) months. Administer in upper arm, thigh, or abdomen [provider] Taking Active            Med Note (CANTER, Lindajo Royal Dec 04, 2020 10:13 AM)    Docusate Calcium (STOOL SOFTENER PO) [id number] Yes Take 1 capsule by mouth daily. [provider] Taking Active   FIBER PO [id number] Yes Take 2 tablets by mouth daily. [provider] Taking Active   fluticasone (FLONASE) 50 MCG/ACT nasal spray [id number] Yes Place 2 sprays into both nostrils daily as needed. Colon Branch,  MD Taking Active            Med Note Alinda Money, Lindajo Royal Dec 04, 2020 10:13 AM)    Multiple Vitamins-Minerals (CENTRUM [place]) CHEW [id number] Yes Chew by mouth. [provider] Taking Active   multivitamin-lutein [facility]) CAPS capsule [id number] Yes Take 1 capsule by mouth daily. [provider] Taking Active   pantoprazole (PROTONIX) 20 MG tablet [id number] Yes TAKE 1 TABLET (20 MG TOTAL) BY MOUTH DAILY BEFORE BREAKFAST. Colon Branch, MD Taking Active             Patient Active Problem List   Diagnosis Date Noted   Exudative age-related macular degeneration of both eyes with active choroidal neovascularization ([place]) 01/01/2021   Hypertension, DX 08-2020 09/04/2020   Closed nondisplaced fracture of lateral malleolus of left fibula 01/23/2019   Closed left ankle fracture 01/2019  PCP NOTES >>>>>>>>>>>>>>>>>>>>> 04/01/2015   Lung disease--diffuse interstitial lung disease by x-ray 08-2014 09/05/2014   Anxiety 02/29/2012   Annual physical exam 12/11/2010   DJD (degenerative joint disease) 10/24/2009   Osteoporosis 06/05/2009    Immunization History  Administered Date(s) Administered   Fluad Quad(high Dose 65+) 03/16/2019, 06/05/2021   Influenza Split 02/29/2012   Influenza Whole 04/17/2008   Influenza, High Dose Seasonal PF 04/01/2015, 04/29/2016, 05/04/2017, 05/10/2018   Influenza,inj,Quad PF,6+ Mos 03/27/2014   Influenza-Unspecified 03/17/2020, 04/18/2020   PFIZER Comirnaty(Gray Top)Covid-19 Tri-Sucrose Vaccine 12/04/2020   PFIZER(Purple Top)SARS-COV-2 Vaccination 06/30/2019, 07/21/2019, 03/17/2020   Pneumococcal Conjugate-13 03/27/2014   Pneumococcal Polysaccharide-23 09/30/2004, 05/10/2018   Td 03/03/2001   Tdap 12/11/2010   Zoster Recombinat (Shingrix) 07/18/2018, 10/13/2020   Zoster, Live 11/29/2008    Conditions to be addressed/monitored: HTN and GERD, osteoporosis; DJD  Care Plan : General Pharmacy (Adult)  Updates made by Cherre Robins,  RPH-CPP since 07/02/2021 12:00 AM     Problem: osteoporosis; HTN; HDL; GERD   Priority: Medium     Long-Range Goal: pharmacy goals for chronic conditions and medication management   Priority: Medium  Note:   Current Barriers:  Unable to maintain control of HTN   Pharmacist Clinical Goal(s):  Over the next 180 days, patient will achieve adherence to monitoring guidelines and medication adherence to achieve therapeutic efficacy achieve control of HTN as evidenced by BP <140/90  through collaboration with PharmD and provider.   Interventions: 1:1 collaboration with Colon Branch, MD regarding development and update of comprehensive plan of care as evidenced by provider attestation and co-signature Inter-disciplinary care team collaboration (see longitudinal plan of care) Comprehensive medication review performed; medication list updated in electronic medical record  Hypertension Variable BP in office and at home;  BP goal <140/90 Checking BP daily at home Recent readings: 155/81; 129/72; 156/85; 148/75; 152/83; 142/76; 139/69; 131/74; 125/72 Heart rate: 53, 64, 89, 56, 68, 68, 56, 68, 71 Current regimen:  Amlodipine 2.38m daily  Interventions: Recommended to continue to check blood pressure daily, document, and provide at future appointments.  Patient will come into office to have blood pressure checked and bring her home blood pressure records and monitor to verify accuracy.  Ensure daily salt intake < 2300 mg/day Continue current medication for blood pressure  Osteoporosis Goal: reduce risk of fracture due to osteoporosis Dexa Results:  Forearm T-Score = -3.4 (2018); previously was -3.3 in 2016 and -3.2 in 2012 L femur T-Score = -1.7 (2018); previously was -1.8 in 2016 and -2.3 in 2012 Current regimen:  Prolia 619mmL every 6 months Calcium carbonate 60060maily Multivitamin - 1 tablet daily Vitamin D 1000 units daily Adequate calcium intake with supplementation and daily  yogurt intake Interventions: Consider rechecking DEXA  Fall Risk assessment Continue current therapy Follow fall prevention tips  GERD:  Goal: decrease symptoms for reflux Patient denies recent episodes of reflux Current therapy:  Pantoprazole [id number] Interventions:  None - continue current therapy   Medication management Current pharmacy: Mail order/[facility] Interventions Comprehensive medication review performed. Continue current medication management strategy Focus on medication adherence by filling medications appropriately  Updated med list - added Ocuvit vitamin Discussed over-the-counter benefits available through [facility] and information on how to order over-the-counter items.  Take medications as prescribed Report any questions or concerns to PharmD and/or provider(s)  Patient Goals/Self-Care Activities Over the next 180 days, patient will:  take medications as prescribed, check blood pressure daily , document, and provide at  future appointments, and continue to walk daily  Follow Up Plan: Face to Face appointment with care management team member scheduled for: 07/24/2021 to check BP        Medication Assistance: None required.  Patient affirms current coverage meets needs.  Patient's preferred pharmacy is:  [id number] - [place], [place] - [address] [place] [address] Phone: [phone] Fax: [phone]  [facility] - [place], [address] [place] [address] Phone: [phone] Fax: [phone]   Follow Up:  Patient agrees to Care Plan and Follow-up.  Plan: Face to Face appointment with care management team member scheduled for: 07/24/2021  Cherre Robins, PharmD Clinical Pharmacist [facility] [phone]

## 2021-07-03 NOTE — Telephone Encounter (Addendum)
° ° ° °  PA approved KEY: [id number] - PA Case ID: [id number]

## 2021-07-09 NOTE — Telephone Encounter (Signed)
OPEN IN ERROR 

## 2021-07-09 NOTE — Telephone Encounter (Signed)
Last PROLIA inj 06/25/21 Next PROLIA inj due 12/24/21

## 2021-07-09 NOTE — Telephone Encounter (Signed)
Pt ready for scheduling on or after 06/21/21  Out-of-pocket cost due at time of visit: $301  Primary: [employer] Prolia co-insurance: 20% (approximately $276) Admin fee co-insurance: 20% (approximately $25)  Secondary: n/a Prolia co-insurance:  Admin fee co-insurance:  Des not apply Deductible:   Prior Auth: APPROVED PA# [id number] Need Valid: 06/21/21-06/20/22  ** This summary of benefits is an estimation of the patient's out-of-pocket cost. Exact cost may vary based on individual plan coverage.

## 2021-07-21 DIAGNOSIS — M810 Age-related osteoporosis without current pathological fracture: Secondary | ICD-10-CM

## 2021-07-21 DIAGNOSIS — I10 Essential (primary) hypertension: Secondary | ICD-10-CM | POA: Diagnosis not present

## 2021-07-21 DIAGNOSIS — H35721 Serous detachment of retinal pigment epithelium, right eye: Secondary | ICD-10-CM | POA: Diagnosis not present

## 2021-07-21 DIAGNOSIS — H3581 Retinal edema: Secondary | ICD-10-CM | POA: Diagnosis not present

## 2021-07-21 DIAGNOSIS — H353231 Exudative age-related macular degeneration, bilateral, with active choroidal neovascularization: Secondary | ICD-10-CM | POA: Diagnosis not present

## 2021-07-24 ENCOUNTER — Ambulatory Visit: Payer: Medicare HMO

## 2021-07-27 ENCOUNTER — Ambulatory Visit (INDEPENDENT_AMBULATORY_CARE_PROVIDER_SITE_OTHER): Payer: Medicare HMO | Admitting: Pharmacist

## 2021-07-27 VITALS — BP 142/62 | HR 55 | Ht 61.0 in | Wt 150.5 lb

## 2021-07-27 DIAGNOSIS — I10 Essential (primary) hypertension: Secondary | ICD-10-CM

## 2021-07-27 DIAGNOSIS — M810 Age-related osteoporosis without current pathological fracture: Secondary | ICD-10-CM

## 2021-07-27 NOTE — Chronic Care Management (AMB) (Signed)
Chronic Care Management Pharmacy Note  07/27/2021 Name:  Kimberly Rodriguez MRN:  [id number] DOB:  08-Sep-1926  Summary:  Blood pressure at home is 50% at goal and when not at goal, SBP is usually in low 140's. Patient home blood pressure is measuring a little higher than in office blood pressure monitor.  Recommended she use [employer] over-the-counter benefits to purchase new blood pressure cuff.  Continue to check blood pressure daily and take amlodipine 2.29m daily   Subjective: Kimberly Rodriguez an 88y.o. year old female who is a primary patient of Paz, JAlda Berthold MD.  The CCM team was consulted for assistance with disease management and care coordination needs.    Engaged with patient by telephone for follow up visit in response to provider referral for pharmacy case management and/or care coordination services.   Consent to Services:  The patient was given information about Chronic Care Management services, agreed to services, and gave verbal consent prior to initiation of services.  Please see initial visit note for detailed documentation.   Patient Care Team: PColon Branch MD as PCP - General TRoel Cluck MD as Referring Physician (Ophthalmology) MLaurance FlattenDVelora Heckler, MD (Family Medicine) ECherre Robins RPH-CPP (Pharmacist)  Recent office visits: 06/05/2021 - Int Med (Dr PLarose Kells Follow up chronic conditions. No med changes noted.  12/04/2020 - PCP (DR PLarose Kells  Routine f/u; received Prolia injection and dose #4 of CVOID vaccine / 2nd Booster.    Recent consult visits: 06/05/2021-  Ophthalmology (Dr SManuella Ghazi Seen for macular degeneration both eyes. Continue AREDS vitamins. Received Eylea injection left eye 04/28/2021 - Ophthalmology (Dr SManuella Ghazi Seen for macular degeneration both eyes. Continue AREDS vitamins. Received Eylea injection left eye 03/31/2021 - Received Eylea injection left eye for AMD  03/03/21 Ophthalmology (Dr SManuella Ghazi Seen macular degeneration of both eyes. Received  aflibercept (EYLEA) intravitreal injection in right eye. Eye exam completed. Follow up 03/31/21. 01/05/21 Opthamology (Dr SManuella Ghazi Initial consult for macular degeneration of both eyes. Pt will take part in a clinical trial for treatment [facility] (around 02/02/2021) for IVE rigth eye  Hospital visits: None in previous 6 months  Objective:  Lab Results  Component Value Date   CREATININE 0.69 09/03/2020   CREATININE 0.70 05/15/2019   CREATININE 0.73 09/18/2018    Lab Results  Component Value Date   HGBA1C 5.5 10/28/2006   Last diabetic Eye exam: No results found for: HMDIABEYEEXA  Last diabetic Foot exam: No results found for: HMDIABFOOTEX      Component Value Date/Time   CHOL 170 09/03/2020 1055   TRIG 65.0 09/03/2020 1055   HDL 69.90 09/03/2020 1055   CHOLHDL 2 09/03/2020 1055   VLDL 13.0 09/03/2020 1055   LDLCALC 87 09/03/2020 1055   LDLDIRECT 125.0 08/14/2010 1105    Hepatic Function Latest Ref Rng & Units 09/03/2020 05/15/2019 09/18/2018  Total Protein 6.0 - 8.3 g/dL 6.3 6.2 6.1  Albumin 3.5 - 5.2 g/dL 3.8 3.7 4.0  AST 0 - 37 U/L '18 18 18  ' ALT 0 - 35 U/L '15 14 13  ' Alk Phosphatase 39 - 117 U/L 74 78 67  Total Bilirubin 0.2 - 1.2 mg/dL 0.4 0.5 0.3  Bilirubin, Direct 0.0 - 0.3 mg/dL - - -    Lab Results  Component Value Date/Time   TSH 2.53 09/18/2018 02:28 PM   TSH 2.39 04/29/2016 10:32 AM    CBC Latest Ref Rng &  Units 09/03/2020 05/15/2019 09/18/2018  WBC 4.0 - 10.5 K/uL 8.5 6.6 7.4  Hemoglobin 12.0 - 15.0 g/dL 13.5 13.2 13.1  Hematocrit 36.0 - 46.0 % 40.8 39.9 39.3  Platelets 150.0 - 400.0 K/uL 225.0 193.0 212.0    Lab Results  Component Value Date/Time   VD25OH 54.15 07/12/2017 02:11 PM   VD25OH 57 12/11/2010 08:42 AM   VD25OH 47 08/14/2010 08:20 PM    Clinical ASCVD: No  The ASCVD Risk score (Arnett DK, et al., 2019) failed to calculate for the following reasons:   The 2019 ASCVD risk score is only valid  for ages 37 to 37     Social History   Tobacco Use  Smoking Status Former  Smokeless Tobacco Never   BP Readings from Last 3 Encounters:  07/27/21 (!) 142/62  06/05/21 134/76  12/04/20 (!) 144/76   Pulse Readings from Last 3 Encounters:  07/27/21 (!) 55  06/05/21 84  12/04/20 89   Wt Readings from Last 3 Encounters:  07/27/21 150 lb 8 oz (68.3 kg)  06/05/21 152 lb (68.9 kg)  04/22/21 156 lb (70.8 kg)    Assessment: Review of patient past medical history, allergies, medications, health status, including review of consultants reports, laboratory and other test data, was performed as part of comprehensive evaluation and provision of chronic care management services.   SDOH:  (Social Determinants of Health) assessments and interventions performed:     CCM Care Plan  Allergies  Allergen Reactions   Astelin [Azelastine Hcl] Palpitations    Medications Reviewed Today     Reviewed by Cherre Robins, RPH-CPP (Pharmacist) on 07/27/21 at 53  Med List Status: <None>   Medication Order Taking? Sig Documenting Provider Last Dose Status Informant  amLODipine (NORVASC) 2.5 MG tablet [id number] Yes TAKE 1 TABLET EVERY DAY Paz, Alda Berthold, MD Taking Active   Calcium Carbonate (CALTRATE 600 PO) [id number] Yes Take 1 tablet by mouth daily. [provider] Taking Active   cholecalciferol (VITAMIN D3) 25 MCG (1000 UNIT) tablet [id number] Yes Take 1,000 Units by mouth daily. [provider] Taking Active   denosumab (PROLIA) 60 MG/ML SOLN injection [id number] Yes Inject 60 mg into the skin every 6 (six) months. Administer in upper arm, thigh, or abdomen [provider] Taking Active            Med Note (CANTER, Lindajo Royal Dec 04, 2020 10:13 AM)    Docusate Calcium (STOOL SOFTENER PO) [id number] Yes Take 1 capsule by mouth daily. [provider] Taking Active   FIBER PO [id number] Yes Take 2 tablets by mouth daily. [provider] Taking Active    fluticasone (FLONASE) 50 MCG/ACT nasal spray [id number] Yes Place 2 sprays into both nostrils daily as needed. Colon Branch, MD Taking Active            Med Note Alinda Money, Lindajo Royal Dec 04, 2020 10:13 AM)    Multiple Vitamins-Minerals (CENTRUM [place]) CHEW [id number] Yes Chew by mouth. [provider] Taking Active   multivitamin-lutein [facility] CAPS capsule [id number] Yes Take 1 capsule by mouth daily. [provider] Taking Active   pantoprazole (PROTONIX) 20 MG tablet [id number] Yes TAKE 1 TABLET (20 MG TOTAL) BY MOUTH DAILY BEFORE BREAKFAST. Colon Branch, MD Taking Active             Patient Active Problem List   Diagnosis Date Noted   Exudative age-related macular degeneration of both eyes with active choroidal neovascularization (  [place]) 01/01/2021   Hypertension, DX 08-2020 09/04/2020   Closed nondisplaced fracture of lateral malleolus of left fibula 01/23/2019   Closed left ankle fracture 01/2019   PCP NOTES >>>>>>>>>>>>>>>>>>>>> 04/01/2015   Lung disease--diffuse interstitial lung disease by x-ray 08-2014 09/05/2014   Anxiety 02/29/2012   Annual physical exam 12/11/2010   DJD (degenerative joint disease) 10/24/2009   Osteoporosis 06/05/2009    Immunization History  Administered Date(s) Administered   Fluad Quad(high Dose 65+) 03/16/2019, 06/05/2021   Influenza Split 02/29/2012   Influenza Whole 04/17/2008   Influenza, High Dose Seasonal PF 04/01/2015, 04/29/2016, 05/04/2017, 05/10/2018   Influenza,inj,Quad PF,6+ Mos 03/27/2014   Influenza-Unspecified 03/17/2020, 04/18/2020   PFIZER Comirnaty(Gray Top)Covid-19 Tri-Sucrose Vaccine 12/04/2020   PFIZER(Purple Top)SARS-COV-2 Vaccination 06/30/2019, 07/21/2019, 03/17/2020   Pneumococcal Conjugate-13 03/27/2014   Pneumococcal Polysaccharide-23 09/30/2004, 05/10/2018   Td 03/03/2001   Tdap 12/11/2010   Zoster Recombinat (Shingrix) 07/18/2018, 10/13/2020   Zoster, Live 11/29/2008    Conditions to be  addressed/monitored: HTN and GERD, osteoporosis; DJD  Care Plan : General Pharmacy (Adult)  Updates made by Cherre Robins, RPH-CPP since 07/27/2021 12:00 AM     Problem: osteoporosis; HTN; HDL; GERD   Priority: Medium     Long-Range Goal: pharmacy goals for chronic conditions and medication management   Priority: Medium  Note:   Current Barriers:  Unable to maintain control of HTN   Pharmacist Clinical Goal(s):  Over the next 180 days, patient will achieve adherence to monitoring guidelines and medication adherence to achieve therapeutic efficacy achieve control of HTN as evidenced by BP <140/90  through collaboration with PharmD and provider.   Interventions: 1:1 collaboration with Colon Branch, MD regarding development and update of comprehensive plan of care as evidenced by provider attestation and co-signature Inter-disciplinary care team collaboration (see longitudinal plan of care) Comprehensive medication review performed; medication list updated in electronic medical record  Hypertension:  Variable BP in office and at home;  BP goal <140/90 Patient's blood pressure with office blood pressure monitor was 148/60 - left arm and 142/62 right arm Patient's blood pressure with her home cuff was 165/87 - left arm and 149/74 right arm. Checking BP daily at home Recent readings: 135/76; 137/76; 144/81; 155/82; 145/85; 130/68; 133/77; 149/74; 147/60; 142/78; 147/77; 142/80; 138/73; 144/74; 130/74; 136/75 Average SBP = 140.8 Average DBP = 75.6 Heart rate range 59 to 85 (average HR = 71) Current regimen:  Amlodipine 2.64m daily  Interventions: Recommended to continue to check blood pressure every other day, document, and provide at future appointments.  Blood pressure was slightly elevated with her monitor versus office monitor. Recommended she [facility]. (Her current monitor is over 563years old)  Ensure daily salt intake <  2300 mg/day Continue current medication for blood pressure  Osteoporosis Goal: reduce risk of fracture due to osteoporosis Dexa Results:  Forearm T-Score = -3.4 (2018); previously was -3.3 in 2016 and -3.2 in 2012 L femur T-Score = -1.7 (2018); previously was -1.8 in 2016 and -2.3 in 2012 Current regimen:  Prolia 654mmL every 6 months Calcium carbonate 60018maily Multivitamin - 1 tablet daily Vitamin D 1000 units daily Adequate calcium intake with supplementation and daily yogurt intake Interventions: Consider rechecking DEXA  Fall Risk assessment Continue current therapy Follow fall prevention tips  GERD:  Goal: decrease symptoms for reflux Patient denies recent episodes of reflux Current therapy:  Pantoprazole 80m54mily Interventions:  None - continue current therapy   Medication management Current  pharmacy: [facility] Interventions Comprehensive medication review performed. Continue current medication management strategy Focus on medication adherence by filling medications appropriately  Updated med list - added Ocuvit vitamin Discussed over-the-counter benefits available through [facility]. Mailed patient catalog and information on how to order over-the-counter items. (Completed at previous visit)  Take medications as prescribed Report any questions or concerns to PharmD and/or provider(s)  Patient Goals/Self-Care Activities Over the next 180 days, patient will:  take medications as prescribed, check blood pressure daily , document, and provide at future appointments, and continue to walk daily  Follow Up Plan: Face to Face appointment with care management team member scheduled for: 3 to 4 months.         Medication Assistance: None required.  Patient affirms current coverage meets needs.  Patient's preferred pharmacy is:  [id number] - [place], [place] - [address] [place]  [address] Phone: [phone] Fax: [phone]  [facility] - [place], [place] [address] Phone: [phone] Fax: [phone]   Follow Up:  Patient agrees to Care Plan and Follow-up.  Plan: Face to Face appointment with care management team member scheduled for: 2 to 3 months. Will see PCP 08/2021 for annual physical  Cherre Robins, PharmD Clinical Pharmacist [place] [phone]

## 2021-07-27 NOTE — Patient Instructions (Signed)
Kimberly Rodriguez It was a pleasure speaking with you today.  I have attached a summary of our visit today and information about your health goals.    If you have any questions or concerns, please feel free to contact me either at the phone number below or with a MyChart message.   Keep up the good work!  Cherre Robins, PharmD Clinical Pharmacist [facility] [phone] (direct line)  [phone] (main office number)  CARE PLAN ENTRY (see longitudinal plan of care for additional care plan information)  Current Barriers:  Chronic Disease Management support, education, and care coordination needs related to Hypertension, Osteoporosis, GERD, Ankle Pain   Hypertension BP Readings from Last 3 Encounters:  07/27/21 (!) 142/62  06/05/21 134/76  12/04/20 (!) 144/76   Pharmacist Clinical Goal(s): Over the next 90 days, patient will work with PharmD and providers to maintain BP goal <140/90 Current regimen:  Amlodipine 2.5mg  daily  Patient self care activities - Over the next 90 days, patient will: Check blood pressure daily, document, and provide at future appointments Ensure daily salt intake < 2300 mg/day Continue current medication for blood pressure  Osteoporosis Pharmacist Clinical Goal(s) Over the next 90 days, patient will work with PharmD and providers to reduce risk of fracture due to osteoporosis Current regimen:  Prolia 60mg /mL every 6 months Calcium carbonate 600mg  daily Multivitamin - 1 tablet daily Vitamin D 1000 units daily Interventions: Consider rechecking DEXA  Fall Risk assessment Patient self care activities - Over the next 90 days, patient will: Continue current therapy Follow fall prevention tips Recheck DEXA  Medication management Pharmacist Clinical Goal(s): Over the next 90 days, patient will work with PharmD and providers to maintain optimal medication adherence Current pharmacy: Mail  order Interventions Comprehensive medication review performed. Continue current medication management strategy Patient self care activities - Over the next 90 days, patient will: Focus on medication adherence by filling medications appropriately  I have sent you a catalog with [facility] over-the-counter items and how to order. Call if you have questions.  Take medications as prescribed Report any questions or concerns to PharmD and/or provider(s)  Please see past updates related to this goal by clicking on the "Past Updates" button in the selected goal   Patient verbalizes understanding of instructions and care plan provided today and agrees to view in [place]. Active MyChart status confirmed with patient.

## 2021-08-17 DIAGNOSIS — H353231 Exudative age-related macular degeneration, bilateral, with active choroidal neovascularization: Secondary | ICD-10-CM | POA: Diagnosis not present

## 2021-08-17 DIAGNOSIS — H5203 Hypermetropia, bilateral: Secondary | ICD-10-CM | POA: Diagnosis not present

## 2021-08-17 DIAGNOSIS — H2512 Age-related nuclear cataract, left eye: Secondary | ICD-10-CM | POA: Diagnosis not present

## 2021-08-17 DIAGNOSIS — H524 Presbyopia: Secondary | ICD-10-CM | POA: Diagnosis not present

## 2021-08-17 DIAGNOSIS — H43813 Vitreous degeneration, bilateral: Secondary | ICD-10-CM | POA: Diagnosis not present

## 2021-08-17 DIAGNOSIS — H18413 Arcus senilis, bilateral: Secondary | ICD-10-CM | POA: Diagnosis not present

## 2021-08-17 DIAGNOSIS — H18513 Endothelial corneal dystrophy, bilateral: Secondary | ICD-10-CM | POA: Diagnosis not present

## 2021-08-17 DIAGNOSIS — H43393 Other vitreous opacities, bilateral: Secondary | ICD-10-CM | POA: Diagnosis not present

## 2021-08-17 DIAGNOSIS — H52203 Unspecified astigmatism, bilateral: Secondary | ICD-10-CM | POA: Diagnosis not present

## 2021-08-18 DIAGNOSIS — I10 Essential (primary) hypertension: Secondary | ICD-10-CM

## 2021-08-18 DIAGNOSIS — H35721 Serous detachment of retinal pigment epithelium, right eye: Secondary | ICD-10-CM | POA: Diagnosis not present

## 2021-08-18 DIAGNOSIS — H353231 Exudative age-related macular degeneration, bilateral, with active choroidal neovascularization: Secondary | ICD-10-CM | POA: Diagnosis not present

## 2021-08-18 DIAGNOSIS — M810 Age-related osteoporosis without current pathological fracture: Secondary | ICD-10-CM

## 2021-08-18 DIAGNOSIS — H3581 Retinal edema: Secondary | ICD-10-CM | POA: Diagnosis not present

## 2021-09-08 ENCOUNTER — Encounter: Payer: Self-pay | Admitting: Internal Medicine

## 2021-09-08 ENCOUNTER — Ambulatory Visit (INDEPENDENT_AMBULATORY_CARE_PROVIDER_SITE_OTHER): Payer: Medicare HMO | Admitting: Internal Medicine

## 2021-09-08 VITALS — BP 132/82 | HR 72 | Temp 98.0°F | Resp 16 | Ht 61.0 in | Wt 147.4 lb

## 2021-09-08 DIAGNOSIS — M810 Age-related osteoporosis without current pathological fracture: Secondary | ICD-10-CM | POA: Diagnosis not present

## 2021-09-08 DIAGNOSIS — I10 Essential (primary) hypertension: Secondary | ICD-10-CM | POA: Diagnosis not present

## 2021-09-08 DIAGNOSIS — Z0000 Encounter for general adult medical examination without abnormal findings: Secondary | ICD-10-CM | POA: Diagnosis not present

## 2021-09-08 DIAGNOSIS — R7989 Other specified abnormal findings of blood chemistry: Secondary | ICD-10-CM

## 2021-09-08 NOTE — Assessment & Plan Note (Signed)
Here for CPX ?HTN: Ambulatory BPs in the 140s, very rarely goes higher or lower.  Were checking labs, continue amlodipine. ?Interstitial lung disease: ?Few rhonchi on exam, no DOE, no cough.stable. ?Osteoporosis: On Prolia, no history of vitamin D deficiency, check DEXA. ?Heart murmur: Mild AS, recheck a echo on RTC.  No symptoms. ?Abnormal CBC: Last few CBCs show increased monocytes, check a blood smear. ?Weight loss: Mild, see HPI, reassess on RTC.  Checking labs ?RTC 6 months ? ?

## 2021-09-08 NOTE — Assessment & Plan Note (Signed)
-  Td  2012.  Booster recommended ?- PNM 23: 2006 and 04/2018; prevnar:2015 ?- zostavax 2010 ?- shingrix- x2  ?- covid vax : booster d/w pt  ?-No further cervical ,Breast or colon cancer screening (did have a colonoscopy 06-2016 D/T anemia). Did have a MMG 01-2021 ?-Fall prevention discussed ?-Labs reviewed, last cholesterol has been very good, check a CMP, CBC with a smear, TSH. ?- advance directives : on file  ? ?

## 2021-09-08 NOTE — Progress Notes (Signed)
? ?Subjective:  ? ? Patient ID: Kimberly Rodriguez, female    DOB: Jul 26, 1926, 88 y.o.   MRN: [id number] ? ?DOS:  09/08/2021 ?Type of visit - description: CPX ? ?Here for CPX. ?In general she feels about the same, she reports is loosing some ground, thinks age related  but denies specifically chest pain, palpitations, leg swelling. ?No recent falls. ?Emotionally doing well. ? ? ?Wt Readings from Last 3 Encounters:  ?09/08/21 147 lb 6 oz (66.8 kg)  ?07/27/21 150 lb 8 oz (68.3 kg)  ?06/05/21 152 lb (68.9 kg)  ? ?  ? ?Review of Systems ? ?Other than above, a 14 point review of systems is negative  ?  ? ?Past Medical History:  ?Diagnosis Date  ? Anemia   ? Blood transfusion without reported diagnosis   ? Cataract   ? Closed left ankle fracture 01/2019  ? no surgery  ? Eustachian tube dysfunction   ? Right side, Dr. Laurance Flatten, ENT  ? Microhematuria   ? s/p eval by urology 2008: U/S, CT, cysto w/ neg Bx (dx w/ a renal cyst, see reports)  ? Osteoarthritis   ? h/o back pain, sees ortho s/p shots  ? Osteoporosis   ? ? ?Past Surgical History:  ?Procedure Laterality Date  ? ABDOMINAL HYSTERECTOMY    ? no oophorectomy per pt  ? APPENDECTOMY    ? COLONOSCOPY    ? DILATION AND CURETTAGE OF UTERUS    ? TONSILLECTOMY    ? ?Social History  ? ?Socioeconomic History  ? Marital status: Widowed  ?  Spouse name: Not on file  ? Number of children: 0  ? Years of education: Not on file  ? Highest education level: Not on file  ?Occupational History  ? Occupation: retired  ?Tobacco Use  ? Smoking status: Former  ? Smokeless tobacco: Never  ?Substance and Sexual Activity  ? Alcohol use: Yes  ?  Comment: socially -wine  ? Drug use: No  ? Sexual activity: Not on file  ?Other Topics Concern  ? Not on file  ?Social History Narrative  ? Household -- lives by herself in a townhouse   ? Still drives   ? Emergency contact-- [name] (sister in law) lives in [place] --[phone]  ? She is her health POA  ? ?Social Determinants of Health  ? ?Financial Resource  Strain: Low Risk   ? Difficulty of Paying Living Expenses: Not hard at all  ?Food Insecurity: No Food Insecurity  ? Worried About Charity fundraiser in the Last Year: Never true  ? Ran Out of Food in the Last Year: Never true  ?Transportation Needs: No Transportation Needs  ? Lack of Transportation (Medical): No  ? Lack of Transportation (Non-Medical): No  ?Physical Activity: Insufficiently Active  ? Days of Exercise per Week: 4 days  ? Minutes of Exercise per Session: 20 min  ?Stress: No Stress Concern Present  ? Feeling of Stress : Only a little  ?Social Connections: Moderately Integrated  ? Frequency of Communication with Friends and Family: More than three times a week  ? Frequency of Social Gatherings with Friends and Family: More than three times a week  ? Attends Religious Services: More than 4 times per year  ? Active Member of Clubs or Organizations: Yes  ? Attends Archivist Meetings: More than 4 times per year  ? Marital Status: Widowed  ?Intimate Partner Violence: Not At Risk  ? Fear of Current or Ex-Partner: No  ?  Emotionally Abused: No  ? Physically Abused: No  ? Sexually Abused: No  ? ? ? ?Current Outpatient Medications  ?Medication Instructions  ? amLODipine (NORVASC) 2.5 MG tablet TAKE 1 TABLET EVERY DAY  ? Calcium Carbonate (CALTRATE 600 PO) 1 tablet, Oral, Daily,    ? cholecalciferol (VITAMIN D3) 1,000 Units, Oral, Daily  ? denosumab (PROLIA) 60 mg, Subcutaneous, Every 6 months, Administer in upper arm, thigh, or abdomen   ? Docusate Calcium (STOOL SOFTENER PO) 1 capsule, Oral, Daily  ? FIBER PO 2 tablets, Oral, Daily,    ? fluticasone (FLONASE) 50 MCG/ACT nasal spray 2 sprays, Each Nare, Daily PRN  ? Multiple Vitamins-Minerals (CENTRUM [place]) CHEW Oral,    ? multivitamin-lutein (OCUVITE-LUTEIN) CAPS capsule 1 capsule, Oral, Daily  ? pantoprazole (PROTONIX) 20 mg, Oral, Daily before breakfast  ? ? ?   ?Objective:  ? Physical Exam ?BP 132/82 (BP Location: Left Arm, Patient Position:  Sitting, Cuff Size: Small)   Pulse 72   Temp 98 ?F (36.7 ?C) (Oral)   Resp 16   Ht '5\' 1"'$  (1.549 m)   Wt 147 lb 6 oz (66.8 kg)   SpO2 98%   BMI 27.85 kg/m?  ?General: ?Well developed, slightly frail elderly female in no distress. ?Neck: No  thyromegaly  ?HEENT:  ?Normocephalic . Face symmetric, atraumatic ?Lungs:  ?Few rhonchi noted. ?Normal respiratory effort, no intercostal retractions, no accessory muscle use. ?Heart: RRR, + systolic murmur.  ?Abdomen:  ?Not distended, soft, non-tender. No rebound or rigidity.   ?Lower extremities: no pretibial edema bilaterally  ?Skin: Exposed areas without rash. Not pale. Not jaundice ?Neurologic:  ?alert & oriented X3.  ?Speech normal, gait appropriate for age and unassisted.  Did need help transferring to the table ?Strength symmetric and appropriate for age.  ?Psych: ?Cognition and judgment appear intact.  ?Cooperative with normal attention span and concentration.  ?Behavior appropriate. ?No anxious or depressed appearing. ? ?   ?Assessment   ? ?Assessment ?HTN ?Interstitial lung dz per chest x-ray 08-2014, PFTs 08-2014: minimal obstruction ?MSK: ?--DJD ?--Neck pain ?--Back pain: h/o acupuncture before ?Osteoporosis:  ?T score 04-2015 -3.1, prolia #2  11-2015  ?T score -3.4 (10/2016), saw Dr. Loanne Drilling, work-up for secondary etiologies negative, rx Prolia 03/2018 ?Microhematuria  s/p eval by urology 2008: U/S, CT, cysto w/ neg Bx (dx w/ a renal cyst, see reports) ?ET dysfuntion saw ENT Dr Laurance Flatten 2016 ?Anemia, saw GI: 06-2016: Cscope: polyps, tubular adenoma; EGD: cameron lesions, no Bx ?H/o Anxiety ?Murmur: Echo 09/2019: Mild to moderate MR and AS ? ? ?PLAN  ?Here for CPX ?HTN: Ambulatory BPs in the 140s, very rarely goes higher or lower.  Were checking labs, continue amlodipine. ?Interstitial lung disease: ?Few rhonchi on exam, no DOE, no cough.stable. ?Osteoporosis: On Prolia, no history of vitamin D deficiency, check DEXA. ?Heart murmur: Mild AS, recheck a echo on RTC.  No  symptoms. ?Abnormal CBC: Last few CBCs show increased monocytes, check a blood smear. ?Weight loss: Mild, see HPI, reassess on RTC.  Checking labs ?RTC 6 months ? ?  ? ?  ?This visit occurred during the SARS-CoV-2 public health emergency.  Safety protocols were in place, including screening questions prior to the visit, additional usage of staff PPE, and extensive cleaning of exam room while observing appropriate contact time as indicated for disinfecting solutions.  ? ?

## 2021-09-08 NOTE — Patient Instructions (Signed)
Check the  blood pressure regularly ?BP GOAL is between 110/65 and  135/85. ?If it is consistently higher or lower, let me know ? ?Please consider getting a tetanus shot booster at your pharmacy ? ? ?[facility] : Get the blood work   ? ? ?[place] ?Come back for a checkup in 6 months ? ? ?Stop by the first floor schedule a bone density test ? ? ?Fall Prevention in the Home, Adult ?Falls can cause injuries and affect people of all ages. There are many simple things that you can do to make your home safe and to help prevent falls. Ask for help when making these changes, if needed. ?What actions can I take to prevent falls? ?General instructions ?Use good lighting in all rooms. Replace any light bulbs that burn out, turn on lights if it is dark, and use night-lights. ?Place frequently used items in easy-to-reach places. Lower the shelves around your home if necessary. ?Set up furniture so that there are clear paths around it. Avoid moving your furniture around. ?Remove throw rugs and other tripping hazards from the floor. ?Avoid walking on wet floors. ?Fix any uneven floor surfaces. ?Add color or contrast paint or tape to grab bars and handrails in your home. Place contrasting color strips on the first and last steps of staircases. ?When you use a stepladder, make sure that it is completely opened and that the sides and supports are firmly locked. Have someone hold the ladder while you are using it. Do not climb a closed stepladder. ?Know where your pets are when moving through your home. ?What can I do in the bathroom? ?  ?Keep the floor dry. Immediately clean up any water that is on the floor. ?Remove soap buildup in the tub or shower regularly. ?Use nonskid mats or decals on the floor of the tub or shower. ?Attach bath mats securely with double-sided, nonslip rug tape. ?If you need to sit down while you are in the shower, use a plastic, nonslip stool. ?Install grab  bars by the toilet and in the tub and shower. Do not use towel bars as grab bars. ?What can I do in the bedroom? ?Make sure that a bedside light is easy to reach. ?Do not use oversized bedding that reaches the floor. ?Have a firm chair that has side arms to use for getting dressed. ?What can I do in the kitchen? ?Clean up any spills right away. ?If you need to reach for something above you, use a sturdy step stool that has a grab bar. ?Keep electrical cables out of the way. ?Do not use floor polish or wax that makes floors slippery. If you must use wax, make sure that it is non-skid floor wax. ?What can I do with my stairs? ?Do not leave any items on the stairs. ?Make sure that you have a light switch at the top and the bottom of the stairs. Have them installed if you do not have them. ?Make sure that there are handrails on both sides of the stairs. Fix handrails that are broken or loose. Make sure that handrails are as long as the staircases. ?Install non-slip stair treads on all stairs in your home. ?Avoid having throw rugs at the top or bottom of stairs, or secure the rugs with carpet tape to prevent them from moving. ?Choose a carpet design that does not hide the edge of steps on the stairs. ?Check any carpeting to make  sure that it is firmly attached to the stairs. Fix any carpet that is loose or worn. ?What can I do on the outside of my home? ?Use bright outdoor lighting. ?Regularly repair the edges of walkways and driveways and fix any cracks. ?Remove high doorway thresholds. ?Trim any shrubbery on the main path into your home. ?Regularly check that handrails are securely fastened and in good repair. Both sides of all steps should have handrails. ?Install guardrails along the edges of any raised decks or porches. ?Clear walkways of debris and clutter, including tools and rocks. ?Have leaves, snow, and ice cleared regularly. ?Use sand or salt on walkways during winter months. ?In the garage, clean up any  spills right away, including grease or oil spills. ?What other actions can I take? ?Wear closed-toe shoes that fit well and support your feet. Wear shoes that have rubber soles or low heels. ?Use mobility aids as needed, such as canes, walkers, scooters, and crutches. ?Review your medicines with your health care provider. Some medicines can cause dizziness or changes in blood pressure, which increase your risk of falling. ?Talk with your health care provider about other ways that you can decrease your risk of falls. This may include working with a physical therapist or trainer to improve your strength, balance, and endurance. ?Where to find more information ?Centers for Disease Control and Prevention, STEADI: http://www.wolf.info/ ?[employer] on Aging: http://kim-miller.com/ ?Contact a health care provider if: ?You are afraid of falling at home. ?You feel weak, drowsy, or dizzy at home. ?You fall at home. ?Summary ?There are many simple things that you can do to make your home safe and to help prevent falls. ?Ways to make your home safe include removing tripping hazards and installing grab bars in the bathroom. ?Ask for help when making these changes in your home. ?This information is not intended to replace advice given to you by your health care provider. Make sure you discuss any questions you have with your health care provider. ?Document Revised: 01/09/2020 Document Reviewed: 01/09/2020 ?Elsevier Patient Education ? [place]. ? ?

## 2021-09-09 LAB — CBC WITH DIFFERENTIAL/PLATELET
Absolute Monocytes: 627 cells/uL (ref 200–950)
Basophils Absolute: 29 cells/uL (ref 0–200)
Basophils Relative: 0.5 %
Eosinophils Absolute: 108 cells/uL (ref 15–500)
Eosinophils Relative: 1.9 %
HCT: 41.8 % (ref 35.0–45.0)
Hemoglobin: 13.9 g/dL (ref 11.7–15.5)
Lymphs Abs: 1186 cells/uL (ref 850–3900)
MCH: 31.4 pg (ref 27.0–33.0)
MCHC: 33.3 g/dL (ref 32.0–36.0)
MCV: 94.6 fL (ref 80.0–100.0)
MPV: 11.7 fL (ref 7.5–12.5)
Monocytes Relative: 11 %
Neutro Abs: 3751 cells/uL (ref 1500–7800)
Neutrophils Relative %: 65.8 %
Platelets: 210 10*3/uL (ref 140–400)
RBC: 4.42 10*6/uL (ref 3.80–5.10)
RDW: 12.5 % (ref 11.0–15.0)
Total Lymphocyte: 20.8 %
WBC: 5.7 10*3/uL (ref 3.8–10.8)

## 2021-09-09 LAB — COMPREHENSIVE METABOLIC PANEL
AG Ratio: 1.5 (calc) (ref 1.0–2.5)
ALT: 13 U/L (ref 6–29)
AST: 19 U/L (ref 10–35)
Albumin: 3.9 g/dL (ref 3.6–5.1)
Alkaline phosphatase (APISO): 63 U/L (ref 37–153)
BUN: 19 mg/dL (ref 7–25)
CO2: 23 mmol/L (ref 20–32)
Calcium: 9.4 mg/dL (ref 8.6–10.4)
Chloride: 105 mmol/L (ref 98–110)
Creat: 0.75 mg/dL (ref 0.60–0.95)
Globulin: 2.6 g/dL (calc) (ref 1.9–3.7)
Glucose, Bld: 114 mg/dL — ABNORMAL HIGH (ref 65–99)
Potassium: 4.4 mmol/L (ref 3.5–5.3)
Sodium: 144 mmol/L (ref 135–146)
Total Bilirubin: 0.3 mg/dL (ref 0.2–1.2)
Total Protein: 6.5 g/dL (ref 6.1–8.1)

## 2021-09-09 LAB — PATHOLOGIST SMEAR REVIEW

## 2021-09-09 LAB — TSH: TSH: 3.36 mIU/L (ref 0.40–4.50)

## 2021-09-10 ENCOUNTER — Ambulatory Visit (HOSPITAL_BASED_OUTPATIENT_CLINIC_OR_DEPARTMENT_OTHER)
Admission: RE | Admit: 2021-09-10 | Discharge: 2021-09-10 | Disposition: A | Discharge status: Home / Self Care | Payer: Medicare HMO | Source: Ambulatory Visit | Attending: Internal Medicine | Admitting: Internal Medicine

## 2021-09-10 ENCOUNTER — Other Ambulatory Visit: Payer: Self-pay

## 2021-09-10 DIAGNOSIS — Z780 Asymptomatic menopausal state: Secondary | ICD-10-CM | POA: Diagnosis not present

## 2021-09-10 DIAGNOSIS — M810 Age-related osteoporosis without current pathological fracture: Secondary | ICD-10-CM | POA: Diagnosis not present

## 2021-09-18 DIAGNOSIS — H353231 Exudative age-related macular degeneration, bilateral, with active choroidal neovascularization: Secondary | ICD-10-CM | POA: Diagnosis not present

## 2021-09-23 DIAGNOSIS — B351 Tinea unguium: Secondary | ICD-10-CM | POA: Diagnosis not present

## 2021-09-23 DIAGNOSIS — M79671 Pain in right foot: Secondary | ICD-10-CM | POA: Diagnosis not present

## 2021-09-23 DIAGNOSIS — M79672 Pain in left foot: Secondary | ICD-10-CM | POA: Diagnosis not present

## 2021-10-24 DIAGNOSIS — Z961 Presence of intraocular lens: Secondary | ICD-10-CM | POA: Diagnosis not present

## 2021-10-24 DIAGNOSIS — H52223 Regular astigmatism, bilateral: Secondary | ICD-10-CM | POA: Diagnosis not present

## 2021-10-24 DIAGNOSIS — H524 Presbyopia: Secondary | ICD-10-CM | POA: Diagnosis not present

## 2021-10-27 DIAGNOSIS — H35721 Serous detachment of retinal pigment epithelium, right eye: Secondary | ICD-10-CM | POA: Diagnosis not present

## 2021-10-27 DIAGNOSIS — H3581 Retinal edema: Secondary | ICD-10-CM | POA: Diagnosis not present

## 2021-10-27 DIAGNOSIS — H353231 Exudative age-related macular degeneration, bilateral, with active choroidal neovascularization: Secondary | ICD-10-CM | POA: Diagnosis not present

## 2021-11-21 NOTE — Telephone Encounter (Signed)
Prolia VOB initiated via [email or web address]  Last OV:  Next OV:  Last PROLIA inj 06/25/21 Next PROLIA inj due 12/24/21

## 2021-11-24 ENCOUNTER — Ambulatory Visit (INDEPENDENT_AMBULATORY_CARE_PROVIDER_SITE_OTHER): Payer: Medicare HMO | Admitting: Pharmacist

## 2021-11-24 DIAGNOSIS — M810 Age-related osteoporosis without current pathological fracture: Secondary | ICD-10-CM

## 2021-11-24 DIAGNOSIS — I10 Essential (primary) hypertension: Secondary | ICD-10-CM

## 2021-11-25 NOTE — Patient Instructions (Signed)
Mrs. Gainey It was a pleasure speaking with you today.   Patient Goals/Self-Care Activities Over the next 180 days, patient will:  take medications as prescribed,  check blood pressure every other day , document, and provide at future appointments continue to walk daily Practice fall prevention   If you have any questions or concerns, please feel free to contact me either at the phone number below or with a MyChart message.   Keep up the good work!  Cherre Robins, PharmD Clinical Pharmacist [place] [phone] (direct line)  [phone] (main office number)   Patient verbalizes understanding of instructions and care plan provided today and agrees to view in [place]. Active MyChart status and patient understanding of how to access instructions and care plan via MyChart confirmed with patient.

## 2021-11-25 NOTE — Chronic Care Management (AMB) (Signed)
Chronic Care Management Pharmacy Note  11/25/2021 Name:  Kimberly Rodriguez MRN:  [id number] DOB:  07-15-1926  Summary:  Home blood pressure continues to be a little high; last blood pressure in office was at goal. At last Chronic Care Management in person visit her home blood pressure monitor was measuring a little higher then office blood pressure monitor. Recommended she use [employer] over-the-counter benefits to purchase new blood pressure cuff.  Continue to check blood pressure daily and take amlodipine 2.79m daily She is due Prolia 12/24/2021 - See phone note from 11/21/2021 - Kimberly Rodriguez submitted VOB; administration appointment will be made once VOB completed.    Subjective: Kimberly MCCLARANis an 88y.o. year old female who is a primary patient of Paz, JAlda Berthold MD.  The CCM team was consulted for assistance with disease management and care coordination needs.    Engaged with patient by telephone for follow up visit in response to provider referral for pharmacy case management and/or care coordination services.   Consent to Services:  The patient was given information about Chronic Care Management services, agreed to services, and gave verbal consent prior to initiation of services.  Please see initial visit note for detailed documentation.   Patient Care Team: PColon Branch MD as PCP - General TRoel Cluck MD as Referring Physician (Ophthalmology) MLaurance FlattenDVelora Heckler, MD (Family Medicine) ECherre Robins RPH-CPP (Pharmacist)  Recent office visits: 06/05/2021 - Int Med (Dr PLarose Kells Follow up chronic conditions. No med changes noted.  12/04/2020 - PCP (DR PLarose Kells  Routine f/u; received Prolia injection and dose #4 of CVOID vaccine / 2nd Booster.    Recent consult visits: 06/05/2021-  Ophthalmology (Dr SManuella Ghazi Seen for macular degeneration both eyes. Continue AREDS vitamins. Received Eylea injection left eye 04/28/2021 - Ophthalmology (Dr SManuella Ghazi Seen for macular degeneration both  eyes. Continue AREDS vitamins. Received Eylea injection left eye 03/31/2021 - Received Eylea injection left eye for AMD  03/03/21 Ophthalmology (Dr SManuella Ghazi Seen macular degeneration of both eyes. Received aflibercept (EYLEA) intravitreal injection in right eye. Eye exam completed. Follow up 03/31/21. 01/05/21 Opthamology (Dr SManuella Ghazi Initial consult for macular degeneration of both eyes. Pt will take part in a clinical trial for treatment [facility] (around 02/02/2021) for IVE rigth eye  Hospital visits: None in previous 6 months  Objective:  Lab Results  Component Value Date   CREATININE 0.75 09/08/2021   CREATININE 0.69 09/03/2020   CREATININE 0.70 05/15/2019    Lab Results  Component Value Date   HGBA1C 5.5 10/28/2006   Last diabetic Eye exam: No results found for: HMDIABEYEEXA  Last diabetic Foot exam: No results found for: HMDIABFOOTEX      Component Value Date/Time   CHOL 170 09/03/2020 1055   TRIG 65.0 09/03/2020 1055   HDL 69.90 09/03/2020 1055   CHOLHDL 2 09/03/2020 1055   VLDL 13.0 09/03/2020 1055   LDLCALC 87 09/03/2020 1055   LDLDIRECT 125.0 08/14/2010 1105       Latest Ref Rng & Units 09/08/2021    1:34 PM 09/03/2020   10:55 AM 05/15/2019   10:31 AM  Hepatic Function  Total Protein 6.1 - 8.1 g/dL 6.5   6.3   6.2    Albumin 3.5 - 5.2 g/dL  3.8   3.7    AST 10 - 35 U/L '19   18   18    ' ALT 6 - 29 U/L  '13   15   14    ' Alk Phosphatase 39 - 117 U/L  74   78    Total Bilirubin 0.2 - 1.2 mg/dL 0.3   0.4   0.5      Lab Results  Component Value Date/Time   TSH 3.36 09/08/2021 01:34 PM   TSH 2.53 09/18/2018 02:28 PM       Latest Ref Rng & Units 09/08/2021    1:34 PM 09/03/2020   10:55 AM 05/15/2019   10:31 AM  CBC  WBC 3.8 - 10.8 Thousand/uL 5.7   8.5   6.6    Hemoglobin 11.7 - 15.5 g/dL 13.9   13.5   13.2    Hematocrit 35.0 - 45.0 % 41.8   40.8   39.9    Platelets 140 - 400 Thousand/uL 210   225.0   193.0       Lab Results  Component Value Date/Time   VD25OH 54.15 07/12/2017 02:11 PM   VD25OH 57 12/11/2010 08:42 AM   VD25OH 47 08/14/2010 08:20 PM    Clinical ASCVD: No  The ASCVD Risk score (Arnett DK, et al., 2019) failed to calculate for the following reasons:   The 2019 ASCVD risk score is only valid for ages 62 to 69     Social History   Tobacco Use  Smoking Status Former  Smokeless Tobacco Never   BP Readings from Last 3 Encounters:  09/08/21 132/82  07/27/21 (!) 142/62  06/05/21 134/76   Pulse Readings from Last 3 Encounters:  09/08/21 72  07/27/21 (!) 55  06/05/21 84   Wt Readings from Last 3 Encounters:  09/08/21 147 lb 6 oz (66.8 kg)  07/27/21 150 lb 8 oz (68.3 kg)  06/05/21 152 lb (68.9 kg)    Assessment: Review of patient past medical history, allergies, medications, health status, including review of consultants reports, laboratory and other test data, was performed as part of comprehensive evaluation and provision of chronic care management services.   SDOH:  (Social Determinants of Health) assessments and interventions performed:     CCM Care Plan  Allergies  Allergen Reactions   Astelin [Azelastine Hcl] Palpitations    Medications Reviewed Today     Reviewed by Cherre Robins, RPH-CPP (Pharmacist) on 11/25/21 at 0554  Med List Status: <None>   Medication Order Taking? Sig Documenting Provider Last Dose Status Informant  amLODipine (NORVASC) 2.5 MG tablet [id number] Yes TAKE 1 TABLET EVERY DAY Paz, Alda Berthold, MD Taking Active   Calcium Carbonate (CALTRATE 600 PO) [id number] Yes Take 1 tablet by mouth daily. [provider] Taking Active   cholecalciferol (VITAMIN D3) 25 MCG (1000 UNIT) tablet [id number] Yes Take 1,000 Units by mouth daily. [provider] Taking Active   denosumab (PROLIA) 60 MG/ML SOLN injection [id number] Yes Inject 60 mg into the skin every 6 (six) months. Administer in upper arm, thigh, or abdomen [provider] Taking Active            Med Note (CANTER, Lindajo Royal Dec 04, 2020 10:13 AM)    Docusate Calcium (STOOL SOFTENER PO) [id number] Yes Take 1 capsule by mouth daily. [provider] Taking Active   FIBER PO [id number] Yes Take 2 tablets by mouth daily. [provider] Taking Active   fluticasone (FLONASE) 50 MCG/ACT nasal spray [id number] Yes Place 2 sprays into both nostrils daily as needed. Colon Branch, MD Taking Active  Med Note Collie Siad Dec 04, 2020 10:13 AM)    Multiple Vitamins-Minerals (CENTRUM [place]) CHEW [id number] Yes Chew by mouth. [provider] Taking Active   multivitamin-lutein [facility]) CAPS capsule [id number] Yes Take 1 capsule by mouth daily. [provider] Taking Active   pantoprazole (PROTONIX) 20 MG tablet [id number] Yes TAKE 1 TABLET (20 MG TOTAL) BY MOUTH DAILY BEFORE BREAKFAST. Colon Branch, MD Taking Active             Patient Active Problem List   Diagnosis Date Noted   Exudative age-related macular degeneration of both eyes with active choroidal neovascularization ([place]) 01/01/2021   Hypertension, DX 08-2020 09/04/2020   Closed nondisplaced fracture of lateral malleolus of left fibula 01/23/2019   Closed left ankle fracture 01/2019   PCP NOTES >>>>>>>>>>>>>>>>>>>>> 04/01/2015   Lung disease--diffuse interstitial lung disease by x-ray 08-2014 09/05/2014   Anxiety 02/29/2012   Annual physical exam 12/11/2010   DJD (degenerative joint disease) 10/24/2009   Osteoporosis 06/05/2009    Immunization History  Administered Date(s) Administered   Fluad Quad(high Dose 65+) 03/16/2019, 06/05/2021   Influenza Split 02/29/2012   Influenza Whole 04/17/2008   Influenza, High Dose Seasonal PF 04/01/2015, 04/29/2016, 05/04/2017, 05/10/2018   Influenza,inj,Quad PF,6+ Mos 03/27/2014   Influenza-Unspecified 03/17/2020, 04/18/2020   PFIZER Comirnaty(Gray Top)Covid-19 Tri-Sucrose Vaccine 12/04/2020    PFIZER(Purple Top)SARS-COV-2 Vaccination 06/30/2019, 07/21/2019, 03/17/2020   Pneumococcal Conjugate-13 03/27/2014   Pneumococcal Polysaccharide-23 09/30/2004, 05/10/2018   Td 03/03/2001   Tdap 12/11/2010   Zoster Recombinat (Shingrix) 07/18/2018, 10/13/2020   Zoster, Live 11/29/2008    Conditions to be addressed/monitored: HTN and GERD, osteoporosis; DJD  Care Plan : General Pharmacy (Adult)  Updates made by Cherre Robins, RPH-CPP since 11/25/2021 12:00 AM     Problem: osteoporosis; HTN; HDL; GERD   Priority: Medium     Long-Range Goal: pharmacy goals for chronic conditions and medication management   Priority: Medium  Note:   Cur; rent Barriers:  Unable to maintain control of HTN   Pharmacist Clinical Goal(s):  Over the next 180 days, patient will achieve adherence to monitoring guidelines and medication adherence to achieve therapeutic efficacy achieve control of HTN as evidenced by BP <140/90  through collaboration with PharmD and provider.   Interventions: 1:1 collaboration with Colon Branch, MD regarding development and update of comprehensive plan of care as evidenced by provider attestation and co-signature Inter-disciplinary care team collaboration (see longitudinal plan of care) Comprehensive medication review performed; medication list updated in electronic medical record  Hypertension:  Variable BP in office and at home;  BP goal <140/90 Checking BP daily at home: 135/75;154/80; 139/81; 145/78; 143/75; 157/72; 152/86; 145/72; 131/64; 134/82; 148;74; 141/79; 149/65 Recent readings:  Average SBP = 144 Average DBP = 75.6 Heart rate range 56 to 72  Current regimen:  Amlodipine 2.16m daily  Interventions: Recommended to continue to check blood pressure every other day, document, and provide at future appointments.  Blood pressure was slightly elevated with her monitor versus office monitor in past. Suggested purchasing a new blood pressure monitor. Ensure daily salt  intake < 2300 mg/day Continue current medication for blood pressure  Osteoporosis Goal: reduce risk of fracture due to osteoporosis Dexa Results:  Forearm T-Score = -3.7 (09/10/2021) was -3.4 in 2018 and -3.3 in 2016 and -3.2 in 2012 Total meal femur T-Score = -1.3 (09/10/2021) and -1.7 (2018) Current regimen:  Prolia 655mmL every 6 months Calcium carbonate 60056maily Multivitamin - 1 tablet daily Vitamin  D 1000 units daily Adequate calcium intake with supplementation and daily yogurt intake Interventions: Fall Risk assessment Continue current therapy - Next Prolia due July 2023. Follow fall prevention tips  GERD:  Goal: decrease symptoms for reflux Patient denies recent episodes of reflux Current therapy:  Pantoprazole 48m daily Interventions:  None - continue current therapy   Medication [facility] Interventions Comprehensive medication review performed. Continue current medication management strategy Focus on medication adherence by filling medications appropriately  Discussed [facility]-the-counter benefits [facility] Mailed patient catalog and information on how to order over-the-counter items. (Completed at previous visit)  Take medications as prescribed Report any questions or concerns to PharmD and/or provider(s)  Patient Goals/Self-Care Activities Over the next 180 days, patient will:  take medications as prescribed,  check blood pressure every other day , document, and provide at future appointments continue to walk daily Practice fall prevention   Follow Up Plan: No further follow up required: Medication related goal met. Patient will continue to follow up with PCP.         Medication Assistance: None required.  Patient affirms current coverage meets needs.  Patient's preferred pharmacy [facility]:  [id number] [place] [address] [address]Phone: [phone]: [phone] [facility] [place] [address]Phone: [phone]: [phone]  Follow Up:  Patient agrees to Care Plan and Follow-up.  Plan: No further follow up required: patient will continue to follow up with PCP.   TCherre Robins PharmD Clinical Pharmacist [phone]

## 2021-12-01 DIAGNOSIS — H353231 Exudative age-related macular degeneration, bilateral, with active choroidal neovascularization: Secondary | ICD-10-CM | POA: Diagnosis not present

## 2021-12-10 ENCOUNTER — Other Ambulatory Visit: Payer: Self-pay | Admitting: Internal Medicine

## 2021-12-18 DIAGNOSIS — M810 Age-related osteoporosis without current pathological fracture: Secondary | ICD-10-CM

## 2021-12-18 DIAGNOSIS — I10 Essential (primary) hypertension: Secondary | ICD-10-CM

## 2021-12-23 DIAGNOSIS — M79671 Pain in right foot: Secondary | ICD-10-CM | POA: Diagnosis not present

## 2021-12-23 DIAGNOSIS — M79672 Pain in left foot: Secondary | ICD-10-CM | POA: Diagnosis not present

## 2021-12-23 DIAGNOSIS — B351 Tinea unguium: Secondary | ICD-10-CM | POA: Diagnosis not present

## 2021-12-27 NOTE — Telephone Encounter (Signed)
Pt ready for scheduling on or after 7/6//23   Out-of-pocket cost due at time of visit: $301   Primary: [employer] Prolia co-insurance: 20% (approximately $276) Admin fee co-insurance: 20% (approximately $25)   Secondary: n/a Prolia co-insurance:  Admin fee co-insurance:  Des not apply Deductible:    Prior Auth: APPROVED PA# [id number] Valid: 06/21/21-06/20/22   ** This summary of benefits is an estimation of the patient's out-of-pocket cost. Exact cost may vary based on individual plan coverage.

## 2021-12-29 DIAGNOSIS — H353231 Exudative age-related macular degeneration, bilateral, with active choroidal neovascularization: Secondary | ICD-10-CM | POA: Diagnosis not present

## 2022-01-15 NOTE — Telephone Encounter (Signed)
Left vm to return call.    

## 2022-01-19 ENCOUNTER — Ambulatory Visit (INDEPENDENT_AMBULATORY_CARE_PROVIDER_SITE_OTHER): Payer: Medicare HMO

## 2022-01-19 DIAGNOSIS — M810 Age-related osteoporosis without current pathological fracture: Secondary | ICD-10-CM | POA: Diagnosis not present

## 2022-01-19 MED ORDER — DENOSUMAB 60 MG/ML ~~LOC~~ SOSY
60.0000 mg | PREFILLED_SYRINGE | Freq: Once | SUBCUTANEOUS | Status: AC
  Administered 2022-01-19: 60 mg via SUBCUTANEOUS

## 2022-01-19 NOTE — Progress Notes (Signed)
Kimberly Rodriguez is a 88 y.o. female presents to the office today for prolia injections, per physician's orders.  Prolia (med), 60 mg/ml (dose), subcutaneous (route) was administered left arm (location) today. Patient tolerated injection. Patient due for follow up labs/provider appt: Yes.

## 2022-01-29 NOTE — Telephone Encounter (Signed)
Last Prolia inj 01/19/22 Next Prolia inj due 07/23/22

## 2022-02-11 ENCOUNTER — Other Ambulatory Visit (HOSPITAL_BASED_OUTPATIENT_CLINIC_OR_DEPARTMENT_OTHER): Payer: Self-pay | Admitting: Internal Medicine

## 2022-02-11 DIAGNOSIS — Z1231 Encounter for screening mammogram for malignant neoplasm of breast: Secondary | ICD-10-CM

## 2022-02-15 ENCOUNTER — Encounter (HOSPITAL_BASED_OUTPATIENT_CLINIC_OR_DEPARTMENT_OTHER): Payer: Self-pay

## 2022-02-15 ENCOUNTER — Ambulatory Visit (HOSPITAL_BASED_OUTPATIENT_CLINIC_OR_DEPARTMENT_OTHER)
Admission: RE | Admit: 2022-02-15 | Discharge: 2022-02-15 | Disposition: A | Discharge status: Home / Self Care | Payer: Medicare HMO | Source: Ambulatory Visit | Attending: Internal Medicine | Admitting: Internal Medicine

## 2022-02-15 DIAGNOSIS — Z1231 Encounter for screening mammogram for malignant neoplasm of breast: Secondary | ICD-10-CM | POA: Insufficient documentation

## 2022-02-23 DIAGNOSIS — H353231 Exudative age-related macular degeneration, bilateral, with active choroidal neovascularization: Secondary | ICD-10-CM | POA: Diagnosis not present

## 2022-02-23 DIAGNOSIS — H3581 Retinal edema: Secondary | ICD-10-CM | POA: Diagnosis not present

## 2022-02-23 DIAGNOSIS — H35721 Serous detachment of retinal pigment epithelium, right eye: Secondary | ICD-10-CM | POA: Diagnosis not present

## 2022-02-27 ENCOUNTER — Other Ambulatory Visit: Payer: Self-pay | Admitting: Internal Medicine

## 2022-03-11 ENCOUNTER — Other Ambulatory Visit (INDEPENDENT_AMBULATORY_CARE_PROVIDER_SITE_OTHER): Payer: Medicare HMO

## 2022-03-11 ENCOUNTER — Other Ambulatory Visit (HOSPITAL_BASED_OUTPATIENT_CLINIC_OR_DEPARTMENT_OTHER): Payer: Self-pay

## 2022-03-11 ENCOUNTER — Encounter: Payer: Self-pay | Admitting: Internal Medicine

## 2022-03-11 ENCOUNTER — Ambulatory Visit (INDEPENDENT_AMBULATORY_CARE_PROVIDER_SITE_OTHER): Payer: Medicare HMO | Admitting: Internal Medicine

## 2022-03-11 VITALS — BP 138/84 | HR 76 | Temp 98.0°F | Resp 16 | Ht 61.0 in | Wt 145.0 lb

## 2022-03-11 DIAGNOSIS — I350 Nonrheumatic aortic (valve) stenosis: Secondary | ICD-10-CM

## 2022-03-11 DIAGNOSIS — R739 Hyperglycemia, unspecified: Secondary | ICD-10-CM

## 2022-03-11 DIAGNOSIS — I10 Essential (primary) hypertension: Secondary | ICD-10-CM | POA: Diagnosis not present

## 2022-03-11 DIAGNOSIS — Z23 Encounter for immunization: Secondary | ICD-10-CM | POA: Diagnosis not present

## 2022-03-11 DIAGNOSIS — I340 Nonrheumatic mitral (valve) insufficiency: Secondary | ICD-10-CM

## 2022-03-11 LAB — BASIC METABOLIC PANEL
BUN: 21 mg/dL (ref 6–23)
CO2: 29 mEq/L (ref 19–32)
Calcium: 9.1 mg/dL (ref 8.4–10.5)
Chloride: 105 mEq/L (ref 96–112)
Creatinine, Ser: 0.73 mg/dL (ref 0.40–1.20)
GFR: 71.08 mL/min (ref >60.00)
Glucose, Bld: 85 mg/dL (ref 70–99)
Potassium: 4.3 mEq/L (ref 3.5–5.1)
Sodium: 142 mEq/L (ref 135–145)

## 2022-03-11 MED ORDER — AREXVY 120 MCG/0.5ML IM SUSR
INTRAMUSCULAR | 0 refills | Status: DC
  Filled 2022-03-11: qty 1, 1d supply, fill #0

## 2022-03-11 NOTE — Patient Instructions (Addendum)
Vaccines I recommend:  Covid booster RSV vaccine   We will schedule a echocardiogram   Check the  blood pressure regularly BP GOAL is between 110/65 and  145/85. If it is consistently higher or lower, let me know    GO TO THE LAB : Get the blood work     [place], Todd back for   a physical exam by 08-2022

## 2022-03-11 NOTE — Progress Notes (Signed)
Subjective:    Patient ID: Kimberly Rodriguez, female    DOB: Sep 19, 1926, 88 y.o.   MRN: [id number]  DOS:  03/11/2022 Type of visit - description: Follow-up  Since the last office visit she is doing well. We review her ambulatory BPs together. She is able to do all her ADLs without problems.  Specifically no shortness of breath, palpitations, chest pain. Lower extremity edema controlled with compression stockings  Review of Systems See above   Past Medical History:  Diagnosis Date   Anemia    Blood transfusion without reported diagnosis    Cataract    Closed left ankle fracture 01/2019   no surgery   Eustachian tube dysfunction    Right side, Dr. Laurance Flatten, ENT   Microhematuria    s/p eval by urology 2008: U/S, CT, cysto w/ neg Bx (dx w/ a renal cyst, see reports)   Osteoarthritis    h/o back pain, sees ortho s/p shots   Osteoporosis     Past Surgical History:  Procedure Laterality Date   ABDOMINAL HYSTERECTOMY     no oophorectomy per pt   APPENDECTOMY     COLONOSCOPY     DILATION AND CURETTAGE OF UTERUS     TONSILLECTOMY      Current Outpatient Medications  Medication Instructions   amLODipine (NORVASC) 2.5 mg, Oral, Daily   Calcium Carbonate (CALTRATE 600 PO) 1 tablet, Oral, Daily,     cholecalciferol (VITAMIN D3) 1,000 Units, Oral, Daily   denosumab (PROLIA) 60 mg, Subcutaneous, Every 6 months, Administer in upper arm, thigh, or abdomen    Docusate Calcium (STOOL SOFTENER PO) 1 capsule, Oral, Daily   FIBER PO 2 tablets, Oral, Daily,     fluticasone (FLONASE) 50 MCG/ACT nasal spray 2 sprays, Each Nare, Daily PRN   Multiple Vitamins-Minerals (CENTRUM [place]) CHEW Oral,     multivitamin-lutein (OCUVITE-LUTEIN) CAPS capsule 1 capsule, Oral, Daily   pantoprazole (PROTONIX) 20 mg, Oral, Daily before breakfast   RSV vaccine recomb adjuvanted (AREXVY) 120 MCG/0.5ML injection Intramuscular       Objective:   Physical Exam BP 138/84   Pulse 76   Temp 98 F (36.7 C)  (Oral)   Resp 16   Ht '5\' 1"'$  (1.549 m)   Wt 145 lb (65.8 kg)   SpO2 96%   BMI 27.40 kg/m  General:   Well developed, NAD, BMI noted. HEENT:  Normocephalic . Face symmetric, atraumatic Lungs:  CTA B Normal respiratory effort, no intercostal retractions, no accessory muscle use. Heart: RRR, + systolic murmur.  Slightly more noticeable than before? Lower extremities: no pretibial edema bilaterally  Skin: Not pale. Not jaundice Neurologic:  alert & oriented X3.  Speech normal, gait appropriate for age and unassisted Psych--  Cognition and judgment appear intact.  Cooperative with normal attention span and concentration.  Behavior appropriate. No anxious or depressed appearing.      Assessment    Assessment HTN Interstitial lung dz per chest x-ray 08-2014, PFTs 08-2014: minimal obstruction MSK: --DJD --Neck pain --Back pain: h/o acupuncture before Osteoporosis:  T score 04-2015 -3.1, prolia #2  11-2015  T score -3.4 (10/2016), saw Dr. Loanne Drilling, work-up for secondary etiologies negative, rx Prolia 03/2018 Microhematuria  s/p eval by urology 2008: U/S, CT, cysto w/ neg Bx (dx w/ a renal cyst, see reports) ET dysfuntion saw ENT Dr Laurance Flatten 2016 Anemia, saw GI: 06-2016: Cscope: polyps, tubular adenoma; EGD: cameron lesions, no Bx H/o Anxiety Murmur: Echo 09/2019: Mild to moderate MR and AS  PLAN   HTN on amlodipine, ambulatory BPs in the morning range from 137/77 and no more than 158/78.  Most of her readings are acceptable.  Plan: Check BMP, continue same meds. Hyperglycemia: Mild, per chart review, check A1c Osteoporosis: Next Prolia 07-2022.  On calcium and vitamin D Heart murmur: Last echo 09-2019: MR, AAS, no symptoms, murmur slightly more noticeable today?.  Repeat echo. Flu shot today, vaccine advice: See AVS RTC 08-2022 for CPX

## 2022-03-12 NOTE — Assessment & Plan Note (Signed)
HTN on amlodipine, ambulatory BPs in the morning range from 137/77 and no more than 158/78.  Most of her readings are acceptable.  Plan: Check BMP, continue same meds. Hyperglycemia: Mild, per chart review, check A1c Osteoporosis: Next Prolia 07-2022.  On calcium and vitamin D Heart murmur: Last echo 09-2019: MR, AAS, no symptoms, murmur slightly more noticeable today?.  Repeat echo. Flu shot today, vaccine advice: See AVS RTC 08-2022 for CPX

## 2022-03-15 LAB — HEMOGLOBIN A1C: Hgb A1c MFr Bld: 5.8 % (ref 4.6–6.5)

## 2022-03-18 ENCOUNTER — Ambulatory Visit (HOSPITAL_COMMUNITY): Payer: Medicare HMO

## 2022-03-22 ENCOUNTER — Ambulatory Visit (HOSPITAL_COMMUNITY)
Admission: RE | Admit: 2022-03-22 | Discharge: 2022-03-22 | Disposition: A | Discharge status: Home / Self Care | Payer: Medicare HMO | Source: Ambulatory Visit | Attending: Internal Medicine | Admitting: Internal Medicine

## 2022-03-22 DIAGNOSIS — I081 Rheumatic disorders of both mitral and tricuspid valves: Secondary | ICD-10-CM | POA: Insufficient documentation

## 2022-03-22 DIAGNOSIS — I350 Nonrheumatic aortic (valve) stenosis: Secondary | ICD-10-CM

## 2022-03-22 LAB — ECHOCARDIOGRAM COMPLETE
AR max vel: 1.12 cm2
AV Area VTI: 1.2 cm2
AV Area mean vel: 1.18 cm2
AV Mean grad: 14 mmHg
AV Peak grad: 26 mmHg
Ao pk vel: 2.55 m/s
Area-P 1/2: 4.52 cm2
P 1/2 time: 536 msec
S' Lateral: 2.8 cm

## 2022-03-23 DIAGNOSIS — H353231 Exudative age-related macular degeneration, bilateral, with active choroidal neovascularization: Secondary | ICD-10-CM | POA: Diagnosis not present

## 2022-03-23 DIAGNOSIS — H35721 Serous detachment of retinal pigment epithelium, right eye: Secondary | ICD-10-CM | POA: Diagnosis not present

## 2022-03-23 DIAGNOSIS — H3581 Retinal edema: Secondary | ICD-10-CM | POA: Diagnosis not present

## 2022-03-24 DIAGNOSIS — M79672 Pain in left foot: Secondary | ICD-10-CM | POA: Diagnosis not present

## 2022-03-24 DIAGNOSIS — B351 Tinea unguium: Secondary | ICD-10-CM | POA: Diagnosis not present

## 2022-03-24 DIAGNOSIS — M79671 Pain in right foot: Secondary | ICD-10-CM | POA: Diagnosis not present

## 2022-04-27 ENCOUNTER — Ambulatory Visit (INDEPENDENT_AMBULATORY_CARE_PROVIDER_SITE_OTHER): Payer: Medicare HMO | Admitting: *Deleted

## 2022-04-27 VITALS — BP 110/68 | HR 101 | Ht 61.0 in | Wt 145.2 lb

## 2022-04-27 DIAGNOSIS — Z0000 Encounter for general adult medical examination without abnormal findings: Secondary | ICD-10-CM | POA: Diagnosis not present

## 2022-04-27 NOTE — Progress Notes (Addendum)
Subjective:   Kimberly Rodriguez is a 88 y.o. female who presents for Medicare Annual (Subsequent) preventive examination.  Review of Systems    Defer to PCP Cardiac Risk Factors include: hypertension;advanced age (>62mn, >>51women)     Objective:    Today's Vitals   04/27/22 1346  BP: 110/68  Pulse: (!) 101  Weight: 145 lb 3.2 oz (65.9 kg)  Height: '5\' 1"'$  (1.549 m)   Body mass index is 27.44 kg/m.     04/27/2022    1:50 PM 04/22/2021    1:46 PM 12/06/2019   10:58 AM 12/05/2018   10:11 AM 03/08/2018    3:31 PM 12/02/2017   10:14 AM 12/01/2016   10:19 AM  Advanced Directives  Does Patient Have a Medical Advance Directive? No;Yes Yes Yes Yes Yes Yes Yes  Type of AParamedicof [place]iver[place]Living will HC[place]Living will HLogan Elm VillageLiving will HNew [place]Living will  [place]McDermittLiving will HCitron[place]Living will  Does patient want to make changes to medical advance directive? No - Patient declined  No - Patient declined No - Patient declined No - Patient declined    Copy of H[place]in Chart? Yes - validated most recent copy scanned in chart (See row information) Yes - validated most recent copy scanned in chart (See row information) No - copy requested No - copy requested  No - copy requested No - copy requested  Would patient like information on creating a medical advance directive? No - Patient declined          Current Medications (verified) Outpatient Encounter Medications as of 04/27/2022  Medication Sig   amLODipine (NORVASC) 2.5 MG tablet Take 1 tablet (2.5 mg total) by mouth daily.   Calcium Carbonate (CALTRATE 600 PO) Take 1 tablet by mouth daily.   cholecalciferol (VITAMIN D3) 25 MCG (1000 UNIT) tablet Take 1,000 Units by mouth daily.   denosumab (PROLIA) 60 MG/ML SOLN injection Inject 60 mg into the skin every 6 (six) months. Administer in  upper arm, thigh, or abdomen   Docusate Calcium (STOOL SOFTENER PO) Take 1 capsule by mouth daily.   FIBER PO Take 2 tablets by mouth daily.   fluticasone (FLONASE) 50 MCG/ACT nasal spray Place 2 sprays into both nostrils daily as needed.   Multiple Vitamins-Minerals (CENTRUM SILVER) CHEW Chew by mouth.   multivitamin-lutein (OCUVITE-LUTEIN) CAPS capsule Take 1 capsule by mouth daily.   pantoprazole (PROTONIX) 20 MG tablet TAKE 1 TABLET (20 MG TOTAL) BY MOUTH DAILY BEFORE BREAKFAST.   RSV vaccine recomb adjuvanted (AREXVY) 120 MCG/0.5ML injection Inject into the muscle.   No facility-administered encounter medications on file as of 04/27/2022.    Allergies (verified) Astelin [azelastine hcl]   History: Past Medical History:  Diagnosis Date   Anemia    Blood transfusion without reported diagnosis    Cataract    Closed left ankle fracture 01/2019   no surgery   Eustachian tube dysfunction    Right side, Dr. MLaurance Flatten ENT   Microhematuria    s/p eval by urology 2008: U/S, CT, cysto w/ neg Bx (dx w/ a renal cyst, see reports)   Osteoarthritis    h/o back pain, sees ortho s/p shots   Osteoporosis    Past Surgical History:  Procedure Laterality Date   ABDOMINAL HYSTERECTOMY     no oophorectomy per pt   APPENDECTOMY     COLONOSCOPY  DILATION AND CURETTAGE OF UTERUS     TONSILLECTOMY     Family History  Problem Relation Age of Onset   Heart disease Father    Prostate cancer Father    Heart disease Mother    Colon cancer Neg Hx    Breast cancer Neg Hx    Diabetes Neg Hx    Osteoporosis Neg Hx    Social History   Socioeconomic History   Marital status: Widowed    Spouse name: Not on file   Number of children: 0   Years of education: Not on file   Highest education level: Not on file  Occupational History   Occupation: retired  Tobacco Use   Smoking status: Former   Smokeless tobacco: Never  Substance and Sexual Activity   Alcohol use: Yes    Comment: socially  -wine   Drug use: No   Sexual activity: Not on file  Other Topics Concern   Not on file  Social History Narrative   Household -- lives by herself in a townhouse    Still drives    Emergency contact-- [name] (sister in law) lives in [place]  [phone]   She is her health POA   Social Determinants of Health   Financial Resource Strain: Low Risk  (07/02/2021)   Overall Financial Resource Strain (CARDIA)    Difficulty of Paying Living Expenses: Not hard at all  Food Insecurity: No Food Insecurity (04/27/2022)   Hunger Vital Sign    Worried About Running Out of Food in the Last Year: Never true    [place] in the Last Year: Never true  Transportation Needs: No Transportation Needs (04/27/2022)   PRAPARE - Hydrologist (Medical): No    Lack of Transportation (Non-Medical): No  Physical Activity: Insufficiently Active (07/02/2021)   Exercise Vital Sign    Days of Exercise per Week: 4 days    Minutes of Exercise per Session: 20 min  Stress: No Stress Concern Present (04/22/2021)   [place]    Feeling of Stress : Only a little  Social Connections: Moderately Integrated (04/22/2021)   Social Connection and Isolation Panel [NHANES]    Frequency of Communication with Friends and Family: More than three times a week    Frequency of Social Gatherings with Friends and Family: More than three times a week    Attends Religious Services: More than 4 times per year    Active Member of [employer] or Organizations: Yes    Attends Archivist Meetings: More than 4 times per year    Marital Status: Widowed    Tobacco Counseling Counseling given: Not Answered   Clinical Intake:  Pre-visit preparation completed: Yes  Pain : No/denies pain  Diabetes: No  How often do you need to have someone help you when you read instructions, pamphlets, or other written materials from your doctor or  pharmacy?: 1 - Never   Activities of Daily Living    04/27/2022    1:51 PM  In your present state of health, do you have any difficulty performing the following activities:  Hearing? 1  Comment wears hearing aids  Vision? 0  Difficulty concentrating or making decisions? 1  Walking or climbing stairs? 0  Dressing or bathing? 0  Doing errands, shopping? 0  Preparing Food and eating ? N  Using the Toilet? N  In the past six months, have you accidently leaked  urine? Y  Comment only if she waits to long to go to the restroom  Do you have problems with loss of bowel control? N  Managing your Medications? N  Managing your Finances? N  Housekeeping or managing your Housekeeping? N    Patient Care Team: Colon Branch, MD as PCP - General Roel Cluck, MD as Referring Physician (Ophthalmology) Laurance Flatten Velora Heckler., MD (Family Medicine)  Indicate any recent Medical Services you may have received from other than [facility] providers in the past year (date may be approximate).     Assessment:   This is a routine wellness examination for [place].  Hearing/Vision screen No results found.  Dietary issues and exercise activities discussed: Current Exercise Habits: The patient does not participate in regular exercise at present, Exercise limited by: None identified   Goals Addressed   None    Depression Screen    04/27/2022    1:51 PM 03/11/2022   10:26 AM 09/08/2021    1:00 PM 04/22/2021    1:51 PM 09/03/2020   10:11 AM 12/06/2019   11:00 AM 12/05/2018   10:11 AM  PHQ 2/9 Scores  PHQ - 2 Score 0 0 0 0 0 0 0    Fall Risk    04/27/2022    1:50 PM 03/11/2022   10:26 AM 09/08/2021    1:00 PM 04/22/2021    1:48 PM 12/30/2020    1:54 PM  [place] in the past year? 0 0 0 0 0  Number falls in past yr: 0 0 0 0 0  Injury with Fall? 0 0 0 0 0  Risk for fall due to : No Fall Risks      Follow up Falls evaluation completed Falls evaluation completed Falls evaluation completed Falls  prevention discussed Falls evaluation completed    [place]:  Any stairs in or around the home? No  If so, are there any without handrails?  No stairs Home free of loose throw rugs in walkways, pet beds, electrical cords, etc? Yes  Adequate lighting in your home to reduce risk of falls? Yes   ASSISTIVE DEVICES UTILIZED TO PREVENT FALLS:  Life alert? Yes  Use of a cane, walker or w/c? No  Grab bars in the bathroom? Yes  Shower chair or bench in shower? Yes  Elevated toilet seat or a handicapped toilet? Yes   TIMED UP AND GO:  Was the test performed? Yes  .    Cognitive Function:    12/02/2017   10:22 AM 12/01/2016   10:20 AM  MMSE - Mini Mental State Exam  Orientation to time 5 5  Orientation to Place 5 5  Registration 3 3  Attention/ Calculation 5 5  Recall 3 2  Language- name 2 objects 2 2  Language- repeat 1 1  Language- follow 3 step command 3 3  Language- read & follow direction 1 1  Write a sentence 1 1  Copy design 1 1  Total score 30 29        04/27/2022    1:57 PM  6CIT Screen  What Year? 0 points  What month? 0 points  What time? 0 points  Count back from 20 0 points  Months in reverse 0 points  Repeat phrase 4 points  Total Score 4 points    Immunizations Immunization History  Administered Date(s) Administered   Fluad Quad(high Dose 65+) 03/16/2019, 06/05/2021, 03/11/2022   Influenza  Split 02/29/2012   Influenza Whole 04/17/2008   Influenza, High Dose Seasonal PF 04/01/2015, 04/29/2016, 05/04/2017, 05/10/2018   Influenza,inj,Quad PF,6+ Mos 03/27/2014   Influenza-Unspecified 03/17/2020, 04/18/2020   PFIZER Comirnaty(Gray Top)Covid-19 Tri-Sucrose Vaccine 12/04/2020   PFIZER(Purple Top)SARS-COV-2 Vaccination 06/30/2019, 07/21/2019, 03/17/2020   Pneumococcal Conjugate-13 03/27/2014   Pneumococcal Polysaccharide-23 09/30/2004, 05/10/2018   Respiratory Syncytial Virus Vaccine,Recomb Aduvanted(Arexvy) 03/11/2022    Td 03/03/2001   Tdap 12/11/2010, 11/04/2021   Zoster Recombinat (Shingrix) 07/18/2018, 10/13/2020   Zoster, Live 11/29/2008    TDAP status: Up to date  Flu Vaccine status: Up to date  Pneumococcal vaccine status: Up to date  Covid-19 vaccine status: Information provided on how to obtain vaccines.   Qualifies for Shingles Vaccine? Yes   Zostavax completed Yes   Shingrix Completed?: Yes  Screening Tests Health Maintenance  Topic Date Due   COVID-19 Vaccine (5 - Pfizer series) 01/29/2021   Medicare Annual Wellness (AWV)  04/22/2022   TETANUS/TDAP  11/05/2031   Pneumonia Vaccine 92+ Years old  Completed   INFLUENZA VACCINE  Completed   DEXA SCAN  Completed   Zoster Vaccines- Shingrix  Completed   HPV VACCINES  Aged Out    Health Maintenance  Health Maintenance Due  Topic Date Due   COVID-19 Vaccine (5 - Pfizer series) 01/29/2021   Medicare Annual Wellness (AWV)  04/22/2022    Colorectal cancer screening: No longer required.   Mammogram status: Completed 02/17/22. Repeat every year  Bone Density status: Completed 09/11/21. Results reflect: Bone density results: OSTEOPOROSIS. Repeat every 2 years.  Lung Cancer Screening: (Low Dose CT Chest recommended if Age 28-80 years, 30 pack-year currently smoking OR have quit w/in 15years.) does not qualify.   Lung Cancer Screening Referral: N/a  Additional Screening:  Hepatitis C Screening: does not qualify  Vision Screening: Recommended annual ophthalmology exams for early detection of glaucoma and other disorders of the eye. Is the patient up to date with their annual eye exam?  Yes  Who is the provider or what is the name of the office in which the patient attends annual eye exams? Dr. Manuella Ghazi If pt is not established with a provider, would they like to be referred to a provider to establish care? No .   Dental Screening: Recommended annual dental exams for proper oral hygiene  Community Resource Referral / Chronic Care  Management: CRR required this visit?  No   CCM required this visit?  No      Plan:     I have personally reviewed and noted the following in the patient's chart:   Medical and social history Use of alcohol, tobacco or illicit drugs  Current medications and supplements including opioid prescriptions. Patient is not currently taking opioid prescriptions. Functional ability and status Nutritional status Physical activity Advanced directives List of other physicians Hospitalizations, surgeries, and ER visits in previous 12 months Vitals Screenings to include cognitive, depression, and falls Referrals and appointments  In addition, I have reviewed and discussed with patient certain preventive protocols, quality metrics, and best practice recommendations. A written personalized care plan for preventive services as well as general preventive health recommendations were provided to patient.     Beatris Ship, [place]   04/27/2022   Nurse Notes: None  I have reviewed and agree with [facility] documentation.  Kathlene November, MD

## 2022-04-27 NOTE — Patient Instructions (Addendum)
[name] , Thank you for taking time to come for your Medicare Wellness Visit. I appreciate your ongoing commitment to your health goals. Please review the following plan we discussed and let me know if I can assist you in the future.   These are the goals we discussed:  Goals       DIET - INCREASE WATER INTAKE      Increase physical activity      Do weightbearing exercises       Maintain current healthy lifestyle. (pt-stated)        This is a list of the screening recommended for you and due dates:  Health Maintenance  Topic Date Due   COVID-19 Vaccine (5 - Pfizer series) 01/29/2021   Medicare Annual Wellness Visit  04/28/2023   Tetanus Vaccine  11/05/2031   Pneumonia Vaccine  Completed   Flu Shot  Completed   DEXA scan (bone density measurement)  Completed   Zoster (Shingles) Vaccine  Completed   HPV Vaccine  Aged Out     Next appointment: Follow up in one year for your annual wellness visit.   Preventive Care 57 Years and Older, Female Preventive care refers to lifestyle choices and visits with your health care provider that can promote health and wellness. What does preventive care include? A yearly physical exam. This is also called an annual well check. Dental exams once or twice a year. Routine eye exams. Ask your health care provider how often you should have your eyes checked. Personal lifestyle choices, including: Daily care of your teeth and gums. Regular physical activity. Eating a healthy diet. Avoiding tobacco and drug use. Limiting alcohol use. Practicing safe sex. Taking low-dose aspirin every day. Taking vitamin and mineral supplements as recommended by your health care provider. What happens during an annual well check? The services and screenings done by your health care provider during your annual well check will depend on your age, overall health, lifestyle risk factors, and family history of disease. Counseling  Your health care provider  may ask you questions about your: Alcohol use. Tobacco use. Drug use. Emotional well-being. Home and relationship well-being. Sexual activity. Eating habits. History of falls. Memory and ability to understand (cognition). Work and work Statistician. Reproductive health. Screening  You may have the following tests or measurements: Height, weight, and BMI. Blood pressure. Lipid and cholesterol levels. These may be checked every 5 years, or more frequently if you are over 72 years old. Skin check. Lung cancer screening. You may have this screening every year starting at age 43 if you have a 30-pack-year history of smoking and currently smoke or have quit within the past 15 years. Fecal occult blood test (FOBT) of the stool. You may have this test every year starting at age 51. Flexible sigmoidoscopy or colonoscopy. You may have a sigmoidoscopy every 5 years or a colonoscopy every 10 years starting at age 62. Hepatitis C blood test. Hepatitis B blood test. Sexually transmitted disease (STD) testing. Diabetes screening. This is done by checking your blood sugar (glucose) after you have not eaten for a while (fasting). You may have this done every 1-3 years. Bone density scan. This is done to screen for osteoporosis. You may have this done starting at age 51. Mammogram. This may be done every 1-2 years. Talk to your health care provider about how often you should have regular mammograms. Talk with your health care provider about your test results, treatment options, and if necessary, the need for  more tests. Vaccines  Your health care provider may recommend certain vaccines, such as: Influenza vaccine. This is recommended every year. Tetanus, diphtheria, and acellular pertussis (Tdap, Td) vaccine. You may need a Td booster every 10 years. Zoster vaccine. You may need this after age 62. Pneumococcal 13-valent conjugate (PCV13) vaccine. One dose is recommended after age 48. Pneumococcal  polysaccharide (PPSV23) vaccine. One dose is recommended after age 10. Talk to your health care provider about which screenings and vaccines you need and how often you need them. This information is not intended to replace advice given to you by your health care provider. Make sure you discuss any questions you have with your health care provider. Document Released: 07/04/2015 Document Revised: 02/25/2016 Document Reviewed: 04/08/2015 Elsevier Interactive Patient Education  2017 [place] Prevention in the Home Falls can cause injuries. They can happen to people of all ages. There are many things you can do to make your home safe and to help prevent falls. What can I do on the outside of my home? Regularly fix the edges of walkways and driveways and fix any cracks. Remove anything that might make you trip as you walk through a door, such as a raised step or threshold. Trim any bushes or trees on the path to your home. Use bright outdoor lighting. Clear any walking paths of anything that might make someone trip, such as rocks or tools. Regularly check to see if handrails are loose or broken. Make sure that both sides of any steps have handrails. Any raised decks and porches should have guardrails on the edges. Have any leaves, snow, or ice cleared regularly. Use sand or salt on walking paths during winter. Clean up any spills in your garage right away. This includes oil or grease spills. What can I do in the bathroom? Use night lights. Install grab bars by the toilet and in the tub and shower. Do not use towel bars as grab bars. Use non-skid mats or decals in the tub or shower. If you need to sit down in the shower, use a plastic, non-slip stool. Keep the floor dry. Clean up any water that spills on the floor as soon as it happens. Remove soap buildup in the tub or shower regularly. Attach bath mats securely with double-sided non-slip rug tape. Do not have throw rugs and other  things on the floor that can make you trip. What can I do in the bedroom? Use night lights. Make sure that you have a light by your bed that is easy to reach. Do not use any sheets or blankets that are too big for your bed. They should not hang down onto the floor. Have a firm chair that has side arms. You can use this for support while you get dressed. Do not have throw rugs and other things on the floor that can make you trip. What can I do in the kitchen? Clean up any spills right away. Avoid walking on wet floors. Keep items that you use a lot in easy-to-reach places. If you need to reach something above you, use a strong step stool that has a grab bar. Keep electrical cords out of the way. Do not use floor polish or wax that makes floors slippery. If you must use wax, use non-skid floor wax. Do not have throw rugs and other things on the floor that can make you trip. What can I do with my stairs? Do not leave any items on the stairs. Make  sure that there are handrails on both sides of the stairs and use them. Fix handrails that are broken or loose. Make sure that handrails are as long as the stairways. Check any carpeting to make sure that it is firmly attached to the stairs. Fix any carpet that is loose or worn. Avoid having throw rugs at the top or bottom of the stairs. If you do have throw rugs, attach them to the floor with carpet tape. Make sure that you have a light switch at the top of the stairs and the bottom of the stairs. If you do not have them, ask someone to add them for you. What else can I do to help prevent falls? Wear shoes that: Do not have high heels. Have rubber bottoms. Are comfortable and fit you well. Are closed at the toe. Do not wear sandals. If you use a stepladder: Make sure that it is fully opened. Do not climb a closed stepladder. Make sure that both sides of the stepladder are locked into place. Ask someone to hold it for you, if possible. Clearly  mark and make sure that you can see: Any grab bars or handrails. First and last steps. Where the edge of each step is. Use tools that help you move around (mobility aids) if they are needed. These include: Canes. Walkers. Scooters. Crutches. Turn on the lights when you go into a dark area. Replace any light bulbs as soon as they burn out. Set up your furniture so you have a clear path. Avoid moving your furniture around. If any of your floors are uneven, fix them. If there are any pets around you, be aware of where they are. Review your medicines with your doctor. Some medicines can make you feel dizzy. This can increase your chance of falling. Ask your doctor what other things that you can do to help prevent falls. This information is not intended to replace advice given to you by your health care provider. Make sure you discuss any questions you have with your health care provider. Document Released: 04/03/2009 Document Revised: 11/13/2015 Document Reviewed: 07/12/2014 Elsevier Interactive Patient Education  2017 [employer].

## 2022-06-15 DIAGNOSIS — H35721 Serous detachment of retinal pigment epithelium, right eye: Secondary | ICD-10-CM | POA: Diagnosis not present

## 2022-06-15 DIAGNOSIS — H3581 Retinal edema: Secondary | ICD-10-CM | POA: Diagnosis not present

## 2022-06-15 DIAGNOSIS — H353231 Exudative age-related macular degeneration, bilateral, with active choroidal neovascularization: Secondary | ICD-10-CM | POA: Diagnosis not present

## 2022-06-23 DIAGNOSIS — M79672 Pain in left foot: Secondary | ICD-10-CM | POA: Diagnosis not present

## 2022-06-23 DIAGNOSIS — B351 Tinea unguium: Secondary | ICD-10-CM | POA: Diagnosis not present

## 2022-06-23 DIAGNOSIS — M79671 Pain in right foot: Secondary | ICD-10-CM | POA: Diagnosis not present

## 2022-07-02 NOTE — Telephone Encounter (Signed)
Prolia VOB initiated via [email or web address]  Last Prolia inj 01/19/22 Next Prolia inj due 07/23/22

## 2022-07-22 ENCOUNTER — Telehealth: Payer: Self-pay | Admitting: Internal Medicine

## 2022-07-22 NOTE — Telephone Encounter (Signed)
Pt stated she got a call about prolia and wanted to talk about when she is due. Please advise.

## 2022-07-22 NOTE — Telephone Encounter (Signed)
Left message on machine to bring in new insurance card or call with any questions.

## 2022-07-22 NOTE — Telephone Encounter (Signed)
[employer] policy has termed, please reach out to pt to update insurance.   Thanks!

## 2022-07-26 ENCOUNTER — Telehealth: Payer: Self-pay | Admitting: Internal Medicine

## 2022-07-26 NOTE — Telephone Encounter (Signed)
Pt came in office stated that CMA was needing her new insurance card copy on file so pt can be submitted an approval for prolia shot. Copy of [employer] was put on pt's chart and updated. Please submit prolia approval.

## 2022-07-27 ENCOUNTER — Other Ambulatory Visit (HOSPITAL_COMMUNITY): Payer: Self-pay

## 2022-07-27 NOTE — Telephone Encounter (Signed)
Prolia VOB initiated via [email or web address]  Last Prolia inj: 01/19/22 Next Prolia inj DUE: was due 07/23/22   Insurance verification completed.    The current 180 day co-pay is, $300.00 (pharmacy benefit).   The patient is insured through [facility] Part D.

## 2022-07-28 ENCOUNTER — Other Ambulatory Visit (HOSPITAL_COMMUNITY): Payer: Self-pay

## 2022-07-28 NOTE — Telephone Encounter (Signed)
Tried calling but will try calling back.  Pt phone sounded like someone picked up and hung up.

## 2022-07-30 NOTE — Telephone Encounter (Addendum)
Pt ready for scheduling on or after 07/30/22  Out-of-pocket cost due at time of visit: $322  Primary: Medicare Prolia co-insurance: 20% (approximately $302) Admin fee co-insurance: $20  Deductible:   Secondary:  Prolia co-insurance:  Admin fee co-insurance:   Deductible:   Prior Auth: Approved PA# [id number] Valid: 07/30/2022-07/31/2023  ** This summary of benefits is an estimation of the patient's out-of-pocket cost. Exact cost may vary based on individual plan coverage.

## 2022-08-03 ENCOUNTER — Telehealth: Payer: Self-pay | Admitting: *Deleted

## 2022-08-03 NOTE — Telephone Encounter (Signed)
Left message on machine to call back to schedule for prolia.

## 2022-08-03 NOTE — Telephone Encounter (Signed)
You4 minutes ago (8:21 AM)    Left message on machine to call back to schedule for prolia.      Note   Rivka Safer T, CPhT routed conversation to [employer]; Rx Prior Auth Team4 days ago   Rivka Safer T, CPhT4 days ago    Pt ready for scheduling on or after 07/30/22   Out-of-pocket cost due at time of visit: $322   Primary: Medicare Prolia co-insurance: 20% (approximately $302) Admin fee co-insurance: $20   Deductible:    Secondary:  Prolia co-insurance:  Admin fee co-insurance:    Deductible:    Prior Auth: Approved PA# [id number] Valid: 07/30/2022-07/31/2023   ** This summary of benefits is an estimation of the patient's out-of-pocket cost. Exact cost may vary based on individual plan coverage.       Note

## 2022-08-09 NOTE — Telephone Encounter (Signed)
Pt scheduled for 08/11/22

## 2022-08-11 ENCOUNTER — Ambulatory Visit (INDEPENDENT_AMBULATORY_CARE_PROVIDER_SITE_OTHER): Payer: Medicare Other

## 2022-08-11 ENCOUNTER — Telehealth: Payer: Self-pay

## 2022-08-11 DIAGNOSIS — M810 Age-related osteoporosis without current pathological fracture: Secondary | ICD-10-CM

## 2022-08-11 MED ORDER — DENOSUMAB 60 MG/ML ~~LOC~~ SOSY
60.0000 mg | PREFILLED_SYRINGE | Freq: Once | SUBCUTANEOUS | Status: AC
  Administered 2022-08-11: 60 mg via SUBCUTANEOUS

## 2022-08-11 NOTE — Telephone Encounter (Signed)
Pt came in today for prolia , please schedule for next injection and also wants some assistance to help pay for injections as well

## 2022-08-11 NOTE — Progress Notes (Signed)
Patient came in today for prolia,  per Dr.Paz  Prolia given in left arm , subcutaneous, patient tolerated injection well.

## 2022-08-12 ENCOUNTER — Ambulatory Visit: Payer: Medicare Other

## 2022-08-12 NOTE — Telephone Encounter (Signed)
Prolia given yesterday.  Is there any way she can get help that you know of to help with cost of this?  Not sure how much it will be at next injection.

## 2022-08-13 ENCOUNTER — Other Ambulatory Visit (HOSPITAL_COMMUNITY): Payer: Self-pay

## 2022-08-16 NOTE — Telephone Encounter (Signed)
No grants have been available and the patient assistance is very limited. Patient can call and see if they are eligible.

## 2022-08-16 NOTE — Telephone Encounter (Signed)
Last Prolia inj 08/11/22 Next Prolia inj due 02/10/23

## 2022-08-17 ENCOUNTER — Encounter: Payer: Self-pay | Admitting: *Deleted

## 2022-08-17 NOTE — Telephone Encounter (Signed)
Pt notified but she was unable to hardly hear me on the phone.  I advised her that I will mail her this information.

## 2022-08-19 DIAGNOSIS — H353231 Exudative age-related macular degeneration, bilateral, with active choroidal neovascularization: Secondary | ICD-10-CM | POA: Diagnosis not present

## 2022-08-19 DIAGNOSIS — H18413 Arcus senilis, bilateral: Secondary | ICD-10-CM | POA: Diagnosis not present

## 2022-08-19 DIAGNOSIS — H25042 Posterior subcapsular polar age-related cataract, left eye: Secondary | ICD-10-CM | POA: Diagnosis not present

## 2022-08-19 DIAGNOSIS — H52203 Unspecified astigmatism, bilateral: Secondary | ICD-10-CM | POA: Diagnosis not present

## 2022-08-19 DIAGNOSIS — H5203 Hypermetropia, bilateral: Secondary | ICD-10-CM | POA: Diagnosis not present

## 2022-08-19 DIAGNOSIS — H35363 Drusen (degenerative) of macula, bilateral: Secondary | ICD-10-CM | POA: Diagnosis not present

## 2022-08-19 DIAGNOSIS — H2512 Age-related nuclear cataract, left eye: Secondary | ICD-10-CM | POA: Diagnosis not present

## 2022-08-19 DIAGNOSIS — Z961 Presence of intraocular lens: Secondary | ICD-10-CM | POA: Diagnosis not present

## 2022-08-19 DIAGNOSIS — H43393 Other vitreous opacities, bilateral: Secondary | ICD-10-CM | POA: Diagnosis not present

## 2022-08-19 DIAGNOSIS — H25012 Cortical age-related cataract, left eye: Secondary | ICD-10-CM | POA: Diagnosis not present

## 2022-08-19 DIAGNOSIS — H43813 Vitreous degeneration, bilateral: Secondary | ICD-10-CM | POA: Diagnosis not present

## 2022-09-02 DIAGNOSIS — J3089 Other allergic rhinitis: Secondary | ICD-10-CM | POA: Diagnosis not present

## 2022-09-02 DIAGNOSIS — J0141 Acute recurrent pansinusitis: Secondary | ICD-10-CM | POA: Diagnosis not present

## 2022-09-02 DIAGNOSIS — J029 Acute pharyngitis, unspecified: Secondary | ICD-10-CM | POA: Diagnosis not present

## 2022-09-07 DIAGNOSIS — H353221 Exudative age-related macular degeneration, left eye, with active choroidal neovascularization: Secondary | ICD-10-CM | POA: Diagnosis not present

## 2022-09-15 ENCOUNTER — Ambulatory Visit (INDEPENDENT_AMBULATORY_CARE_PROVIDER_SITE_OTHER): Payer: Medicare Other | Admitting: Internal Medicine

## 2022-09-15 ENCOUNTER — Encounter: Payer: Self-pay | Admitting: Internal Medicine

## 2022-09-15 VITALS — BP 130/76 | HR 79 | Temp 97.7°F | Resp 16 | Ht 61.0 in | Wt 144.0 lb

## 2022-09-15 DIAGNOSIS — M79671 Pain in right foot: Secondary | ICD-10-CM | POA: Diagnosis not present

## 2022-09-15 DIAGNOSIS — I10 Essential (primary) hypertension: Secondary | ICD-10-CM | POA: Diagnosis not present

## 2022-09-15 DIAGNOSIS — R739 Hyperglycemia, unspecified: Secondary | ICD-10-CM | POA: Diagnosis not present

## 2022-09-15 DIAGNOSIS — B351 Tinea unguium: Secondary | ICD-10-CM | POA: Diagnosis not present

## 2022-09-15 DIAGNOSIS — H353221 Exudative age-related macular degeneration, left eye, with active choroidal neovascularization: Secondary | ICD-10-CM | POA: Diagnosis not present

## 2022-09-15 DIAGNOSIS — M79672 Pain in left foot: Secondary | ICD-10-CM | POA: Diagnosis not present

## 2022-09-15 DIAGNOSIS — Z0000 Encounter for general adult medical examination without abnormal findings: Secondary | ICD-10-CM | POA: Diagnosis not present

## 2022-09-15 LAB — BASIC METABOLIC PANEL
BUN: 25 mg/dL — ABNORMAL HIGH (ref 6–23)
CO2: 32 mEq/L (ref 19–32)
Calcium: 8.9 mg/dL (ref 8.4–10.5)
Chloride: 104 mEq/L (ref 96–112)
Creatinine, Ser: 0.82 mg/dL (ref 0.40–1.20)
GFR: 61.6 mL/min (ref >60.00)
Glucose, Bld: 83 mg/dL (ref 70–99)
Potassium: 4.1 mEq/L (ref 3.5–5.1)
Sodium: 142 mEq/L (ref 135–145)

## 2022-09-15 LAB — HEMOGLOBIN A1C: Hgb A1c MFr Bld: 6.1 % (ref 4.6–6.5)

## 2022-09-15 LAB — CBC WITH DIFFERENTIAL/PLATELET
Basophils Absolute: 0 10*3/uL (ref 0.0–0.1)
Basophils Relative: 0.4 % (ref 0.0–3.0)
Eosinophils Absolute: 0.2 10*3/uL (ref 0.0–0.7)
Eosinophils Relative: 2 % (ref 0.0–5.0)
HCT: 42.5 % (ref 36.0–46.0)
Hemoglobin: 14 g/dL (ref 12.0–15.0)
Lymphocytes Relative: 20.2 % (ref 12.0–46.0)
Lymphs Abs: 1.6 10*3/uL (ref 0.7–4.0)
MCHC: 33.1 g/dL (ref 30.0–36.0)
MCV: 95 fl (ref 78.0–100.0)
Monocytes Absolute: 1 10*3/uL (ref 0.1–1.0)
Monocytes Relative: 12.3 % — ABNORMAL HIGH (ref 3.0–12.0)
Neutro Abs: 5.2 10*3/uL (ref 1.4–7.7)
Neutrophils Relative %: 65.1 % (ref 43.0–77.0)
Platelets: 232 10*3/uL (ref 150.0–400.0)
RBC: 4.47 Mil/uL (ref 3.87–5.11)
RDW: 14.2 % (ref 11.5–15.5)
WBC: 8 10*3/uL (ref 4.0–10.5)

## 2022-09-15 LAB — LIPID PANEL
Cholesterol: 210 mg/dL — ABNORMAL HIGH (ref 0–200)
HDL: 70.6 mg/dL (ref >39.00)
LDL Cholesterol: 123 mg/dL — ABNORMAL HIGH (ref 0–99)
NonHDL: 139.68
Total CHOL/HDL Ratio: 3
Triglycerides: 82 mg/dL (ref 0.0–149.0)
VLDL: 16.4 mg/dL (ref 0.0–40.0)

## 2022-09-15 NOTE — Progress Notes (Unsigned)
Subjective:    Patient ID: Kimberly Rodriguez, female    DOB: 07-28-1926, 89 y.o.   MRN: [id number]  DOS:  09/15/2022 Type of visit - description: CPX  In general feeling well. Recently went to urgent care with respiratory symptoms, prescribed antibiotics and prednisone.  She feels fully recuperated.  Review of Systems See above   Past Medical History:  Diagnosis Date   Anemia    Blood transfusion without reported diagnosis    Cataract    Closed left ankle fracture 01/2019   no surgery   Eustachian tube dysfunction    Right side, Dr. Laurance Flatten, ENT   Microhematuria    s/p eval by urology 2008: U/S, CT, cysto w/ neg Bx (dx w/ a renal cyst, see reports)   Osteoarthritis    h/o back pain, sees ortho s/p shots   Osteoporosis     Past Surgical History:  Procedure Laterality Date   ABDOMINAL HYSTERECTOMY     no oophorectomy per pt   APPENDECTOMY     COLONOSCOPY     DILATION AND CURETTAGE OF UTERUS     TONSILLECTOMY      Current Outpatient Medications  Medication Instructions   amLODipine (NORVASC) 2.5 mg, Oral, Daily   Calcium Carbonate (CALTRATE 600 PO) 1 tablet, Oral, Daily,     cholecalciferol (VITAMIN D3) 1,000 Units, Oral, Daily   denosumab (PROLIA) 60 mg, Subcutaneous, Every 6 months, Administer in upper arm, thigh, or abdomen    Docusate Calcium (STOOL SOFTENER PO) 1 capsule, Oral, Daily   FIBER PO 2 tablets, Oral, Daily,     fluticasone (FLONASE) 50 MCG/ACT nasal spray 2 sprays, Each Nare, Daily PRN   Multiple Vitamins-Minerals (CENTRUM SILVER) CHEW Oral,     multivitamin-lutein (OCUVITE-LUTEIN) CAPS capsule 1 capsule, Oral, Daily   pantoprazole (PROTONIX) 20 mg, Oral, Daily before breakfast       Objective:   Physical Exam BP 130/76   Pulse 79   Temp 97.7 F (36.5 C) (Oral)   Resp 16   Ht 5\' 1"  (1.549 m)   Wt 144 lb (65.3 kg)   SpO2 98%   BMI 27.21 kg/m  General: Well developed, NAD, BMI noted Neck: No  thyromegaly  HEENT:  Normocephalic . Face  symmetric, atraumatic Lungs:  CTA B Normal respiratory effort, no intercostal retractions, no accessory muscle use. Heart: RRR, + systolic murmur better heart right from the sternum. Abdomen:  Not distended, soft, non-tender. No rebound or rigidity.   Lower extremities: no pretibial edema bilaterally  Skin: Exposed areas without rash. Not pale. Not jaundice Neurologic:  alert & oriented X3.  Speech normal, gait appropriate for age and unassisted Strength symmetric and appropriate for age.  Psych: Cognition and judgment appear intact.  Cooperative with normal attention span and concentration.  Behavior appropriate. No anxious or depressed appearing.     Assessment    Assessment HTN Interstitial lung dz per chest x-ray 08-2014, PFTs 08-2014: minimal obstruction MSK: --DJD --Neck, back  pain Osteoporosis:  T score 04-2015 -3.1, prolia #2  11-2015  T score -3.4 (10/2016), saw Dr. Loanne Drilling, work-up for secondary etiologies negative, rx Prolia 03/2018 Microhematuria  s/p eval by urology 2008: U/S, CT, cysto w/ neg Bx (dx w/ a renal cyst, see reports) ET dysfuntion saw ENT Dr Laurance Flatten 2016 Anemia, saw GI: 06-2016: Cscope: polyps, tubular adenoma; EGD: cameron lesions, no Bx Murmur: Echo 09/2019: Mild to moderate MR and AS;  03/2022: normal EF, mild MV R, moderate AoS, see report  PLAN   Here for CPX HTN: On amlodipine, BP today is very good, ambulatory BPs reviewed, basically all WNL.  Rec to cont checking. Labs. Hyperglycemia: Check A1c. Osteoporosis: On Prolia.  Check labs Heart murmur: October 2023, normal EF, mild MV R, moderate AoS, see report   RTC 1 year.  Sooner if needed  Td  2012.  Booster recommended - PNM 23: 2006 and 04/2018 -prevnar:2015 - zostavax 2010 - shingrix- x2 -RSV done -Vaccines I recommend: PNM 20, COVID booster, flu shot every fall. -No further cervical ,Breast or colon cancer screening (did have a colonoscopy 06-2016 D/T anemia). Did have a MMG  01-2021 -Fall prevention discussed -Labs reviewed, BMP CBC A1c FLP- advance directives : on file  -Healthcare POA on file.    9=== HTN on amlodipine, ambulatory BPs in the morning range from 137/77 and no more than 158/78.  Most of her readings are acceptable.  Plan: Check BMP, continue same meds. Hyperglycemia: Mild, per chart review, check A1c Osteoporosis: Next Prolia 07-2022.  On calcium and vitamin D Heart murmur: Last echo 09-2019: MR, AAS, no symptoms, murmur slightly more noticeable today?.  Repeat echo. Flu shot today, vaccine advice: See AVS RTC 08-2022 for CPX

## 2022-09-15 NOTE — Patient Instructions (Addendum)
Please read the information about fall prevention  Vaccines I recommend: PNM 20, COVID booster, flu shot every fall.  Continue checking your blood pressure BP GOAL is between 110/65 and  135/85. If it is consistently higher or lower, let me know     GO TO THE LAB : Get the blood work     [place], [place] back for a physical exam in 1 year

## 2022-09-16 ENCOUNTER — Encounter: Payer: Self-pay | Admitting: Internal Medicine

## 2022-09-16 NOTE — Assessment & Plan Note (Signed)
Here for CPX HTN: On amlodipine, BP today is very good, ambulatory BPs reviewed, basically all WNL.  Rec to cont checking. Labs. Hyperglycemia: Check A1c. Osteoporosis: On Prolia.  Check labs Heart murmur: October 2023, normal EF, mild MV R, moderate AoS, see report RTC 1 year.  Sooner if needed

## 2022-09-16 NOTE — Assessment & Plan Note (Signed)
Td  2012.  Booster recommended - PNM 23: 2006 and 04/2018 -prevnar:2015 - zostavax 2010 - shingrix- x2 -RSV done -Vaccines I recommend: PNM 20, COVID booster, flu shot every fall. -No further cervical ,Breast or colon cancer screening (did have a colonoscopy 06-2016 D/T anemia). Did have a MMG 01-2021 -Fall prevention discussed -Labs reviewed, BMP CBC A1c FLP - advance directives : on file

## 2022-09-27 ENCOUNTER — Other Ambulatory Visit: Payer: Self-pay

## 2022-09-27 MED ORDER — PANTOPRAZOLE SODIUM 20 MG PO TBEC: 20.0000 mg | DELAYED_RELEASE_TABLET | Freq: Every day | ORAL | 1 refills | Status: DC

## 2022-09-27 MED ORDER — AMLODIPINE BESYLATE 2.5 MG PO TABS: 2.5000 mg | ORAL_TABLET | Freq: Every day | ORAL | 1 refills | Status: DC

## 2022-11-21 ENCOUNTER — Other Ambulatory Visit: Payer: Self-pay | Admitting: Internal Medicine

## 2022-11-30 DIAGNOSIS — H353221 Exudative age-related macular degeneration, left eye, with active choroidal neovascularization: Secondary | ICD-10-CM | POA: Diagnosis not present

## 2022-12-08 DIAGNOSIS — L84 Corns and callosities: Secondary | ICD-10-CM | POA: Diagnosis not present

## 2022-12-08 DIAGNOSIS — M79671 Pain in right foot: Secondary | ICD-10-CM | POA: Diagnosis not present

## 2022-12-08 DIAGNOSIS — M79672 Pain in left foot: Secondary | ICD-10-CM | POA: Diagnosis not present

## 2022-12-08 DIAGNOSIS — B351 Tinea unguium: Secondary | ICD-10-CM | POA: Diagnosis not present

## 2023-01-13 ENCOUNTER — Telehealth (HOSPITAL_BASED_OUTPATIENT_CLINIC_OR_DEPARTMENT_OTHER): Payer: Self-pay

## 2023-02-01 ENCOUNTER — Telehealth: Payer: Self-pay | Admitting: Internal Medicine

## 2023-02-01 NOTE — Telephone Encounter (Signed)
Pt wants to know if a mammogram is needed. She received a notice saying it was time to schedule but she wasn't sure if it's still needed for her. Please call to advise.

## 2023-02-02 NOTE — Telephone Encounter (Signed)
No screening MMG is needed.  If she has a breast abnormality on self-examination that is different, then she needs a visit to be checked.  Please let her know

## 2023-02-02 NOTE — Telephone Encounter (Signed)
Pt called. LDVM

## 2023-02-09 ENCOUNTER — Telehealth: Payer: Self-pay | Admitting: *Deleted

## 2023-02-09 NOTE — Telephone Encounter (Signed)
Last Prolia 08/11/22 Pt due 02/10/23

## 2023-02-17 NOTE — Telephone Encounter (Signed)
Following up on the status of running benefits for pt?

## 2023-02-18 ENCOUNTER — Telehealth: Payer: Self-pay

## 2023-02-18 DIAGNOSIS — M810 Age-related osteoporosis without current pathological fracture: Secondary | ICD-10-CM

## 2023-02-18 NOTE — Telephone Encounter (Signed)
Created new encounter for Prolia BIV. Will route encounter back once benefit verification is complete.  

## 2023-02-18 NOTE — Telephone Encounter (Signed)
Prolia VOB initiated via [email or web address]   Next Prolia inj DUE: 02/10/23

## 2023-02-22 ENCOUNTER — Telehealth: Payer: Self-pay | Admitting: *Deleted

## 2023-02-22 ENCOUNTER — Other Ambulatory Visit (HOSPITAL_COMMUNITY): Payer: Self-pay

## 2023-02-22 NOTE — Telephone Encounter (Signed)
Call to schedule for prolia and was unable to leave message.  Patient can get Prolia from specialty for $300 or in office for $340.  If pt wants from pharmacy please let pt know that pharmacy will be calling (we will use [facility]).

## 2023-02-22 NOTE — Telephone Encounter (Signed)
Pt ready for scheduling for PROLIA on or after : 02/22/23  Out-of-pocket cost due at time of visit: $340  Primary: [employer] MEDICARE Prolia co-insurance: 20% Admin fee co-insurance: $20  Secondary: --- Prolia co-insurance:  Admin fee co-insurance:   Medical Benefit Details: Date Benefits were checked: 02/18/23 Deductible: NO/ Coinsurance: 20%/ Admin Fee: $20  Prior Auth: APPROVED PA# [id number] Expiration Date: 02/22/23-02/22/24   # of doses approved:2  Pharmacy benefit: Copay $300 If patient wants fill through the pharmacy benefit please send prescription to: [facility], and include estimated need by date in rx notes. Pharmacy will ship medication directly to the office.  Patient NOT eligible for Prolia Copay Card. Copay Card can make patient's cost as little as $25. Link to apply: https://www.amgensupportplus.com/copay  ** This summary of benefits is an estimation of the patient's out-of-pocket cost. Exact cost may very based on individual plan coverage.

## 2023-02-22 NOTE — Telephone Encounter (Addendum)
Call to schedule for prolia but was unable to leave message.  Patient can get Prolia from specialty for $300 or in office for $340.  If pt wants from pharmacy please let pt know that pharmacy will be calling (we will use [facility]).

## 2023-02-23 NOTE — Telephone Encounter (Signed)
Pt returned call regarding the Prolia. Advised her of message from [place]. Pt would like to get medication from pharmacy so that she gets the lower cost. Please advise.

## 2023-02-23 NOTE — Telephone Encounter (Signed)
Left message on machine to call back about Prolia.  If she would like to get in office it will be $340 or if she would like to to get from pharmacy it will be $300. On message advised to call back to let us know which she would like to do.

## 2023-02-24 ENCOUNTER — Other Ambulatory Visit (HOSPITAL_COMMUNITY): Payer: Self-pay

## 2023-02-24 MED ORDER — DENOSUMAB 60 MG/ML ~~LOC~~ SOSY
60.0000 mg | PREFILLED_SYRINGE | SUBCUTANEOUS | 0 refills | Status: AC
Start: 2023-02-24 — End: ?
  Filled 2023-02-24 – 2023-02-28 (×2): qty 1, 180d supply, fill #0

## 2023-02-24 NOTE — Telephone Encounter (Signed)
Left message that rx has been sent in and for her to be on the lookout for phone call from them.  Also she is ok to schedule for next week.

## 2023-02-24 NOTE — Addendum Note (Signed)
Addended by: Thelma Barge D on: 02/24/2023 10:00 AM   Modules accepted: Orders

## 2023-02-28 ENCOUNTER — Other Ambulatory Visit: Payer: Self-pay

## 2023-03-01 DIAGNOSIS — H353221 Exudative age-related macular degeneration, left eye, with active choroidal neovascularization: Secondary | ICD-10-CM | POA: Diagnosis not present

## 2023-03-03 ENCOUNTER — Other Ambulatory Visit (HOSPITAL_COMMUNITY): Payer: Self-pay

## 2023-03-03 NOTE — Telephone Encounter (Signed)
Medication will be here on 03/09/23 per pharmacy

## 2023-03-08 ENCOUNTER — Other Ambulatory Visit: Payer: Self-pay

## 2023-03-10 DIAGNOSIS — M79671 Pain in right foot: Secondary | ICD-10-CM | POA: Diagnosis not present

## 2023-03-10 DIAGNOSIS — L84 Corns and callosities: Secondary | ICD-10-CM | POA: Diagnosis not present

## 2023-03-10 DIAGNOSIS — M79672 Pain in left foot: Secondary | ICD-10-CM | POA: Diagnosis not present

## 2023-03-10 DIAGNOSIS — B351 Tinea unguium: Secondary | ICD-10-CM | POA: Diagnosis not present

## 2023-03-12 ENCOUNTER — Encounter (HOSPITAL_COMMUNITY): Payer: Self-pay

## 2023-03-15 ENCOUNTER — Ambulatory Visit: Payer: Medicare Other

## 2023-03-15 DIAGNOSIS — M810 Age-related osteoporosis without current pathological fracture: Secondary | ICD-10-CM | POA: Diagnosis not present

## 2023-03-15 MED ORDER — DENOSUMAB 60 MG/ML ~~LOC~~ SOSY
60.0000 mg | PREFILLED_SYRINGE | Freq: Once | SUBCUTANEOUS | Status: DC
Start: 2023-03-15 — End: 2023-07-28
  Administered 2023-03-15: 60 mg via SUBCUTANEOUS

## 2023-03-15 NOTE — Progress Notes (Signed)
Prolia 60 mg SQ , was administered L arm today. Patient tolerated injection. Patient next injection due: 6 months, appt made: No- will schedule in 5 months after benifits are ran again Initial injection: No Patient supplied: No   Melton Alar, CMA   DOD: Abner Greenspan

## 2023-03-21 ENCOUNTER — Telehealth: Payer: Self-pay

## 2023-03-21 MED ORDER — DENOSUMAB 60 MG/ML ~~LOC~~ SOSY
60.0000 mg | PREFILLED_SYRINGE | Freq: Once | SUBCUTANEOUS | Status: DC
Start: 2023-03-21 — End: 2023-03-21

## 2023-03-21 MED ORDER — DENOSUMAB 60 MG/ML ~~LOC~~ SOSY
60.0000 mg | PREFILLED_SYRINGE | Freq: Once | SUBCUTANEOUS | Status: AC
Start: 2023-03-21 — End: 2023-03-15
  Administered 2023-03-15: 60 mg via SUBCUTANEOUS

## 2023-03-21 NOTE — Addendum Note (Signed)
Addended by: CREFT, Melton Alar L on: 03/21/2023 10:12 AM   Modules accepted: Orders

## 2023-03-21 NOTE — Telephone Encounter (Signed)
Prolia received 03/15/23, due on/after 09/12/23.

## 2023-04-06 ENCOUNTER — Other Ambulatory Visit (HOSPITAL_BASED_OUTPATIENT_CLINIC_OR_DEPARTMENT_OTHER): Payer: Self-pay | Admitting: Internal Medicine

## 2023-04-06 DIAGNOSIS — Z1231 Encounter for screening mammogram for malignant neoplasm of breast: Secondary | ICD-10-CM

## 2023-04-11 ENCOUNTER — Encounter (HOSPITAL_BASED_OUTPATIENT_CLINIC_OR_DEPARTMENT_OTHER): Payer: Self-pay

## 2023-04-11 ENCOUNTER — Ambulatory Visit (HOSPITAL_BASED_OUTPATIENT_CLINIC_OR_DEPARTMENT_OTHER)
Admission: RE | Admit: 2023-04-11 | Discharge: 2023-04-11 | Disposition: A | Discharge status: Home / Self Care | Payer: Medicare Other | Source: Ambulatory Visit | Attending: Internal Medicine | Admitting: Internal Medicine

## 2023-04-11 DIAGNOSIS — Z1231 Encounter for screening mammogram for malignant neoplasm of breast: Secondary | ICD-10-CM | POA: Diagnosis not present

## 2023-05-05 ENCOUNTER — Ambulatory Visit (INDEPENDENT_AMBULATORY_CARE_PROVIDER_SITE_OTHER): Payer: Medicare Other

## 2023-05-05 VITALS — Ht 61.0 in | Wt 144.0 lb

## 2023-05-05 DIAGNOSIS — Z0000 Encounter for general adult medical examination without abnormal findings: Secondary | ICD-10-CM

## 2023-05-05 NOTE — Patient Instructions (Addendum)
Kimberly Rodriguez , Thank you for taking time to come for your Medicare Wellness Visit. I appreciate your ongoing commitment to your health goals. Please review the following plan we discussed and let me know if I can assist you in the future.   Referrals/Orders/Follow-Ups/Clinician Recommendations:   This is a list of the screening recommended for you and due dates:  Health Maintenance  Topic Date Due   COVID-19 Vaccine (5 - 2023-24 season) 02/20/2023   Flu Shot  09/19/2023*   Medicare Annual Wellness Visit  05/04/2024   DTaP/Tdap/Td vaccine (4 - Td or Tdap) 11/05/2031   Pneumonia Vaccine  Completed   DEXA scan (bone density measurement)  Completed   Zoster (Shingles) Vaccine  Completed   HPV Vaccine  Aged Out  *Topic was postponed. The date shown is not the original due date.    Advanced directives: (In Chart) A copy of your advanced directives are scanned into your chart should your provider ever need it.  Next Medicare Annual Wellness Visit scheduled for next year: Yes

## 2023-05-05 NOTE — Progress Notes (Signed)
Subjective:   Kimberly Rodriguez is a 89 y.o. female who presents for Medicare Annual (Subsequent) preventive examination.  Visit Complete: Virtual I connected with  Kimberly Rodriguez on 05/05/23 by a audio enabled telemedicine application and verified that I am speaking with the correct person using two identifiers.  Patient Location: Home  Provider Location: Home Office  I discussed the limitations of evaluation and management by telemedicine. The patient expressed understanding and agreed to proceed.  Vital Signs: Because this visit was a virtual/telehealth visit, some criteria may be missing or patient reported. Any vitals not documented were not able to be obtained and vitals that have been documented are patient reported.    Cardiac Risk Factors include: advanced age (>43men, >57 women);hypertension     Objective:    Today's Vitals   05/05/23 1607  Weight: 144 lb (65.3 kg)  Height: 5\' 1"  (1.549 m)   Body mass index is 27.21 kg/m.     05/05/2023    4:16 PM 04/27/2022    1:50 PM 04/22/2021    1:46 PM 12/06/2019   10:58 AM 12/05/2018   10:11 AM 03/08/2018    3:31 PM 12/02/2017   10:14 AM  Advanced Directives  Does Patient Have a Medical Advance Directive? Yes No;Yes Yes Yes Yes Yes Yes  Type of Estate agent of [place];Living will Healthcare Power of [place];Living will Healthcare Power of [place];Living will Healthcare Power of [place];Living will Healthcare Power of [place];Living will  Healthcare Power of [place];Living will  Does patient want to make changes to medical advance directive? No - Patient declined No - Patient declined  No - Patient declined No - Patient declined No - Patient declined   Copy of Healthcare Power of Attorney in Chart? Yes - validated most recent copy scanned in chart (See row information) Yes - validated most recent copy scanned in chart (See row information) Yes - validated most recent copy scanned in chart (See row  information) No - copy requested No - copy requested  No - copy requested  Would patient like information on creating a medical advance directive?  No - Patient declined         Current Medications (verified) Outpatient Encounter Medications as of 05/05/2023  Medication Sig   amLODipine (NORVASC) 2.5 MG tablet TAKE 1 TABLET BY MOUTH DAILY   Calcium Carbonate (CALTRATE 600 PO) Take 1 tablet by mouth daily.   cholecalciferol (VITAMIN D3) 25 MCG (1000 UNIT) tablet Take 1,000 Units by mouth daily.   denosumab (PROLIA) 60 MG/ML SOSY injection Inject 60 mg into the skin every 6 (six) months. Dx code: M81.0   Docusate Calcium (STOOL SOFTENER PO) Take 1 capsule by mouth daily.   FIBER PO Take 2 tablets by mouth daily.   fluticasone (FLONASE) 50 MCG/ACT nasal spray Place 2 sprays into both nostrils daily as needed.   Multiple Vitamins-Minerals (CENTRUM SILVER) CHEW Chew by mouth.   multivitamin-lutein (OCUVITE-LUTEIN) CAPS capsule Take 1 capsule by mouth daily.   pantoprazole (PROTONIX) 20 MG tablet TAKE 1 TABLET BY MOUTH DAILY  BEFORE BREAKFAST   Facility-Administered Encounter Medications as of 05/05/2023  Medication   denosumab (PROLIA) injection 60 mg    Allergies (verified) Astelin [azelastine hcl]   History: Past Medical History:  Diagnosis Date   Anemia    Blood transfusion without reported diagnosis    Cataract    Closed left ankle fracture 01/2019   no surgery   Eustachian tube dysfunction    Right side,  Dr. Christell Constant, ENT   Microhematuria    s/p eval by urology 2008: U/S, CT, cysto w/ neg Bx (dx w/ a renal cyst, see reports)   Osteoarthritis    h/o back pain, sees ortho s/p shots   Osteoporosis    Past Surgical History:  Procedure Laterality Date   ABDOMINAL HYSTERECTOMY     no oophorectomy per pt   APPENDECTOMY     COLONOSCOPY     DILATION AND CURETTAGE OF UTERUS     TONSILLECTOMY     Family History  Problem Relation Age of Onset   Heart disease Father    Prostate  cancer Father    Heart disease Mother    Colon cancer Neg Hx    Breast cancer Neg Hx    Diabetes Neg Hx    Osteoporosis Neg Hx    Social History   Socioeconomic History   Marital status: Widowed    Spouse name: Not on file   Number of children: 0   Years of education: Not on file   Highest education level: Not on file  Occupational History   Occupation: retired  Tobacco Use   Smoking status: Former   Smokeless tobacco: Never  Substance and Sexual Activity   Alcohol use: Yes    Comment: socially -wine   Drug use: No   Sexual activity: Not on file  Other Topics Concern   Not on file  Social History Narrative   Household -- lives by herself in a townhouse    Still drives    Emergency contact-- Isabelle Course (sister in law) lives in [place]  [phone]   She is her health POA   Social Determinants of Health   Financial Resource Strain: Low Risk  (05/05/2023)   Overall Financial Resource Strain (CARDIA)    Difficulty of Paying Living Expenses: Not hard at all  Food Insecurity: No Food Insecurity (05/05/2023)   Hunger Vital Sign    Worried About Running Out of Food in the Last Year: Never true    Ran Out of Food in the Last Year: Never true  Transportation Needs: No Transportation Needs (05/05/2023)   PRAPARE - Administrator, Civil Service (Medical): No    Lack of Transportation (Non-Medical): No  Physical Activity: Inactive (05/05/2023)   Exercise Vital Sign    Days of Exercise per Week: 0 days    Minutes of Exercise per Session: 0 min  Stress: No Stress Concern Present (05/05/2023)   [employer] of Occupational Health - Occupational Stress Questionnaire    Feeling of Stress : Not at all  Social Connections: Moderately Integrated (05/05/2023)   Social Connection and Isolation Panel [NHANES]    Frequency of Communication with Friends and Family: More than three times a week    Frequency of Social Gatherings with Friends and Family: More than three times a  week    Attends Religious Services: More than 4 times per year    Active Member of [employer] or Organizations: Yes    Attends Banker Meetings: More than 4 times per year    Marital Status: Widowed    Tobacco Counseling Counseling given: Not Answered   Clinical Intake:  Pre-visit preparation completed: Yes  Pain : No/denies pain     BMI - recorded: 27.21 Nutritional Status: BMI 25 -29 Overweight Nutritional Risks: None Diabetes: No  How often do you need to have someone help you when you read instructions, pamphlets, or other written materials from your doctor  or pharmacy?: 1 - Never  Interpreter Needed?: No  Information entered by :: Theresa Mulligan LPN   Activities of Daily Living    05/05/2023    4:14 PM  In your present state of health, do you have any difficulty performing the following activities:  Hearing? 1  Comment Wears hearing aids  Vision? 0  Difficulty concentrating or making decisions? 0  Walking or climbing stairs? 0  Dressing or bathing? 0  Doing errands, shopping? 0  Preparing Food and eating ? N  Using the Toilet? N  In the past six months, have you accidently leaked urine? N  Do you have problems with loss of bowel control? N  Managing your Medications? N  Managing your Finances? N  Housekeeping or managing your Housekeeping? N    Patient Care Team: Wanda Plump, MD as PCP - General Lovenia Shuck, MD as Referring Physician (Ophthalmology) Christell Constant Rowe Robert., MD (Family Medicine)  Indicate any recent Medical Services you may have received from other than [facility] providers in the past year (date may be approximate).     Assessment:   This is a routine wellness examination for [place].  Hearing/Vision screen Hearing Screening - Comments:: Wears hearing aids Vision Screening - Comments:: Wears rx glasses - up to date with routine eye exams with  Dr Lum Keas   Goals Addressed               This Visit's Progress     Stay  Active (pt-stated)         Depression Screen    05/05/2023    4:13 PM 09/15/2022    9:14 AM 04/27/2022    1:51 PM 03/11/2022   10:26 AM 09/08/2021    1:00 PM 04/22/2021    1:51 PM 09/03/2020   10:11 AM  PHQ 2/9 Scores  PHQ - 2 Score 0 0 0 0 0 0 0    Fall Risk    05/05/2023    4:15 PM 09/15/2022    9:14 AM 04/27/2022    1:50 PM 03/11/2022   10:26 AM 09/08/2021    1:00 PM  Fall Risk   Falls in the past year? 1 0 0 0 0  Number falls in past yr: 0 0 0 0 0  Injury with Fall? 0 0 0 0 0  Risk for fall due to : No Fall Risks  No Fall Risks    Follow up Falls prevention discussed Falls evaluation completed Falls evaluation completed Falls evaluation completed Falls evaluation completed    MEDICARE RISK AT HOME: Medicare Risk at Home Any stairs in or around the home?: No If so, are there any without handrails?: No Home free of loose throw rugs in walkways, pet beds, electrical cords, etc?: Yes Adequate lighting in your home to reduce risk of falls?: Yes Life alert?: Yes Use of a cane, walker or w/c?: No Grab bars in the bathroom?: Yes Shower chair or bench in shower?: Yes Elevated toilet seat or a handicapped toilet?: Yes  TIMED UP AND GO:  Was the test performed?  No    Cognitive Function:    12/02/2017   10:22 AM 12/01/2016   10:20 AM  MMSE - Mini Mental State Exam  Orientation to time 5 5  Orientation to Place 5 5  Registration 3 3  Attention/ Calculation 5 5  Recall 3 2  Language- name 2 objects 2 2  Language- repeat 1 1  Language- follow 3 step command 3 3  Language- read & follow direction 1 1  Write a sentence 1 1  Copy design 1 1  Total score 30 29        05/05/2023    4:16 PM 04/27/2022    1:57 PM  6CIT Screen  What Year? 0 points 0 points  What month? 0 points 0 points  What time? 0 points 0 points  Count back from 20 0 points 0 points  Months in reverse 0 points 0 points  Repeat phrase 0 points 4 points  Total Score 0 points 4 points     Immunizations Immunization History  Administered Date(s) Administered   Fluad Quad(high Dose 65+) 03/16/2019, 06/05/2021, 03/11/2022   Influenza Split 02/29/2012   Influenza Whole 04/17/2008   Influenza, High Dose Seasonal PF 04/01/2015, 04/29/2016, 05/04/2017, 05/10/2018   Influenza,inj,Quad PF,6+ Mos 03/27/2014   Influenza-Unspecified 03/17/2020, 04/18/2020   PFIZER Comirnaty(Gray Top)Covid-19 Tri-Sucrose Vaccine 12/04/2020   PFIZER(Purple Top)SARS-COV-2 Vaccination 06/30/2019, 07/21/2019, 03/17/2020   Pneumococcal Conjugate-13 03/27/2014   Pneumococcal Polysaccharide-23 09/30/2004, 05/10/2018   Respiratory Syncytial Virus Vaccine,Recomb Aduvanted(Arexvy) 03/11/2022   Td 03/03/2001   Tdap 12/11/2010, 11/04/2021   Zoster Recombinant(Shingrix) 07/18/2018, 10/13/2020   Zoster, Live 11/29/2008    TDAP status: Up to date  Flu Vaccine status: Due, Education has been provided regarding the importance of this vaccine. Advised may receive this vaccine at local pharmacy or Health Dept. Aware to provide a copy of the vaccination record if obtained from local pharmacy or Health Dept. Verbalized acceptance and understanding.  Pneumococcal vaccine status: Up to date  Covid-19 vaccine status: Declined, Education has been provided regarding the importance of this vaccine but patient still declined. Advised may receive this vaccine at local pharmacy or Health Dept.or vaccine clinic. Aware to provide a copy of the vaccination record if obtained from local pharmacy or Health Dept. Verbalized acceptance and understanding.  Qualifies for Shingles Vaccine? Yes   Zostavax completed Yes   Shingrix Completed?: Yes  Screening Tests Health Maintenance  Topic Date Due   COVID-19 Vaccine (5 - 2023-24 season) 02/20/2023   INFLUENZA VACCINE  09/19/2023 (Originally 01/20/2023)   Medicare Annual Wellness (AWV)  05/04/2024   DTaP/Tdap/Td (4 - Td or Tdap) 11/05/2031   Pneumonia Vaccine 23+ Years old   Completed   DEXA SCAN  Completed   Zoster Vaccines- Shingrix  Completed   HPV VACCINES  Aged Out    Health Maintenance  Health Maintenance Due  Topic Date Due   COVID-19 Vaccine (5 - 2023-24 season) 02/20/2023           Additional Screening:Bone Density status: Completed 09/10/21. Results reflect: Bone density results: OSTEOPOROSIS. Repeat every   years.    Vision Screening: Recommended annual ophthalmology exams for early detection of glaucoma and other disorders of the eye. Is the patient up to date with their annual eye exam?  Yes  Who is the provider or what is the name of the office in which the patient attends annual eye exams? Dr Lum Keas If pt is not established with a provider, would they like to be referred to a provider to establish care? No .   Dental Screening: Recommended annual dental exams for proper oral hygiene    Community Resource Referral / Chronic Care Management:  CRR required this visit?  No   CCM required this visit?  No     Plan:     I have personally reviewed and noted the following in the patient's chart:   Medical and social history Use  of alcohol, tobacco or illicit drugs  Current medications and supplements including opioid prescriptions. Patient is not currently taking opioid prescriptions. Functional ability and status Nutritional status Physical activity Advanced directives List of other physicians Hospitalizations, surgeries, and ER visits in previous 12 months Vitals Screenings to include cognitive, depression, and falls Referrals and appointments  In addition, I have reviewed and discussed with patient certain preventive protocols, quality metrics, and best practice recommendations. A written personalized care plan for preventive services as well as general preventive health recommendations were provided to patient.     Tillie Rung, LPN   [id number]/63/[id number]   After Visit Summary: (MyChart) Due to this being a telephonic  visit, the after visit summary with patients personalized plan was offered to patient via MyChart   Nurse Notes: None

## 2023-05-27 ENCOUNTER — Other Ambulatory Visit (HOSPITAL_BASED_OUTPATIENT_CLINIC_OR_DEPARTMENT_OTHER): Payer: Self-pay

## 2023-05-27 MED ORDER — COVID-19 MRNA VAC-TRIS(PFIZER) 30 MCG/0.3ML IM SUSY
0.3000 mL | PREFILLED_SYRINGE | Freq: Once | INTRAMUSCULAR | 0 refills | Status: AC
  Filled 2023-05-27: qty 0.3, 1d supply, fill #0

## 2023-06-07 DIAGNOSIS — H353221 Exudative age-related macular degeneration, left eye, with active choroidal neovascularization: Secondary | ICD-10-CM | POA: Diagnosis not present

## 2023-06-09 DIAGNOSIS — M79672 Pain in left foot: Secondary | ICD-10-CM | POA: Diagnosis not present

## 2023-06-09 DIAGNOSIS — B351 Tinea unguium: Secondary | ICD-10-CM | POA: Diagnosis not present

## 2023-06-09 DIAGNOSIS — M79671 Pain in right foot: Secondary | ICD-10-CM | POA: Diagnosis not present

## 2023-06-09 DIAGNOSIS — L84 Corns and callosities: Secondary | ICD-10-CM | POA: Diagnosis not present

## 2023-06-17 DIAGNOSIS — J329 Chronic sinusitis, unspecified: Secondary | ICD-10-CM | POA: Diagnosis not present

## 2023-07-01 ENCOUNTER — Ambulatory Visit: Payer: Self-pay | Admitting: Internal Medicine

## 2023-07-01 DIAGNOSIS — I080 Rheumatic disorders of both mitral and aortic valves: Secondary | ICD-10-CM | POA: Diagnosis not present

## 2023-07-01 DIAGNOSIS — R4182 Altered mental status, unspecified: Secondary | ICD-10-CM | POA: Diagnosis not present

## 2023-07-01 DIAGNOSIS — R7303 Prediabetes: Secondary | ICD-10-CM | POA: Diagnosis not present

## 2023-07-01 DIAGNOSIS — R29704 NIHSS score 4: Secondary | ICD-10-CM | POA: Diagnosis not present

## 2023-07-01 DIAGNOSIS — I491 Atrial premature depolarization: Secondary | ICD-10-CM | POA: Diagnosis not present

## 2023-07-01 DIAGNOSIS — I63511 Cerebral infarction due to unspecified occlusion or stenosis of right middle cerebral artery: Secondary | ICD-10-CM | POA: Diagnosis not present

## 2023-07-01 DIAGNOSIS — I618 Other nontraumatic intracerebral hemorrhage: Secondary | ICD-10-CM | POA: Diagnosis not present

## 2023-07-01 DIAGNOSIS — G969 Disorder of central nervous system, unspecified: Secondary | ICD-10-CM | POA: Diagnosis not present

## 2023-07-01 DIAGNOSIS — I639 Cerebral infarction, unspecified: Secondary | ICD-10-CM | POA: Diagnosis not present

## 2023-07-01 DIAGNOSIS — I6782 Cerebral ischemia: Secondary | ICD-10-CM | POA: Diagnosis not present

## 2023-07-01 DIAGNOSIS — I6523 Occlusion and stenosis of bilateral carotid arteries: Secondary | ICD-10-CM | POA: Diagnosis not present

## 2023-07-01 DIAGNOSIS — Z961 Presence of intraocular lens: Secondary | ICD-10-CM | POA: Diagnosis not present

## 2023-07-01 DIAGNOSIS — F411 Generalized anxiety disorder: Secondary | ICD-10-CM | POA: Diagnosis not present

## 2023-07-01 DIAGNOSIS — R29706 NIHSS score 6: Secondary | ICD-10-CM | POA: Diagnosis not present

## 2023-07-01 DIAGNOSIS — I6011 Nontraumatic subarachnoid hemorrhage from right middle cerebral artery: Secondary | ICD-10-CM | POA: Diagnosis not present

## 2023-07-01 DIAGNOSIS — I619 Nontraumatic intracerebral hemorrhage, unspecified: Secondary | ICD-10-CM | POA: Insufficient documentation

## 2023-07-01 DIAGNOSIS — I700 Atherosclerosis of aorta: Secondary | ICD-10-CM | POA: Diagnosis not present

## 2023-07-01 DIAGNOSIS — M159 Polyosteoarthritis, unspecified: Secondary | ICD-10-CM | POA: Diagnosis not present

## 2023-07-01 DIAGNOSIS — M810 Age-related osteoporosis without current pathological fracture: Secondary | ICD-10-CM | POA: Diagnosis not present

## 2023-07-01 DIAGNOSIS — R29810 Facial weakness: Secondary | ICD-10-CM | POA: Diagnosis not present

## 2023-07-01 DIAGNOSIS — Z87891 Personal history of nicotine dependence: Secondary | ICD-10-CM | POA: Diagnosis not present

## 2023-07-01 DIAGNOSIS — I10 Essential (primary) hypertension: Secondary | ICD-10-CM | POA: Diagnosis not present
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
## 2023-07-02 DIAGNOSIS — I639 Cerebral infarction, unspecified: Secondary | ICD-10-CM | POA: Diagnosis not present
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
## 2023-07-03 DIAGNOSIS — I639 Cerebral infarction, unspecified: Secondary | ICD-10-CM | POA: Diagnosis not present
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
## 2023-07-05 NOTE — Discharge Summary (Signed)
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 Paticia Allene Fairly, MD [facility] OPH [facility] None  09/07/2023  9:20 AM Ozell Clyde Batman, MD [facility] OPH [facility] [id number] NUD

## 2023-07-06 ENCOUNTER — Telehealth: Payer: Self-pay | Admitting: *Deleted

## 2023-07-06 NOTE — Transitions of Care (Post Inpatient/ED Visit) (Signed)
 07/06/2023  Name: Kimberly Rodriguez MRN: [id number] DOB: 08-13-1926  Today's TOC FU Call Status: Today's TOC FU Call Status:: Successful TOC FU Call Completed TOC FU Call Complete Date: 07/06/23 Patient's Name and Date of Birth confirmed.  Transition Care Management Follow-up Telephone Call Date of Discharge: 07/05/23 Discharge Facility: Other Mud[facility]) Name of Other (Non[facility]) Discharge Facility: [facility] Type of Discharge: Inpatient Admission Primary Inpatient Discharge Diagnosis:: Cerebrovascular accident How have you been since you were released from the hospital?: Better Any questions or concerns?: No  Items Reviewed: Did you receive and understand the discharge instructions provided?: Yes Any new allergies since your discharge?: No Dietary orders reviewed?: No Do you have support at home?: Yes People in Home: alone Name of Support/Comfort Primary Source: Kimberly Rodriguez niece  Medications Reviewed Today: Medications Reviewed Today     Reviewed by Kimberly Grater, RN (Case Manager) on 07/06/23 at 1118  Med List Status: <None>   Medication Order Taking? Sig Documenting Provider Last Dose Status Informant  amLODipine  (NORVASC ) 2.5 MG tablet [id number] Yes TAKE 1 TABLET BY MOUTH DAILY Rodriguez, Kimberly E, MD Taking Active   Calcium Carbonate (CALTRATE 600 PO) [id number] Yes Take 1 tablet by mouth daily. [provider] Taking Active   cholecalciferol (VITAMIN D3) 25 MCG (1000 UNIT) tablet [id number] Yes Take 1,000 Units by mouth daily. [provider] Taking Active   denosumab  (PROLIA ) 60 MG/ML SOSY injection [id number] Yes Inject 60 mg into the skin every 6 (six) months. Dx code: M81.0 Kimberly Hollow, MD Taking Active   denosumab  (PROLIA ) injection 60 mg [id number]   Rodriguez, Kimberly E, MD  Active   Docusate Calcium (STOOL SOFTENER PO) [id number] Yes Take 1 capsule by mouth daily. [provider] Taking Active   FIBER PO [id number] Yes Take 2 tablets by mouth  daily. [provider] Taking Active   fluticasone (FLONASE) 50 MCG/ACT nasal spray [id number] Yes Place 2 sprays into both nostrils daily as needed. Kimberly Hollow, MD Taking Active            Med Note Author Rodriguez, Kimberly D   Thu Dec 04, 2020 10:13 AM)    Multiple Vitamins-Minerals (CENTRUM Harman) CHEW [id number] Yes Chew by mouth. [provider] Taking Active   multivitamin-lutein [facility]) CAPS capsule [id number] Yes Take 1 capsule by mouth daily. [provider] Taking Active   pantoprazole  (PROTONIX ) 20 MG tablet [id number] Yes TAKE 1 TABLET BY MOUTH DAILY  BEFORE BREAKFAST Rodriguez, Kimberly E, MD Taking Active             Home Care and Equipment/Supplies: Were Home Health Services Ordered?: Yes Name of Home Health Agency:: [facility] Has Agency set up a time to come to your home?: No EMR reviewed for Home Health Orders: Orders present/patient has not received call (refer to CM for follow-up) (RN gave niece number to follow up with Kimberly Rodriguez)  Functional Questionnaire: Do you need assistance with bathing/showering or dressing?: Yes Do you need assistance with meal preparation?: Yes Do you need assistance with eating?: No Do you have difficulty maintaining continence: No Do you need assistance with getting out of bed/getting out of a chair/moving?: No Do you have difficulty managing or taking your medications?: No  Follow up appointments reviewed: PCP Follow-up appointment confirmed?: Yes Date of PCP follow-up appointment?: 07/11/23 Follow-up Provider: [facility] Follow-up appointment confirmed?: No Reason Specialist Follow-Up Not Confirmed: Patient has Specialist Provider Number and will Call for Appointment (Niece is waiting for  the neurologist to call back) Do you need transportation to your follow-up appointment?: No Do you understand care options if your condition(s) worsen?: Yes-patient verbalized understanding  SDOH Interventions Today     Flowsheet Row Most Recent Value  SDOH Interventions   Food Insecurity Interventions Intervention Not Indicated  Housing Interventions Intervention Not Indicated  Transportation Interventions Intervention Not Indicated  Utilities Interventions Intervention Not Indicated      Interventions Today    Flowsheet Row Most Recent Value  Chronic Disease   Chronic disease during today's visit Other  [Cerebrovascular accident]  General Interventions   General Interventions Discussed/Reviewed General Interventions Discussed, General Interventions Reviewed, Doctor Visits  Doctor Visits Discussed/Reviewed Doctor Visits Discussed, Doctor Visits Reviewed, Specialist, PCP  PCP/Specialist Visits Compliance with follow-up visit  Pharmacy Interventions   Pharmacy Dicussed/Reviewed Pharmacy Topics Discussed, Pharmacy Topics Reviewed      Niece and patient are visiting Assisted living facilities for the patient to move to close to where she lives.   Patient niece called back and asked for a social worker  Scheduled follow up with Kimberly Rodriguez on 07/11/2023  Kimberly Rodriguez BSN RN Population Health- Transition of Care Team.  [facility] [phone]

## 2023-07-07 ENCOUNTER — Telehealth: Payer: Self-pay | Admitting: Internal Medicine

## 2023-07-07 NOTE — Telephone Encounter (Signed)
FL2 form just received this morning. Pt has hospital f/u on 07/11/23- will complete at that time.

## 2023-07-07 NOTE — Telephone Encounter (Signed)
Copied from CRM [id number]. Topic: General - Phone/Fax/Address >> Jul 06, 2023  4:57 PM Drema Balzarine wrote: Trey Paula called to confirm that office received fax regarding FL2 form and 3 addendum forms - please advise

## 2023-07-08 DIAGNOSIS — K449 Diaphragmatic hernia without obstruction or gangrene: Secondary | ICD-10-CM | POA: Diagnosis not present

## 2023-07-08 DIAGNOSIS — R41841 Cognitive communication deficit: Secondary | ICD-10-CM | POA: Diagnosis not present

## 2023-07-08 DIAGNOSIS — I69354 Hemiplegia and hemiparesis following cerebral infarction affecting left non-dominant side: Secondary | ICD-10-CM | POA: Diagnosis not present

## 2023-07-08 DIAGNOSIS — M810 Age-related osteoporosis without current pathological fracture: Secondary | ICD-10-CM | POA: Diagnosis not present

## 2023-07-08 DIAGNOSIS — M150 Primary generalized (osteo)arthritis: Secondary | ICD-10-CM | POA: Diagnosis not present

## 2023-07-08 DIAGNOSIS — J849 Interstitial pulmonary disease, unspecified: Secondary | ICD-10-CM | POA: Diagnosis not present

## 2023-07-08 DIAGNOSIS — Z7982 Long term (current) use of aspirin: Secondary | ICD-10-CM | POA: Diagnosis not present

## 2023-07-08 DIAGNOSIS — H35721 Serous detachment of retinal pigment epithelium, right eye: Secondary | ICD-10-CM | POA: Diagnosis not present

## 2023-07-08 DIAGNOSIS — R7303 Prediabetes: Secondary | ICD-10-CM | POA: Diagnosis not present

## 2023-07-08 DIAGNOSIS — I69320 Aphasia following cerebral infarction: Secondary | ICD-10-CM | POA: Diagnosis not present

## 2023-07-08 DIAGNOSIS — H43393 Other vitreous opacities, bilateral: Secondary | ICD-10-CM | POA: Diagnosis not present

## 2023-07-08 DIAGNOSIS — F411 Generalized anxiety disorder: Secondary | ICD-10-CM | POA: Diagnosis not present

## 2023-07-08 DIAGNOSIS — I10 Essential (primary) hypertension: Secondary | ICD-10-CM | POA: Diagnosis not present

## 2023-07-08 DIAGNOSIS — Z87891 Personal history of nicotine dependence: Secondary | ICD-10-CM | POA: Diagnosis not present

## 2023-07-08 DIAGNOSIS — Z9181 History of falling: Secondary | ICD-10-CM | POA: Diagnosis not present

## 2023-07-08 DIAGNOSIS — H353231 Exudative age-related macular degeneration, bilateral, with active choroidal neovascularization: Secondary | ICD-10-CM | POA: Diagnosis not present

## 2023-07-11 ENCOUNTER — Encounter: Payer: Self-pay | Admitting: Internal Medicine

## 2023-07-11 ENCOUNTER — Ambulatory Visit: Payer: PPO | Admitting: Internal Medicine

## 2023-07-11 ENCOUNTER — Ambulatory Visit: Payer: Self-pay | Admitting: Licensed Clinical Social Worker

## 2023-07-11 VITALS — BP 124/66 | HR 68 | Temp 98.1°F | Resp 16 | Ht 61.0 in | Wt 142.0 lb

## 2023-07-11 DIAGNOSIS — I69320 Aphasia following cerebral infarction: Secondary | ICD-10-CM

## 2023-07-11 DIAGNOSIS — E7800 Pure hypercholesterolemia, unspecified: Secondary | ICD-10-CM | POA: Diagnosis not present

## 2023-07-11 DIAGNOSIS — I619 Nontraumatic intracerebral hemorrhage, unspecified: Secondary | ICD-10-CM | POA: Diagnosis not present

## 2023-07-11 DIAGNOSIS — R739 Hyperglycemia, unspecified: Secondary | ICD-10-CM | POA: Diagnosis not present

## 2023-07-11 DIAGNOSIS — I10 Essential (primary) hypertension: Secondary | ICD-10-CM

## 2023-07-11 DIAGNOSIS — Z111 Encounter for screening for respiratory tuberculosis: Secondary | ICD-10-CM

## 2023-07-11 DIAGNOSIS — I639 Cerebral infarction, unspecified: Secondary | ICD-10-CM

## 2023-07-11 NOTE — Telephone Encounter (Signed)
Completed paperwork faxed back to [facility]- attn: Alvira Philips at [phone]. TB gold pending results, will fax once back.

## 2023-07-11 NOTE — Patient Instructions (Signed)
Visit Information  Thank you for taking time to visit with me today. Please don't hesitate to contact me if I can be of assistance to you.   Following are the goals we discussed today:   Goals Addressed             This Visit's Progress    patient needs assistance with level of care issues       Interventions:  LCSW spoke via phone today with client, Kimberly Rodriguez.  LCSW spoke via phone today with Kimberly Rodriguez, relative and contact for client.  Kimberly Rodriguez, client , gave LCSW verbal permission today for LCSW to speak with Kimberly Rodriguez, client relative via phone to discuss client needs and status.  Kimberly Rodriguez requested that LCSW speak with Kimberly Rodriguez.  Kimberly Rodriguez, client contact, also gave LCSW verbal permission to speak with Kimberly Rodriguez, client relative , about client needs and status.  Of note, Kimberly Rodriguez resides currently in [place].  Kimberly Rodriguez, client relative, is staying temporarily with client to help client with her current needs and assistance in looking for ALF facility for client to move to. Client is scheduled to meet today with PCP, Kimberly Rodriguez.  FL-2  completion will be discussed and possible discussion of ALF options in the area for client Kimberly Maidens RN had sent referral to LCSW for client and had placed client on LCSW schedule for today for LCSW to call Client has HTA insurance .  Client and family rep have been touring ALF facilities in area to determine ALF facility of choice for client [facility] received order to provide home health services for client in the home environment.   Client is scheduled to get HHPT with [facility] this week Client is scheduled to move to [facility] on [address] in [place], [place] later this week (Friday of this week) Client has history of stroke Kimberly Rodriguez and [name] have LCSW name and phone number and were encouraged to call LCSW as needed regarding SW needs of client           Client to admit to [facility] in [place], [place] on Friday of this week. Client and Kimberly Rodriguez both have LCSW name and phone number to call LCSW as needed for SW support   Please call the care guide team at [phone] if you need to cancel or reschedule your appointment.    If you are experiencing a Mental Health or Behavioral Health Crisis or need someone to talk to, please go to [facility] [address], [place] [phone])   The patient/ Kimberly Rodriguez, contact for client, have understood patient instructions and DECLINED to receive copy of patient instructions.   The patient / Kimberly Rodriguez,  contact for client, has been provided with contact information for the care management team and has been advised to call with any health related questions or concerns.   Lorna Few  MSW, LCSW [place] Licensed Clinical Social Worker Direct Dial:  [phone] Fax:  [phone] Website:  [email or web address]

## 2023-07-11 NOTE — Patient Instructions (Signed)
We are referring you to neurology  GO TO THE LAB : Get the blood work     Next visit with me in 2 to 3 months Please schedule it at the front desk
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
## 2023-07-11 NOTE — Telephone Encounter (Signed)
Received fax confirmation

## 2023-07-11 NOTE — Patient Outreach (Signed)
Care Coordination   Initial Visit Note   07/11/2023 Name: Kimberly Rodriguez MRN: [id number] DOB: 06-28-1926  Kimberly Rodriguez is a 89 y.o. year old female who sees Drue Novel, Nolon Rod, MD for primary care. I spoke with  Kimberly Rodriguez / Isabelle Course Renovato/ Georgia Lopes via phone today about client needs .  What matters to the patients health and wellness today? Patient needs assistance with level of care issues    Goals Addressed             This Visit's Progress    patient needs assistance with level of care issues       Interventions:  LCSW spoke via phone today with client, Kharter Borries.  LCSW spoke via phone today with Gus Rankin, relative and contact for client.  Lanny Hurst, client , gave LCSW verbal permission today for LCSW to speak with Georgia Lopes, client relative via phone to discuss client needs and status.  Clerissa Teed requested that LCSW speak with Georgia Lopes.  Gus Rankin, client contact, also gave LCSW verbal permission to speak with Georgia Lopes, client relative , about client needs and status.  Of note, Caiya Million resides currently in [place].  Georgia Lopes, client relative, is staying temporarily with client to help client with her current needs and assistance in looking for ALF facility for client to move to. Client is scheduled to meet today with PCP, Dr. Willow Ora.  FL-2  completion will be discussed and possible discussion of ALF options in the area for client Gean Maidens RN had sent referral to LCSW for client and had placed client on LCSW schedule for today for LCSW to call Client has HTA insurance .  Client and family rep have been touring ALF facilities in area to determine ALF facility of choice for client [facility] received order to provide home health services for client in the home environment.   Client is scheduled to get HHPT with [facility] this week Client is scheduled to move to [facility] on [address] in [place], [place] later this week (Friday of this week) Client has history of stroke [employer] and Isabelle Course Callegari have LCSW name and phone number and were encouraged to call LCSW as needed regarding SW needs of client           SDOH assessments and interventions completed:  Yes  SDOH Interventions Today    Flowsheet Row Most Recent Value  SDOH Interventions   Physical Activity Interventions Other (Comments)  [mobility challenges]  Stress Interventions Other (Comment)  [has stress in managing ADLs,  has stress related to level of care issues]        Care Coordination Interventions:  Yes, provided   Interventions Today    Flowsheet Row Most Recent Value  Chronic Disease   Chronic disease during today's visit Other  [spoke with Gus Rankin , relative and contact, about client needs]  General Interventions   General Interventions Discussed/Reviewed General Interventions Discussed, Community Resources  Education Interventions   Education Provided Provided Education  Provided Verbal Education On [employer]  Mental Health Interventions   Mental Health Discussed/Reviewed Coping Strategies  [client has anxiety issues,  she is trying to cope with managing ADLs needs. She is trying to cope with level of care needs for client]        Follow up plan: Client is scheduled to admit to [facility] in [place], [place] on Friday of this week.  Client and Gus Rankin both have LCSW name and phone number to call LCSW as needed for SW support   Encounter Outcome:  Patient Visit Completed    Lorna Few  MSW, LCSW [place] Licensed Clinical Social Worker Direct Dial:  [phone] Fax:  [phone] Website:  [email or web address]

## 2023-07-12 LAB — CBC WITH DIFFERENTIAL/PLATELET
Basophils Absolute: 0.1 10*3/uL (ref 0.0–0.1)
Basophils Relative: 0.7 % (ref 0.0–3.0)
Eosinophils Absolute: 0.1 10*3/uL (ref 0.0–0.7)
Eosinophils Relative: 1.1 % (ref 0.0–5.0)
HCT: 42.1 % (ref 36.0–46.0)
Hemoglobin: 13.8 g/dL (ref 12.0–15.0)
Lymphocytes Relative: 9.7 % — ABNORMAL LOW (ref 12.0–46.0)
Lymphs Abs: 1 10*3/uL (ref 0.7–4.0)
MCHC: 32.8 g/dL (ref 30.0–36.0)
MCV: 95.3 fL (ref 78.0–100.0)
Monocytes Absolute: 1.2 10*3/uL — ABNORMAL HIGH (ref 0.1–1.0)
Monocytes Relative: 12 % (ref 3.0–12.0)
Neutro Abs: 7.6 10*3/uL (ref 1.4–7.7)
Neutrophils Relative %: 76.5 % (ref 43.0–77.0)
Platelets: 220 10*3/uL (ref 150.0–400.0)
RBC: 4.42 Mil/uL (ref 3.87–5.11)
RDW: 14 % (ref 11.5–15.5)
WBC: 9.9 10*3/uL (ref 4.0–10.5)

## 2023-07-12 LAB — COMPREHENSIVE METABOLIC PANEL
ALT: 18 U/L (ref 0–35)
AST: 25 U/L (ref 0–37)
Albumin: 4 g/dL (ref 3.5–5.2)
Alkaline Phosphatase: 66 U/L (ref 39–117)
BUN: 21 mg/dL (ref 6–23)
CO2: 30 meq/L (ref 19–32)
Calcium: 8.9 mg/dL (ref 8.4–10.5)
Chloride: 104 meq/L (ref 96–112)
Creatinine, Ser: 0.67 mg/dL (ref 0.40–1.20)
GFR: 74.84 mL/min (ref >60.00)
Glucose, Bld: 109 mg/dL — ABNORMAL HIGH (ref 70–99)
Potassium: 4.1 meq/L (ref 3.5–5.1)
Sodium: 142 meq/L (ref 135–145)
Total Bilirubin: 0.5 mg/dL (ref 0.2–1.2)
Total Protein: 6 g/dL (ref 6.0–8.3)

## 2023-07-12 NOTE — Assessment & Plan Note (Addendum)
TCM 7 Acute stroke: Recovering from a large R MCA territory infarct with hemorrhagic conversion.  At this point, upper and lower extremity motor seems okay, she has some aphasia, reports diminished left eyesight from baseline. Imaging from recent admission.   - Had a head and neck CTA - Brain MRI. - Last CT head without contrast: 1. Stable since MRI 07/02/2023. Relatively large right MCA territory  infarct with confluent petechial hemorrhage/hematoma at the  posterior watershed area, and trace adjacent subarachnoid  hemorrhage.  2. No midline shift.  No new intracranial abnormality.  Echo: EF 60 to 65%, mild AAS, trace AR, moderate MR.  See full report Prior to discharge, cardiology okay for her to go back on aspirin and statins. Plan:  -CMP and CBC. - PT ST have been set up - Moving to [facility] assisted living soon, extensive paperwork completed. - Patient admits to some depression given recent health problems, she is counseled.  Family will look for increasing symptoms and let me know. HTN: BP today satisfactory, continue amlodipine Hyperglycemia: Diet controlled, most recent A1c was 6.1 High cholesterol: Last LDL 114, patient was started on atorvastatin 80 mg RTC 2 to 3 months

## 2023-07-13 ENCOUNTER — Encounter: Payer: Self-pay | Admitting: Internal Medicine

## 2023-07-14 LAB — QUANTIFERON-TB GOLD PLUS
Mitogen-NIL: 2.68 [IU]/mL
NIL: 0.03 [IU]/mL
QuantiFERON-TB Gold Plus: NEGATIVE
TB1-NIL: 0.01 [IU]/mL
TB2-NIL: 0.01 [IU]/mL

## 2023-07-14 NOTE — Telephone Encounter (Signed)
TB gold result faxed to [facility]. Fax confirmation received.

## 2023-07-15 ENCOUNTER — Encounter: Payer: Self-pay | Admitting: Internal Medicine

## 2023-07-18 ENCOUNTER — Telehealth: Payer: Self-pay

## 2023-07-18 NOTE — Telephone Encounter (Signed)
That is okay.  Thank you

## 2023-07-18 NOTE — Telephone Encounter (Signed)
Please advise- we put on FL2 form it just needed to be checked 1-2 times weekly.

## 2023-07-18 NOTE — Telephone Encounter (Signed)
Copied from CRM [id number]. Topic: General - Other >> Jul 18, 2023  8:35 AM Truddie Crumble wrote: Reason for CRM: Byrd Hesselbach (niece) called stating the patient has moved to an assisted living home ([facility]) and they need a doctors order for them to take her BP every morning. Byrd Hesselbach stated the order can be faxed to them  CB [phone]

## 2023-07-18 NOTE — Telephone Encounter (Signed)
Order faxed to [facility]

## 2023-07-20 ENCOUNTER — Telehealth: Payer: Self-pay

## 2023-07-20 DIAGNOSIS — H18513 Endothelial corneal dystrophy, bilateral: Secondary | ICD-10-CM | POA: Diagnosis not present

## 2023-07-20 DIAGNOSIS — M150 Primary generalized (osteo)arthritis: Secondary | ICD-10-CM | POA: Diagnosis not present

## 2023-07-20 DIAGNOSIS — M810 Age-related osteoporosis without current pathological fracture: Secondary | ICD-10-CM | POA: Diagnosis not present

## 2023-07-20 DIAGNOSIS — H25042 Posterior subcapsular polar age-related cataract, left eye: Secondary | ICD-10-CM | POA: Diagnosis not present

## 2023-07-20 DIAGNOSIS — H43813 Vitreous degeneration, bilateral: Secondary | ICD-10-CM | POA: Diagnosis not present

## 2023-07-20 DIAGNOSIS — J849 Interstitial pulmonary disease, unspecified: Secondary | ICD-10-CM | POA: Diagnosis not present

## 2023-07-20 DIAGNOSIS — H43393 Other vitreous opacities, bilateral: Secondary | ICD-10-CM | POA: Diagnosis not present

## 2023-07-20 DIAGNOSIS — F411 Generalized anxiety disorder: Secondary | ICD-10-CM | POA: Diagnosis not present

## 2023-07-20 DIAGNOSIS — H25012 Cortical age-related cataract, left eye: Secondary | ICD-10-CM | POA: Diagnosis not present

## 2023-07-20 DIAGNOSIS — H35721 Serous detachment of retinal pigment epithelium, right eye: Secondary | ICD-10-CM | POA: Diagnosis not present

## 2023-07-20 DIAGNOSIS — H2512 Age-related nuclear cataract, left eye: Secondary | ICD-10-CM | POA: Diagnosis not present

## 2023-07-20 DIAGNOSIS — H5203 Hypermetropia, bilateral: Secondary | ICD-10-CM | POA: Diagnosis not present

## 2023-07-20 DIAGNOSIS — H353231 Exudative age-related macular degeneration, bilateral, with active choroidal neovascularization: Secondary | ICD-10-CM | POA: Diagnosis not present

## 2023-07-20 DIAGNOSIS — I69354 Hemiplegia and hemiparesis following cerebral infarction affecting left non-dominant side: Secondary | ICD-10-CM | POA: Diagnosis not present

## 2023-07-20 DIAGNOSIS — I10 Essential (primary) hypertension: Secondary | ICD-10-CM | POA: Diagnosis not present

## 2023-07-20 DIAGNOSIS — H538 Other visual disturbances: Secondary | ICD-10-CM | POA: Diagnosis not present

## 2023-07-20 DIAGNOSIS — Z7982 Long term (current) use of aspirin: Secondary | ICD-10-CM | POA: Diagnosis not present

## 2023-07-20 DIAGNOSIS — H52203 Unspecified astigmatism, bilateral: Secondary | ICD-10-CM | POA: Diagnosis not present

## 2023-07-20 DIAGNOSIS — R7303 Prediabetes: Secondary | ICD-10-CM | POA: Diagnosis not present

## 2023-07-20 DIAGNOSIS — Z8673 Personal history of transient ischemic attack (TIA), and cerebral infarction without residual deficits: Secondary | ICD-10-CM | POA: Diagnosis not present

## 2023-07-20 DIAGNOSIS — K449 Diaphragmatic hernia without obstruction or gangrene: Secondary | ICD-10-CM | POA: Diagnosis not present

## 2023-07-20 DIAGNOSIS — H524 Presbyopia: Secondary | ICD-10-CM | POA: Diagnosis not present

## 2023-07-20 NOTE — Telephone Encounter (Signed)
Plan of care signed (order number: [id number]) and faxed back to [facility] at [phone]. Form sent for scanning.

## 2023-07-26 DIAGNOSIS — I10 Essential (primary) hypertension: Secondary | ICD-10-CM | POA: Diagnosis not present

## 2023-07-26 DIAGNOSIS — F411 Generalized anxiety disorder: Secondary | ICD-10-CM | POA: Diagnosis not present

## 2023-07-26 DIAGNOSIS — H3530 Unspecified macular degeneration: Secondary | ICD-10-CM | POA: Diagnosis not present

## 2023-07-26 DIAGNOSIS — Z8673 Personal history of transient ischemic attack (TIA), and cerebral infarction without residual deficits: Secondary | ICD-10-CM | POA: Diagnosis not present

## 2023-07-26 DIAGNOSIS — M810 Age-related osteoporosis without current pathological fracture: Secondary | ICD-10-CM | POA: Diagnosis not present

## 2023-07-28 ENCOUNTER — Other Ambulatory Visit: Payer: Self-pay | Admitting: *Deleted

## 2023-07-28 DIAGNOSIS — M810 Age-related osteoporosis without current pathological fracture: Secondary | ICD-10-CM

## 2023-07-28 MED ORDER — DENOSUMAB 60 MG/ML ~~LOC~~ SOSY
60.0000 mg | PREFILLED_SYRINGE | Freq: Once | SUBCUTANEOUS | Status: AC
  Administered 2023-09-13: 60 mg via SUBCUTANEOUS

## 2023-08-02 ENCOUNTER — Telehealth: Payer: Self-pay

## 2023-08-02 DIAGNOSIS — E785 Hyperlipidemia, unspecified: Secondary | ICD-10-CM | POA: Diagnosis not present

## 2023-08-02 DIAGNOSIS — E559 Vitamin D deficiency, unspecified: Secondary | ICD-10-CM | POA: Diagnosis not present

## 2023-08-02 DIAGNOSIS — Z79899 Other long term (current) drug therapy: Secondary | ICD-10-CM | POA: Diagnosis not present

## 2023-08-02 NOTE — Telephone Encounter (Signed)
Prolia VOB initiated via [email or web address]  Next Prolia inj DUE: 09/12/23

## 2023-08-03 DIAGNOSIS — F4321 Adjustment disorder with depressed mood: Secondary | ICD-10-CM | POA: Diagnosis not present

## 2023-08-05 ENCOUNTER — Other Ambulatory Visit (HOSPITAL_COMMUNITY): Payer: Self-pay

## 2023-08-05 NOTE — Telephone Encounter (Signed)
Pt ready for scheduling for PROLIA on or after : 09/12/23  Out-of-pocket cost due at time of visit: $332  Number of injection/visits approved: ---  Primary: [employer] Prolia co-insurance: 20% Admin fee co-insurance: 0%  Secondary: --- Prolia co-insurance:  Admin fee co-insurance:   Medical Benefit Details: Date Benefits were checked: 08/03/23 Deductible: NO/ Coinsurance: 20%/ Admin Fee: 0%  Prior Auth: N/A PA# Expiration Date:   # of doses approved:  Pharmacy benefit: Copay [phone] If patient wants fill through the pharmacy benefit please send prescription to: [employer], and include estimated need by date in rx notes. Pharmacy will ship medication directly to the office.  Patient NOT eligible for Prolia Copay Card. Copay Card can make patient's cost as little as $25. Link to apply: https://www.amgensupportplus.com/copay  ** This summary of benefits is an estimation of the patient's out-of-pocket cost. Exact cost may very based on individual plan coverage.

## 2023-08-10 DIAGNOSIS — I6930 Unspecified sequelae of cerebral infarction: Secondary | ICD-10-CM | POA: Diagnosis not present

## 2023-08-10 DIAGNOSIS — F4321 Adjustment disorder with depressed mood: Secondary | ICD-10-CM | POA: Diagnosis not present

## 2023-08-15 ENCOUNTER — Other Ambulatory Visit: Payer: Self-pay | Admitting: Internal Medicine

## 2023-08-17 DIAGNOSIS — F4321 Adjustment disorder with depressed mood: Secondary | ICD-10-CM | POA: Diagnosis not present

## 2023-08-18 ENCOUNTER — Other Ambulatory Visit: Payer: Self-pay

## 2023-08-18 DIAGNOSIS — F419 Anxiety disorder, unspecified: Secondary | ICD-10-CM | POA: Diagnosis not present

## 2023-08-18 DIAGNOSIS — J849 Interstitial pulmonary disease, unspecified: Secondary | ICD-10-CM | POA: Diagnosis not present

## 2023-08-26 DIAGNOSIS — R600 Localized edema: Secondary | ICD-10-CM | POA: Diagnosis not present

## 2023-08-26 DIAGNOSIS — I1310 Hypertensive heart and chronic kidney disease without heart failure, with stage 1 through stage 4 chronic kidney disease, or unspecified chronic kidney disease: Secondary | ICD-10-CM | POA: Diagnosis not present

## 2023-08-26 DIAGNOSIS — N182 Chronic kidney disease, stage 2 (mild): Secondary | ICD-10-CM | POA: Diagnosis not present

## 2023-08-30 ENCOUNTER — Other Ambulatory Visit: Payer: Self-pay

## 2023-08-30 ENCOUNTER — Encounter: Payer: Self-pay | Admitting: *Deleted

## 2023-08-30 DIAGNOSIS — H353231 Exudative age-related macular degeneration, bilateral, with active choroidal neovascularization: Secondary | ICD-10-CM | POA: Diagnosis not present

## 2023-08-30 DIAGNOSIS — H3581 Retinal edema: Secondary | ICD-10-CM | POA: Diagnosis not present

## 2023-08-30 DIAGNOSIS — H35721 Serous detachment of retinal pigment epithelium, right eye: Secondary | ICD-10-CM | POA: Diagnosis not present

## 2023-08-31 DIAGNOSIS — F4321 Adjustment disorder with depressed mood: Secondary | ICD-10-CM | POA: Diagnosis not present

## 2023-09-01 ENCOUNTER — Other Ambulatory Visit: Payer: Self-pay

## 2023-09-01 NOTE — Progress Notes (Signed)
 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

## 2023-09-05 DIAGNOSIS — M79672 Pain in left foot: Secondary | ICD-10-CM | POA: Diagnosis not present

## 2023-09-05 DIAGNOSIS — L84 Corns and callosities: Secondary | ICD-10-CM | POA: Diagnosis not present

## 2023-09-05 DIAGNOSIS — M79671 Pain in right foot: Secondary | ICD-10-CM | POA: Diagnosis not present

## 2023-09-05 DIAGNOSIS — B351 Tinea unguium: Secondary | ICD-10-CM | POA: Diagnosis not present

## 2023-09-12 NOTE — Progress Notes (Unsigned)
 Kimberly Rodriguez is a 89 y.o. female presents to the office today for Prolia injections, per physician's orders. Prolia 60 mg SQ , was administered L arm today. Patient tolerated injection. Patient next injection due: 6 months, appt made: No- will schedule in 5 months after benifits are ran again Initial injection: no Patient supplied: no CAM placed for next injection  Creft, Melton Alar L

## 2023-09-13 ENCOUNTER — Ambulatory Visit (INDEPENDENT_AMBULATORY_CARE_PROVIDER_SITE_OTHER)

## 2023-09-13 DIAGNOSIS — M810 Age-related osteoporosis without current pathological fracture: Secondary | ICD-10-CM

## 2023-09-13 MED ORDER — DENOSUMAB 60 MG/ML ~~LOC~~ SOSY: 60.0000 mg | PREFILLED_SYRINGE | SUBCUTANEOUS | Status: DC

## 2023-09-16 ENCOUNTER — Encounter: Payer: Medicare Other | Admitting: Internal Medicine

## 2023-09-16 ENCOUNTER — Encounter: Payer: Self-pay | Admitting: Internal Medicine

## 2023-09-16 DIAGNOSIS — N182 Chronic kidney disease, stage 2 (mild): Secondary | ICD-10-CM | POA: Diagnosis not present

## 2023-09-16 DIAGNOSIS — I1310 Hypertensive heart and chronic kidney disease without heart failure, with stage 1 through stage 4 chronic kidney disease, or unspecified chronic kidney disease: Secondary | ICD-10-CM | POA: Diagnosis not present

## 2023-09-20 DIAGNOSIS — I1310 Hypertensive heart and chronic kidney disease without heart failure, with stage 1 through stage 4 chronic kidney disease, or unspecified chronic kidney disease: Secondary | ICD-10-CM | POA: Diagnosis not present

## 2023-09-20 DIAGNOSIS — R600 Localized edema: Secondary | ICD-10-CM | POA: Diagnosis not present

## 2023-09-20 DIAGNOSIS — N182 Chronic kidney disease, stage 2 (mild): Secondary | ICD-10-CM | POA: Diagnosis not present

## 2023-09-27 DIAGNOSIS — R2689 Other abnormalities of gait and mobility: Secondary | ICD-10-CM | POA: Diagnosis not present

## 2023-09-27 DIAGNOSIS — I739 Peripheral vascular disease, unspecified: Secondary | ICD-10-CM | POA: Diagnosis not present

## 2023-09-27 DIAGNOSIS — R262 Difficulty in walking, not elsewhere classified: Secondary | ICD-10-CM | POA: Diagnosis not present

## 2023-09-27 DIAGNOSIS — R238 Other skin changes: Secondary | ICD-10-CM | POA: Diagnosis not present

## 2023-09-27 DIAGNOSIS — B351 Tinea unguium: Secondary | ICD-10-CM | POA: Diagnosis not present

## 2023-09-27 DIAGNOSIS — R059 Cough, unspecified: Secondary | ICD-10-CM | POA: Diagnosis not present

## 2023-09-27 DIAGNOSIS — I872 Venous insufficiency (chronic) (peripheral): Secondary | ICD-10-CM | POA: Diagnosis not present

## 2023-09-27 DIAGNOSIS — K449 Diaphragmatic hernia without obstruction or gangrene: Secondary | ICD-10-CM | POA: Diagnosis not present

## 2023-09-27 DIAGNOSIS — M6281 Muscle weakness (generalized): Secondary | ICD-10-CM | POA: Diagnosis not present

## 2023-09-27 DIAGNOSIS — R051 Acute cough: Secondary | ICD-10-CM | POA: Diagnosis not present

## 2023-09-27 DIAGNOSIS — R601 Generalized edema: Secondary | ICD-10-CM | POA: Diagnosis not present

## 2023-09-27 DIAGNOSIS — R600 Localized edema: Secondary | ICD-10-CM | POA: Diagnosis not present

## 2023-09-28 DIAGNOSIS — F4321 Adjustment disorder with depressed mood: Secondary | ICD-10-CM | POA: Diagnosis not present

## 2023-10-03 ENCOUNTER — Inpatient Hospital Stay: Admitting: Internal Medicine

## 2023-10-04 MED ORDER — DENOSUMAB 60 MG/ML ~~LOC~~ SOSY
60.0000 mg | PREFILLED_SYRINGE | SUBCUTANEOUS | Status: AC
Start: 2024-03-15 — End: 2024-03-29
  Administered 2024-03-29: 60 mg via SUBCUTANEOUS

## 2023-10-04 NOTE — Addendum Note (Signed)
 Addended by: Marylou Sobers D on: 10/04/2023 10:21 AM   Modules accepted: Orders

## 2023-10-05 ENCOUNTER — Ambulatory Visit (INDEPENDENT_AMBULATORY_CARE_PROVIDER_SITE_OTHER): Admitting: Internal Medicine

## 2023-10-05 ENCOUNTER — Encounter: Payer: Self-pay | Admitting: Internal Medicine

## 2023-10-05 VITALS — BP 144/84 | HR 88 | Temp 97.9°F | Resp 16 | Ht 61.0 in | Wt 147.2 lb

## 2023-10-05 DIAGNOSIS — R748 Abnormal levels of other serum enzymes: Secondary | ICD-10-CM | POA: Diagnosis not present

## 2023-10-05 DIAGNOSIS — R600 Localized edema: Secondary | ICD-10-CM | POA: Diagnosis not present

## 2023-10-05 DIAGNOSIS — I6930 Unspecified sequelae of cerebral infarction: Secondary | ICD-10-CM | POA: Diagnosis not present

## 2023-10-05 DIAGNOSIS — R739 Hyperglycemia, unspecified: Secondary | ICD-10-CM | POA: Diagnosis not present

## 2023-10-05 NOTE — Patient Instructions (Addendum)
 INSTRUCTIONS  FOR TODAY   Try to remain active.  But be  safe.  Follow a low-salt diet  Check the  blood pressure regularly Blood pressure goal:  between 110/65 and  135/85. If it is consistently higher or lower, let me know     GO TO THE LAB : Get the blood work     Next office visit for a checkup in 3 months Please make an appointment before you leave today

## 2023-10-05 NOTE — Progress Notes (Signed)
 Subjective:    Patient ID: Kimberly Rodriguez, female    DOB: 05/07/1927, 89 y.o.   MRN: [id number]  DOS:  10/05/2023 Type of visit - description: Acute  Went to urgent care 09/27/23 She requested evaluation for cough and chest congestion that started a few days prior to the visit. No fever or chills. Was prescribed an antibiotic. At this point she feels better. Denies chest pain or difficulty breathing.  The urgent care physician noted lower extremity edema.  He ordered additional blood work see below: Chest x-ray: No acute Blood work: CMP show elevated alkaline phosphatase and AST.  Creatinine normal. BNP normal. Hemoglobin 12.3.  The alkaline phosphatase is elevated, she denies nausea or vomiting.  Appetite is good.  No postprandial symptoms.  No abdominal pain.  Review of Systems See above   Past Medical History:  Diagnosis Date   Anemia    Blood transfusion without reported diagnosis    Cataract    Closed left ankle fracture 01/2019   no surgery   Eustachian tube dysfunction    Right side, Dr. Sulema Endo, ENT   Microhematuria    s/p eval by urology 2008: U/S, CT, cysto w/ neg Bx (dx w/ a renal cyst, see reports)   Osteoarthritis    h/o back pain, sees ortho s/p shots   Osteoporosis     Past Surgical History:  Procedure Laterality Date   ABDOMINAL HYSTERECTOMY     no oophorectomy per pt   APPENDECTOMY     COLONOSCOPY     DILATION AND CURETTAGE OF UTERUS     TONSILLECTOMY      Current Outpatient Medications  Medication Instructions   amLODipine  (NORVASC ) 2.5 mg, Oral, Daily   Calcium Carbonate (CALTRATE 600 PO) 1 tablet, Daily   cholecalciferol (VITAMIN D3) 1,000 Units, Daily   Docusate Calcium (STOOL SOFTENER PO) 1 capsule, Daily   FIBER PO 2 tablets, Daily   fluticasone (FLONASE) 50 MCG/ACT nasal spray 2 sprays, Daily PRN   Multiple Vitamins-Minerals (CENTRUM SILVER) CHEW Chew by mouth.   multivitamin-lutein (OCUVITE-LUTEIN) CAPS capsule 1 capsule, Daily    pantoprazole  (PROTONIX ) 20 mg, Oral, Daily before breakfast   Prolia  60 mg, Subcutaneous, Every 6 months, Dx code: M81.0       Objective:   Physical Exam BP (!) 144/84   Pulse 88   Temp 97.9 F (36.6 C) (Oral)   Resp 16   Ht 5\' 1"  (1.549 m)   Wt 147 lb 4 oz (66.8 kg)   SpO2 93%   BMI 27.82 kg/m  General:   Well developed, NAD, BMI noted. HEENT:  Normocephalic . Face symmetric, atraumatic Lungs:  CTA B Normal respiratory effort, no intercostal retractions, no accessory muscle use. Heart: RRR, + systolic murmur.  Lower extremities: Has compression stockings, +/+++ edema bilaterally. Skin: Not pale. Not jaundice Neurologic:  alert & oriented X3.  Her speech is more fluent compared to 06-2023, almost back to normal.  Good comprehension..  Gait assisted by cane. Psych--  Cognition and judgment appear intact.  Cooperative with normal attention span and concentration.  Behavior appropriate. No anxious or depressed appearing.      Assessment    Assessment HTN Interstitial lung dz per chest x-ray 08-2014, PFTs 08-2014: minimal obstruction MSK: --DJD --Neck, back  pain Osteoporosis:  T score 04-2015 -3.1, prolia  #2  11-2015  T score -3.4 (10/2016), saw Dr. Washington Hacker, work-up for secondary etiologies negative, rx Prolia  03/2018 Microhematuria  s/p eval by urology 2008: U/S, CT, cysto  w/ neg Bx (dx w/ a renal cyst, see reports) ET dysfuntion saw ENT Dr Sulema Endo 2016 Anemia, saw GI: 06-2016: Cscope: polyps, tubular adenoma; EGD: cameron lesions, no Bx Murmur: Echo 09/2019: Mild to moderate MR and AS;  03/2022: normal EF, mild MV R, moderate AoS, see report   PLAN Cough: Resolved.  No further treatment or evaluation is needed. Stroke sequelae : Large R MCA territory stroke with hemorrhagic conversion, making improvement compared to 06-2023. High cholesterol: Started atorvastatin few months ago, checking a FLP. Hyperglycemia, h/o  check A1c. Edema: Has developed since she was  transferred to a assisted living facility.  Doubt CCB's are the cause of swelling, she has been on amlodipine  for a while.  Could be lack of mobility.  Recommend low-salt diet, increase mobility if possible.  Reassess on RTC Increase alkaline phosphatase: Noted on blood work at the urgent care.  No GI symptoms.  Will check LFTs, GGT, AP isoenzymes.  No recent abdominal imaging.  Could be related to immobility from recent stroke.  Further advise w/results. HTN 144/84, BP at the urgent care few days ago 134/80.  Continue low-dose amlodipine . RTC 3 months

## 2023-10-06 LAB — HEPATIC FUNCTION PANEL
ALT: 38 U/L — ABNORMAL HIGH (ref 0–35)
AST: 36 U/L (ref 0–37)
Albumin: 3.7 g/dL (ref 3.5–5.2)
Alkaline Phosphatase: 207 U/L — ABNORMAL HIGH (ref 39–117)
Bilirubin, Direct: 0.1 mg/dL (ref 0.0–0.3)
Total Bilirubin: 0.5 mg/dL (ref 0.2–1.2)
Total Protein: 6.5 g/dL (ref 6.0–8.3)

## 2023-10-06 LAB — HEMOGLOBIN A1C: Hgb A1c MFr Bld: 5.8 % (ref 4.6–6.5)

## 2023-10-06 LAB — LIPID PANEL
Cholesterol: 128 mg/dL (ref 0–200)
HDL: 56.7 mg/dL (ref >39.00)
LDL Cholesterol: 62 mg/dL (ref 0–99)
NonHDL: 71.44
Total CHOL/HDL Ratio: 2
Triglycerides: 47 mg/dL (ref 0.0–149.0)
VLDL: 9.4 mg/dL (ref 0.0–40.0)

## 2023-10-06 LAB — GAMMA GT: GGT: 85 U/L — ABNORMAL HIGH (ref 7–51)

## 2023-10-07 NOTE — Assessment & Plan Note (Signed)
 Cough: Resolved.  No further treatment or evaluation is needed. Stroke sequelae : Large R MCA territory stroke with hemorrhagic conversion, making improvement compared to 06-2023. High cholesterol: Started atorvastatin few months ago, checking a FLP. Hyperglycemia, h/o  check A1c. Edema: Has developed since she was transferred to a assisted living facility.  Doubt CCB's are the cause of swelling, she has been on amlodipine  for a while.  Could be lack of mobility.  Recommend low-salt diet, increase mobility if possible.  Reassess on RTC Increase alkaline phosphatase: Noted on blood work at the urgent care.  No GI symptoms.  Will check LFTs, GGT, AP isoenzymes.  No recent abdominal imaging.  Could be related to immobility from recent stroke.  Further advise w/results. HTN 144/84, BP at the urgent care few days ago 134/80.  Continue low-dose amlodipine . RTC 3 months

## 2023-10-08 ENCOUNTER — Telehealth: Payer: Self-pay | Admitting: Internal Medicine

## 2023-10-08 DIAGNOSIS — R7989 Other specified abnormal findings of blood chemistry: Secondary | ICD-10-CM

## 2023-10-08 NOTE — Telephone Encounter (Signed)
 Copied from CRM [id number]. Topic: General - Other >> Oct 07, 2023  1:11 PM Kita Perish H wrote: Reason for CRM: Shelagh Derrick states she's patients proxy and has some questions for Dr. Neomi Banks from visit on 4/16, please reach back out, thanks.  Carmine Christ [phone]

## 2023-10-10 ENCOUNTER — Telehealth: Payer: Self-pay | Admitting: Internal Medicine

## 2023-10-10 NOTE — Telephone Encounter (Signed)
 LMOM for [facility] asking for call back if she has questions regarding visit.

## 2023-10-10 NOTE — Telephone Encounter (Signed)
 Copied from CRM [id number]. Topic: General - Other >> Oct 07, 2023  1:11 PM Kita Perish H wrote: Reason for CRM: Shelagh Derrick states she's patients proxy and has some questions for Dr. Neomi Banks from visit on 4/16, please reach back out, thanks.  Carmine Christ [phone] >> Oct 10, 2023 10:16 AM Chuck Crater wrote: Patient niece Shelagh Derrick is returning a call from Kaylyn. Please call Shelagh Derrick back.   Shelagh Derrick is pt's legal guardian

## 2023-10-10 NOTE — Telephone Encounter (Signed)
 Copied from CRM [id number]. Topic: General - Other >> Oct 10, 2023  3:59 PM Caliyah H wrote: Reason for CRM: Patient's niece, Shelagh Derrick, is returning a callback regarding her aunt.   Callback Number: [phone]

## 2023-10-10 NOTE — Telephone Encounter (Signed)
 Spoke w/ Shelagh Derrick- Pt's niece (On DPR and POA)- she wanted to see what was informed regarding Pt's liver functions- informed that they were slightly high- PCP was checking further tests, some are still pending. Maria verbalized understanding, she requests that PCP call her w/ results when they are back.

## 2023-10-10 NOTE — Telephone Encounter (Signed)
 Copied from CRM [id number]. Topic: General - Other >> Oct 07, 2023  1:11 PM Kita Perish H wrote: Reason for CRM: Shelagh Derrick states she's patients proxy and has some questions for Dr. Neomi Banks from visit on 4/16, please reach back out, thanks.  Carmine Christ [phone] >> Oct 10, 2023 10:16 AM Chuck Crater wrote: Patient niece Shelagh Derrick is returning a call from Yusuf Yu. Please call Shelagh Derrick back.   Shelagh Derrick is pt's legal guardian

## 2023-10-10 NOTE — Telephone Encounter (Signed)
 LMOM for Kimberly Rodriguez: LFTs elevated, recommend ultrasound of the abdomen, if patient declines further workup I would understand

## 2023-10-10 NOTE — Telephone Encounter (Signed)
 Duplicate message.

## 2023-10-11 DIAGNOSIS — R269 Unspecified abnormalities of gait and mobility: Secondary | ICD-10-CM | POA: Diagnosis not present

## 2023-10-11 DIAGNOSIS — Z7982 Long term (current) use of aspirin: Secondary | ICD-10-CM | POA: Diagnosis not present

## 2023-10-11 DIAGNOSIS — Z8673 Personal history of transient ischemic attack (TIA), and cerebral infarction without residual deficits: Secondary | ICD-10-CM | POA: Diagnosis not present

## 2023-10-11 LAB — ALKALINE PHOSPHATASE ISOENZYMES
Alkaline phosphatase (APISO): 193 U/L — ABNORMAL HIGH (ref 37–153)
Bone Isoenzymes: 17 % — ABNORMAL LOW (ref 28–66)
Intestinal Isoenzymes: 11 % (ref 1–24)
Liver Isoenzymes: 55 % (ref 25–69)
Macrohepatic isoenzymes: 17 % — ABNORMAL HIGH (ref <=0)
Placental isoenzymes: 0 % (ref <=0)

## 2023-10-11 NOTE — Telephone Encounter (Signed)
 Spoke with Kimberly Rodriguez,d/t increased LFTs I rec a abdominal US .  She agreed to proceed.   Please schedule abdominal ultrasound. Dx increased alkaline phosphatase Contact Debria Fang, patient's friend who drives her to her appointments. (The patient has a difficult time hearing over the phone). Call [place] at  [phone]

## 2023-10-11 NOTE — Addendum Note (Signed)
 Addended by: Andrez Lieurance D on: 10/11/2023 12:57 PM   Modules accepted: Orders

## 2023-10-11 NOTE — Telephone Encounter (Signed)
 Order placed, note made to imaging scheduling for them to contact Connie at number given.

## 2023-10-12 ENCOUNTER — Telehealth: Payer: Self-pay

## 2023-10-12 DIAGNOSIS — F4321 Adjustment disorder with depressed mood: Secondary | ICD-10-CM | POA: Diagnosis not present

## 2023-10-12 NOTE — Addendum Note (Signed)
 Addended by: Olufemi Mofield D on: 10/12/2023 10:43 AM   Modules accepted: Orders

## 2023-10-12 NOTE — Telephone Encounter (Signed)
 Copied from CRM [id number]. Topic: Clinical - Medical Advice >> Oct 12, 2023  9:42 AM Adaysia C wrote: Reason for CRM: Maria(patients niece) called to inform the provider that the patients legs are swollen from ankle to knee; Shelagh Derrick asked if the patient needs to come back in the office to be seen by PCP or if the patient will need to see a different specialist; please follow up with Maria(niece)#[phone]

## 2023-10-12 NOTE — Telephone Encounter (Signed)
 Orders faxed as requested.

## 2023-10-12 NOTE — Telephone Encounter (Signed)
 Spoke w/ Shelagh Derrick- informed of recommendations. Shelagh Derrick verbalized understanding. Amlodipine  d/c from med list.

## 2023-10-12 NOTE — Telephone Encounter (Signed)
 Edema was addressed during the last visit.  - I still recommend low-salt diet, leg elevation. - In addition recommend to stop amlodipine  2.5 mg, as that may cause some fluid retention. -Also check BPs at home, call if they are consistently more than 140/85. -Call if swelling is not gradually better in the next 2 weeks

## 2023-10-12 NOTE — Telephone Encounter (Signed)
 Copied from CRM [id number]. Topic: General - Other >> Oct 12, 2023  3:09 PM Lonzell Robin C wrote: Reason for CRM: Juanita from [employer] stated that they need orders where patient is to stop taking BP medication and any other medications that she suppose to stop. Please contact Juanita RN @ [phone] Fax: [phone]

## 2023-10-14 DIAGNOSIS — N182 Chronic kidney disease, stage 2 (mild): Secondary | ICD-10-CM | POA: Diagnosis not present

## 2023-10-14 DIAGNOSIS — I1310 Hypertensive heart and chronic kidney disease without heart failure, with stage 1 through stage 4 chronic kidney disease, or unspecified chronic kidney disease: Secondary | ICD-10-CM | POA: Diagnosis not present

## 2023-10-17 DIAGNOSIS — I1310 Hypertensive heart and chronic kidney disease without heart failure, with stage 1 through stage 4 chronic kidney disease, or unspecified chronic kidney disease: Secondary | ICD-10-CM | POA: Diagnosis not present

## 2023-10-17 DIAGNOSIS — N182 Chronic kidney disease, stage 2 (mild): Secondary | ICD-10-CM | POA: Diagnosis not present

## 2023-10-17 DIAGNOSIS — R600 Localized edema: Secondary | ICD-10-CM | POA: Diagnosis not present

## 2023-10-19 DIAGNOSIS — I69393 Ataxia following cerebral infarction: Secondary | ICD-10-CM | POA: Diagnosis not present

## 2023-10-19 DIAGNOSIS — I10 Essential (primary) hypertension: Secondary | ICD-10-CM | POA: Diagnosis not present

## 2023-10-19 DIAGNOSIS — M150 Primary generalized (osteo)arthritis: Secondary | ICD-10-CM | POA: Diagnosis not present

## 2023-10-19 DIAGNOSIS — M810 Age-related osteoporosis without current pathological fracture: Secondary | ICD-10-CM | POA: Diagnosis not present

## 2023-10-19 DIAGNOSIS — Z87891 Personal history of nicotine dependence: Secondary | ICD-10-CM | POA: Diagnosis not present

## 2023-10-19 DIAGNOSIS — F411 Generalized anxiety disorder: Secondary | ICD-10-CM | POA: Diagnosis not present

## 2023-10-24 ENCOUNTER — Ambulatory Visit (HOSPITAL_BASED_OUTPATIENT_CLINIC_OR_DEPARTMENT_OTHER)
Admission: RE | Admit: 2023-10-24 | Discharge: 2023-10-24 | Disposition: A | Discharge status: Home / Self Care | Source: Ambulatory Visit | Attending: Internal Medicine | Admitting: Internal Medicine

## 2023-10-24 DIAGNOSIS — N281 Cyst of kidney, acquired: Secondary | ICD-10-CM | POA: Diagnosis not present

## 2023-10-24 DIAGNOSIS — R7989 Other specified abnormal findings of blood chemistry: Secondary | ICD-10-CM | POA: Diagnosis not present

## 2023-11-02 ENCOUNTER — Ambulatory Visit: Payer: Self-pay | Admitting: Internal Medicine

## 2023-11-09 DIAGNOSIS — F4321 Adjustment disorder with depressed mood: Secondary | ICD-10-CM | POA: Diagnosis not present

## 2023-11-16 DIAGNOSIS — F4321 Adjustment disorder with depressed mood: Secondary | ICD-10-CM | POA: Diagnosis not present

## 2023-11-16 DIAGNOSIS — E785 Hyperlipidemia, unspecified: Secondary | ICD-10-CM | POA: Diagnosis not present

## 2023-11-16 DIAGNOSIS — G4700 Insomnia, unspecified: Secondary | ICD-10-CM | POA: Diagnosis not present

## 2023-11-17 DIAGNOSIS — G4700 Insomnia, unspecified: Secondary | ICD-10-CM | POA: Diagnosis not present

## 2023-11-17 DIAGNOSIS — K219 Gastro-esophageal reflux disease without esophagitis: Secondary | ICD-10-CM | POA: Diagnosis not present

## 2023-11-17 DIAGNOSIS — E785 Hyperlipidemia, unspecified: Secondary | ICD-10-CM | POA: Diagnosis not present

## 2023-11-21 ENCOUNTER — Telehealth: Payer: Self-pay

## 2023-11-21 ENCOUNTER — Ambulatory Visit (INDEPENDENT_AMBULATORY_CARE_PROVIDER_SITE_OTHER): Admitting: Internal Medicine

## 2023-11-21 VITALS — BP 126/76 | HR 77 | Temp 98.0°F | Resp 16 | Ht 61.0 in | Wt 146.4 lb

## 2023-11-21 DIAGNOSIS — R7989 Other specified abnormal findings of blood chemistry: Secondary | ICD-10-CM | POA: Diagnosis not present

## 2023-11-21 DIAGNOSIS — I10 Essential (primary) hypertension: Secondary | ICD-10-CM

## 2023-11-21 DIAGNOSIS — I6930 Unspecified sequelae of cerebral infarction: Secondary | ICD-10-CM | POA: Diagnosis not present

## 2023-11-21 DIAGNOSIS — Z789 Other specified health status: Secondary | ICD-10-CM

## 2023-11-21 MED ORDER — ASPIRIN 81 MG PO TBEC: 81.0000 mg | DELAYED_RELEASE_TABLET | Freq: Every day | ORAL | Status: AC

## 2023-11-21 NOTE — Progress Notes (Signed)
 Subjective:    Patient ID: Kimberly Rodriguez, female    DOB: September 06, 1926, 89 y.o.   MRN: [id number]  DOS:  11/21/2023 Type of visit - description: Follow-up, here with a friend  Since the last office visit is doing well. Has no major concerns.  Review of Systems See above   Past Medical History:  Diagnosis Date   Anemia    Blood transfusion without reported diagnosis    Cataract    Closed left ankle fracture 01/2019   no surgery   Eustachian tube dysfunction    Right side, Dr. Sulema Endo, ENT   Microhematuria    s/p eval by urology 2008: U/S, CT, cysto w/ neg Bx (dx w/ a renal cyst, see reports)   Osteoarthritis    h/o back pain, sees ortho s/p shots   Osteoporosis     Past Surgical History:  Procedure Laterality Date   ABDOMINAL HYSTERECTOMY     no oophorectomy per pt   APPENDECTOMY     COLONOSCOPY     DILATION AND CURETTAGE OF UTERUS     TONSILLECTOMY      Current Outpatient Medications  Medication Instructions   Calcium Carbonate (CALTRATE 600 PO) 1 tablet, Daily   cholecalciferol (VITAMIN D3) 1,000 Units, Daily   Docusate Calcium (STOOL SOFTENER PO) 1 capsule, Daily   FIBER PO 2 tablets, Daily   fluticasone (FLONASE) 50 MCG/ACT nasal spray 2 sprays, Daily PRN   Multiple Vitamins-Minerals (CENTRUM SILVER) CHEW Chew by mouth.   multivitamin-lutein (OCUVITE-LUTEIN) CAPS capsule 1 capsule, Daily   pantoprazole  (PROTONIX ) 20 mg, Oral, Daily before breakfast   Prolia  60 mg, Subcutaneous, Every 6 months, Dx code: M81.0       Objective:   Physical Exam BP 126/76   Pulse 77   Temp 98 F (36.7 C) (Oral)   Resp 16   Ht 5\' 1"  (1.549 m)   Wt 146 lb 6 oz (66.4 kg)   SpO2 96%   BMI 27.66 kg/m  General:   Well developed, NAD, BMI noted. HEENT:  Normocephalic . Face symmetric, atraumatic.  HOH Lungs:  CTA B Normal respiratory effort, no intercostal retractions, no accessory muscle use. Heart: RRR, + systolic murmur.  Lower extremities: trace pretibial edema  bilaterally  Skin: Not pale. Not jaundice Neurologic:  alert & oriented X3.  Speech normal, gait appropriate for age and unassisted Psych--  Cooperative, slightly decreased attention span. Behavior appropriate. No anxious or depressed appearing.      Assessment    Assessment HTN Interstitial lung dz per chest x-ray 08-2014, PFTs 08-2014: minimal obstruction MSK: --DJD --Neck, back  pain Osteoporosis:  T score 04-2015 -3.1, prolia  #2  11-2015  T score -3.4 (10/2016), saw Dr. Washington Hacker, work-up for secondary etiologies negative, rx Prolia  03/2018 Microhematuria  s/p eval by urology 2008: U/S, CT, cysto w/ neg Bx (dx w/ a renal cyst, see reports) ET dysfuntion saw ENT Dr Sulema Endo 2016 Anemia, saw GI: 06-2016: Cscope: polyps, tubular adenoma; EGD: cameron lesions, no Bx Murmur: Echo 09/2019: Mild to moderate MR and AS;  03/2022: normal EF, mild MV R, moderate AoS, see report   PLAN Increase alkaline phosphatase Alkaline phosphatase was 207.  Normal AST, ALT slightly up at 38.  GGT 85. Liver US  negative. Advised patient that we could continue doing a workup to be sure is nothing serious is elevated in the AP but we both agreed that at this point given comorbidities and age the best option is observation High cholesterol: Last LDL  62, was supposed to be on atorvastatin 80 mg daily but that medication is not on her list, I asked her to let me know if she is taking it. Addendum: She is on atorvastatin. Edema: Since the last visit, patient continued to be concerned about leg swelling so was rec to stop amlodipine  2.5 mg and monitor her BPs.  She is not aware of any BPs done recently.  BP today is okay today. HTN: See above, amlodipine  stopped, BP satisfactory, recommend to monitor twice weekly. History of stroke Saw neurology 10/11/2023, they rec  aspirin-atorvastatin.  Also a Zio patch.  We agree on restart aspirin.   RTC 3 to 4 months   Time spent 30 minutes, extensive discussion about elevated  AP workup vs observation. Also chart reviewed regards taking atorvastatin and aspirin.

## 2023-11-21 NOTE — Telephone Encounter (Signed)
 FYI- I have added atorvastatin back to the med list.

## 2023-11-21 NOTE — Patient Instructions (Addendum)
 Take a baby aspirin 81 mg once a day   You are supposed to be on a cholesterol medication: atorvastatin 80 mg a day. Let me know if you are taking that medicine   Check the  blood pressure twice a week  Blood pressure goal:  between 110/65 and  135/85. If it is consistently higher or lower, let me know   Next office visit for a check up in 3-4 months  Please make an appointment before you leave today

## 2023-11-21 NOTE — Telephone Encounter (Signed)
 Copied from CRM [id number]. Topic: Clinical - Medication Question >> Nov 21, 2023  4:16 PM Juleen Oakland F wrote: Reason for CRM: Patient friend Debria Fang wants to let Dr. Neomi Banks know that patient is taking the Atorvastatin 80mg 

## 2023-11-21 NOTE — Telephone Encounter (Signed)
 Noted thank you

## 2023-11-22 ENCOUNTER — Other Ambulatory Visit: Payer: Self-pay

## 2023-11-22 DIAGNOSIS — H35721 Serous detachment of retinal pigment epithelium, right eye: Secondary | ICD-10-CM | POA: Diagnosis not present

## 2023-11-22 DIAGNOSIS — H35363 Drusen (degenerative) of macula, bilateral: Secondary | ICD-10-CM | POA: Diagnosis not present

## 2023-11-22 DIAGNOSIS — H3581 Retinal edema: Secondary | ICD-10-CM | POA: Diagnosis not present

## 2023-11-22 DIAGNOSIS — H353231 Exudative age-related macular degeneration, bilateral, with active choroidal neovascularization: Secondary | ICD-10-CM | POA: Diagnosis not present

## 2023-11-22 NOTE — Assessment & Plan Note (Signed)
 Increase alkaline phosphatase Alkaline phosphatase was 207.  Normal AST, ALT slightly up at 38.  GGT 85. Liver US  negative. Advised patient that we could continue doing a workup to be sure is nothing serious is elevated in the AP but we both agreed that at this point given comorbidities and age the best option is observation High cholesterol: Last LDL 62, was supposed to be on atorvastatin 80 mg daily but that medication is not on her list, I asked her to let me know if she is taking it. Addendum: She is on atorvastatin. Edema: Since the last visit, patient continued to be concerned about leg swelling so was rec to stop amlodipine  2.5 mg and monitor her BPs.  She is not aware of any BPs done recently.  BP today is okay today. HTN: See above, amlodipine  stopped, BP satisfactory, recommend to monitor twice weekly. History of stroke Saw neurology 10/11/2023, they rec  aspirin-atorvastatin.  Also a Zio patch.  We agree on restart aspirin.   RTC 3 to 4 months

## 2023-11-29 DIAGNOSIS — L600 Ingrowing nail: Secondary | ICD-10-CM | POA: Diagnosis not present

## 2023-11-29 DIAGNOSIS — R238 Other skin changes: Secondary | ICD-10-CM | POA: Diagnosis not present

## 2023-11-29 DIAGNOSIS — B351 Tinea unguium: Secondary | ICD-10-CM | POA: Diagnosis not present

## 2023-11-29 DIAGNOSIS — L989 Disorder of the skin and subcutaneous tissue, unspecified: Secondary | ICD-10-CM | POA: Diagnosis not present

## 2023-11-29 DIAGNOSIS — I872 Venous insufficiency (chronic) (peripheral): Secondary | ICD-10-CM | POA: Diagnosis not present

## 2023-11-29 DIAGNOSIS — R601 Generalized edema: Secondary | ICD-10-CM | POA: Diagnosis not present

## 2023-11-29 DIAGNOSIS — I739 Peripheral vascular disease, unspecified: Secondary | ICD-10-CM | POA: Diagnosis not present

## 2023-11-29 DIAGNOSIS — R262 Difficulty in walking, not elsewhere classified: Secondary | ICD-10-CM | POA: Diagnosis not present

## 2023-11-29 DIAGNOSIS — M6281 Muscle weakness (generalized): Secondary | ICD-10-CM | POA: Diagnosis not present

## 2023-11-29 DIAGNOSIS — R2689 Other abnormalities of gait and mobility: Secondary | ICD-10-CM | POA: Diagnosis not present

## 2023-12-13 ENCOUNTER — Encounter: Payer: Self-pay | Admitting: Pharmacist

## 2023-12-13 ENCOUNTER — Other Ambulatory Visit: Payer: Self-pay | Admitting: Pharmacist

## 2023-12-13 NOTE — Progress Notes (Signed)
 Pharmacy Quality Measure Review  This patient is appearing on a report for being at risk of failing the adherence measure for cholesterol (statin) medications this calendar year.   Medication: atorvastatin  Last fill date: 04/18/205 for 28 day supply per adherence report  Reviewed recent refill history - patient received 28 day supply from facility's pharmacy - [facility] on 05/16 and 06/13  Insurance report was not up to date. No action needed at this time.   Madelin Ray, PharmD Clinical Pharmacist [facility] Primary [facility]  Population Health [phone]

## 2023-12-13 NOTE — Telephone Encounter (Signed)
 Opened in error

## 2023-12-14 DIAGNOSIS — R4181 Age-related cognitive decline: Secondary | ICD-10-CM | POA: Diagnosis not present

## 2023-12-14 DIAGNOSIS — G4700 Insomnia, unspecified: Secondary | ICD-10-CM | POA: Diagnosis not present

## 2023-12-15 DIAGNOSIS — M810 Age-related osteoporosis without current pathological fracture: Secondary | ICD-10-CM | POA: Diagnosis not present

## 2023-12-15 DIAGNOSIS — E785 Hyperlipidemia, unspecified: Secondary | ICD-10-CM | POA: Diagnosis not present

## 2023-12-15 DIAGNOSIS — K5900 Constipation, unspecified: Secondary | ICD-10-CM | POA: Diagnosis not present

## 2023-12-15 DIAGNOSIS — K219 Gastro-esophageal reflux disease without esophagitis: Secondary | ICD-10-CM | POA: Diagnosis not present

## 2023-12-19 DIAGNOSIS — H3554 Dystrophies primarily involving the retinal pigment epithelium: Secondary | ICD-10-CM | POA: Diagnosis not present

## 2023-12-19 DIAGNOSIS — H25012 Cortical age-related cataract, left eye: Secondary | ICD-10-CM | POA: Diagnosis not present

## 2023-12-19 DIAGNOSIS — H2512 Age-related nuclear cataract, left eye: Secondary | ICD-10-CM | POA: Diagnosis not present

## 2023-12-19 DIAGNOSIS — H524 Presbyopia: Secondary | ICD-10-CM | POA: Diagnosis not present

## 2023-12-19 DIAGNOSIS — H18513 Endothelial corneal dystrophy, bilateral: Secondary | ICD-10-CM | POA: Diagnosis not present

## 2023-12-19 DIAGNOSIS — H52203 Unspecified astigmatism, bilateral: Secondary | ICD-10-CM | POA: Diagnosis not present

## 2023-12-19 DIAGNOSIS — H43813 Vitreous degeneration, bilateral: Secondary | ICD-10-CM | POA: Diagnosis not present

## 2023-12-19 DIAGNOSIS — H25042 Posterior subcapsular polar age-related cataract, left eye: Secondary | ICD-10-CM | POA: Diagnosis not present

## 2023-12-19 DIAGNOSIS — H5203 Hypermetropia, bilateral: Secondary | ICD-10-CM | POA: Diagnosis not present

## 2023-12-19 DIAGNOSIS — H353231 Exudative age-related macular degeneration, bilateral, with active choroidal neovascularization: Secondary | ICD-10-CM | POA: Diagnosis not present

## 2023-12-19 DIAGNOSIS — Z961 Presence of intraocular lens: Secondary | ICD-10-CM | POA: Diagnosis not present

## 2024-01-18 ENCOUNTER — Encounter: Payer: Self-pay | Admitting: Pharmacist

## 2024-01-18 DIAGNOSIS — N182 Chronic kidney disease, stage 2 (mild): Secondary | ICD-10-CM | POA: Diagnosis not present

## 2024-01-18 DIAGNOSIS — E785 Hyperlipidemia, unspecified: Secondary | ICD-10-CM | POA: Diagnosis not present

## 2024-01-18 NOTE — Progress Notes (Signed)
 Pharmacy Quality Measure Review  This patient is appearing on a report for being at risk of failing the adherence measure for cholesterol (statin) medications this calendar year.   Medication: atorvastatin  Last fill date: 11/04/2023 for 28 day supply per adherence report  Reviewed recent refill history - patient received 28 day supply from facility's pharmacy - [facility] on 12/02/2023 and 12/30/2023  Insurance report was not up to date. No action needed at this time.   Madelin Ray, PharmD Clinical Pharmacist [facility] Primary [facility]  Population Health [phone]

## 2024-01-19 DIAGNOSIS — E785 Hyperlipidemia, unspecified: Secondary | ICD-10-CM | POA: Diagnosis not present

## 2024-01-19 DIAGNOSIS — E46 Unspecified protein-calorie malnutrition: Secondary | ICD-10-CM | POA: Diagnosis not present

## 2024-01-19 DIAGNOSIS — G4700 Insomnia, unspecified: Secondary | ICD-10-CM | POA: Diagnosis not present

## 2024-01-24 DIAGNOSIS — E785 Hyperlipidemia, unspecified: Secondary | ICD-10-CM | POA: Diagnosis not present

## 2024-01-24 DIAGNOSIS — I10 Essential (primary) hypertension: Secondary | ICD-10-CM | POA: Diagnosis not present

## 2024-01-24 DIAGNOSIS — Z1329 Encounter for screening for other suspected endocrine disorder: Secondary | ICD-10-CM | POA: Diagnosis not present

## 2024-01-24 DIAGNOSIS — Z131 Encounter for screening for diabetes mellitus: Secondary | ICD-10-CM | POA: Diagnosis not present

## 2024-01-31 DIAGNOSIS — L989 Disorder of the skin and subcutaneous tissue, unspecified: Secondary | ICD-10-CM | POA: Diagnosis not present

## 2024-01-31 DIAGNOSIS — I739 Peripheral vascular disease, unspecified: Secondary | ICD-10-CM | POA: Diagnosis not present

## 2024-01-31 DIAGNOSIS — R2689 Other abnormalities of gait and mobility: Secondary | ICD-10-CM | POA: Diagnosis not present

## 2024-01-31 DIAGNOSIS — M6281 Muscle weakness (generalized): Secondary | ICD-10-CM | POA: Diagnosis not present

## 2024-01-31 DIAGNOSIS — R601 Generalized edema: Secondary | ICD-10-CM | POA: Diagnosis not present

## 2024-01-31 DIAGNOSIS — I872 Venous insufficiency (chronic) (peripheral): Secondary | ICD-10-CM | POA: Diagnosis not present

## 2024-01-31 DIAGNOSIS — R238 Other skin changes: Secondary | ICD-10-CM | POA: Diagnosis not present

## 2024-01-31 DIAGNOSIS — R262 Difficulty in walking, not elsewhere classified: Secondary | ICD-10-CM | POA: Diagnosis not present

## 2024-01-31 DIAGNOSIS — B351 Tinea unguium: Secondary | ICD-10-CM | POA: Diagnosis not present

## 2024-02-13 ENCOUNTER — Telehealth: Payer: Self-pay

## 2024-02-13 NOTE — Telephone Encounter (Signed)
 Prolia  VOB initiated via [email or web address]  Next Prolia  inj DUE: 03/15/24

## 2024-02-14 ENCOUNTER — Other Ambulatory Visit (HOSPITAL_COMMUNITY): Payer: Self-pay

## 2024-02-14 DIAGNOSIS — H353221 Exudative age-related macular degeneration, left eye, with active choroidal neovascularization: Secondary | ICD-10-CM | POA: Diagnosis not present

## 2024-02-14 DIAGNOSIS — H353231 Exudative age-related macular degeneration, bilateral, with active choroidal neovascularization: Secondary | ICD-10-CM | POA: Diagnosis not present

## 2024-02-14 DIAGNOSIS — H35363 Drusen (degenerative) of macula, bilateral: Secondary | ICD-10-CM | POA: Diagnosis not present

## 2024-02-14 NOTE — Telephone Encounter (Signed)
 Pt ready for scheduling for PROLIA  on or after : 03/15/24  Option# 1: Buy/Bill (Office supplied medication)  Out-of-pocket cost due at time of clinic visit: $332  Number of injection/visits approved: ---  Primary: [employer] Prolia  co-insurance: 20% Admin fee co-insurance: 0%  Secondary: --- Prolia  co-insurance:  Admin fee co-insurance:   Medical Benefit Details: Date Benefits were checked: 02/13/24 Deductible: NO/ Coinsurance: 20%/ Admin Fee: 0%  Prior Auth: N/A PA# Expiration Date:   # of doses approved: ----------------------------------------------------------------------- Option# 2- Med Obtained from pharmacy:  Pharmacy benefit: Copay [phone] (Paid to pharmacy) Admin Fee: 0% (Pay at clinic)  Prior Auth: N/A PA# Expiration Date:   # of doses approved:   If patient wants fill through the pharmacy benefit please send prescription to: [facility], and include estimated need by date in rx notes. Pharmacy will ship medication directly to the office.  Patient NOT eligible for Prolia  Copay Card. Copay Card can make patient's cost as little as $25. Link to apply: [email or web address]://[email or web address]  ** This summary of benefits is an estimation of the patient's out-of-pocket cost. Exact cost may very based on individual plan coverage.

## 2024-02-16 DIAGNOSIS — E7800 Pure hypercholesterolemia, unspecified: Secondary | ICD-10-CM | POA: Diagnosis not present

## 2024-02-16 DIAGNOSIS — K219 Gastro-esophageal reflux disease without esophagitis: Secondary | ICD-10-CM | POA: Diagnosis not present

## 2024-02-16 DIAGNOSIS — E785 Hyperlipidemia, unspecified: Secondary | ICD-10-CM | POA: Diagnosis not present

## 2024-02-16 DIAGNOSIS — G4700 Insomnia, unspecified: Secondary | ICD-10-CM | POA: Diagnosis not present

## 2024-02-16 DIAGNOSIS — I10 Essential (primary) hypertension: Secondary | ICD-10-CM | POA: Diagnosis not present

## 2024-02-21 ENCOUNTER — Encounter: Payer: Self-pay | Admitting: Internal Medicine

## 2024-02-21 ENCOUNTER — Ambulatory Visit (INDEPENDENT_AMBULATORY_CARE_PROVIDER_SITE_OTHER): Admitting: Internal Medicine

## 2024-02-21 VITALS — BP 124/82 | HR 51 | Temp 98.0°F | Resp 18 | Ht 61.0 in | Wt 151.1 lb

## 2024-02-21 DIAGNOSIS — I350 Nonrheumatic aortic (valve) stenosis: Secondary | ICD-10-CM | POA: Diagnosis not present

## 2024-02-21 DIAGNOSIS — I6930 Unspecified sequelae of cerebral infarction: Secondary | ICD-10-CM | POA: Diagnosis not present

## 2024-02-21 DIAGNOSIS — M810 Age-related osteoporosis without current pathological fracture: Secondary | ICD-10-CM | POA: Diagnosis not present

## 2024-02-21 DIAGNOSIS — E7800 Pure hypercholesterolemia, unspecified: Secondary | ICD-10-CM

## 2024-02-21 DIAGNOSIS — Z789 Other specified health status: Secondary | ICD-10-CM | POA: Diagnosis not present

## 2024-02-21 NOTE — Assessment & Plan Note (Signed)
 HTN: On no medications currently, BP looks good today. High cholesterol:On atorvastatin, last LDL 62, check AST ALT Stroke sequela: Gait assisted by a cane, lives at a ALF. MCI?  She is alert oriented x 3, her friend who is here, reports that she occasionally gets confused if she is stressed. Heart murmur: Last echo 2023, echo was rec 06/2023 during  stroke admission, not done to my knowledge. Osteoporosis: Check BMP in preparation for Prolia  Vaccine advice: Patient believes she gets vaccines at the ALF. RTC 4 months

## 2024-02-21 NOTE — Progress Notes (Signed)
 Subjective:    Patient ID: Kimberly Rodriguez, female    DOB: December 27, 1926, 89 y.o.   MRN: [id number]  DOS:  02/21/2024 Type of visit - description: Follow-up, here with Jori  Patient reports since LOV  things are stable. No falls. Appetite is good. Occasionally has constipation which she manages with prune juice.   Review of Systems See above   Past Medical History:  Diagnosis Date   Anemia    Blood transfusion without reported diagnosis    Cataract    Closed left ankle fracture 01/2019   no surgery   Eustachian tube dysfunction    Right side, Dr. Georgina, ENT   Microhematuria    s/p eval by urology 2008: U/S, CT, cysto w/ neg Bx (dx w/ a renal cyst, see reports)   Osteoarthritis    h/o back pain, sees ortho s/p shots   Osteoporosis     Past Surgical History:  Procedure Laterality Date   ABDOMINAL HYSTERECTOMY     no oophorectomy per pt   APPENDECTOMY     COLONOSCOPY     DILATION AND CURETTAGE OF UTERUS     TONSILLECTOMY      Current Outpatient Medications  Medication Instructions   aspirin  EC 81 mg, Oral, Daily, Swallow whole.   atorvastatin (LIPITOR) 80 mg, Daily at bedtime   Calcium Carbonate (CALTRATE 600 PO) 1 tablet, Daily   cholecalciferol (VITAMIN D3) 1,000 Units, Daily   Docusate Calcium (STOOL SOFTENER PO) 1 capsule, Daily   FIBER PO 2 tablets, Daily   fluticasone (FLONASE) 50 MCG/ACT nasal spray 2 sprays, Daily PRN   Melatonin 3 mg, Daily at bedtime   Multiple Vitamins-Minerals (CENTRUM SILVER) CHEW Chew by mouth.   multivitamin-lutein (OCUVITE-LUTEIN) CAPS capsule 1 capsule, Daily   pantoprazole  (PROTONIX ) 20 mg, Oral, Daily before breakfast   Prolia  60 mg, Subcutaneous, Every 6 months, Dx code: M81.0   sodium chloride  (OCEAN) 0.65 % SOLN nasal spray 1 spray, Daily PRN       Objective:   Physical Exam BP 124/82   Pulse (!) 51   Temp 98 F (36.7 C) (Oral)   Resp 18   Ht 5' 1 (1.549 m)   Wt 151 lb 2 oz (68.5 kg)   SpO2 96%   BMI 28.55 kg/m   General:   Well developed, NAD, BMI noted. HEENT:  Normocephalic . Face symmetric, atraumatic.  HOH Lungs:  CTA B Normal respiratory effort, no intercostal retractions, no accessory muscle use. Heart: RRR, + systolic murmur, 3/5.  Lower extremities: no pretibial edema bilaterally  Skin: Not pale. Not jaundice Neurologic:  alert & oriented X3.  Speech normal, gait assisted by cane Psych--  Cognition and judgment appear intact.  Cooperative with normal attention span and concentration.  Behavior appropriate. No anxious or depressed appearing.      Assessment   Assessment HTN High cholesterol R MCA territory stroke, hemorrhagic conversion (06/2023) Murmur:  --Echo 09/2019: Mild to moderate MR and AS;   --Echo 03/2022: normal EF, mild MV R, moderate Ao stenosis and regurgitation  Interstitial lung dz per chest x-ray 08-2014, PFTs 08-2014: minimal obstruction MSK: --DJD --Neck, back  pain Osteoporosis:  T score 04-2015 -3.1, prolia  #2  11-2015  T score -3.4 (10/2016), saw Dr. Kassie, work-up for secondary etiologies negative, rx Prolia  03/2018 Microhematuria  s/p eval by urology 2008: U/S, CT, cysto w/ neg Bx (dx w/ a renal cyst, see reports) ET dysfuntion saw ENT Dr Georgina 2016 Anemia, saw GI: 06-2016: Cscope:  polyps, tubular adenoma; EGD: cameron lesions, no Bx    PLAN HTN: On no medications currently, BP looks good today. High cholesterol:On atorvastatin, last LDL 62, check AST ALT Stroke sequela: Gait assisted by a cane, lives at a ALF. MCI?  She is alert oriented x 3, her friend who is here, reports that she occasionally gets confused if she is stressed. Heart murmur: Last echo 2023, echo was rec 06/2023 during  stroke admission, not done to my knowledge. Osteoporosis: Check BMP in preparation for Prolia  Vaccine advice: Patient believes she gets vaccines at the ALF. RTC 4 months

## 2024-02-21 NOTE — Patient Instructions (Signed)
 Vaccines are recommended: Flu shot COVID-vaccine RSV if not done   Check the  blood pressure regularly Blood pressure goal:  between 110/65 and  135/85. If it is consistently higher or lower, let me know     GO TO THE LAB :  Get the blood work   Your results will be posted on MyChart with my comments  Go to the front desk for the checkout Please make an appointment for a follow-up in 4 to 5 months

## 2024-02-22 ENCOUNTER — Ambulatory Visit: Payer: Self-pay | Admitting: Internal Medicine

## 2024-02-22 DIAGNOSIS — Z79899 Other long term (current) drug therapy: Secondary | ICD-10-CM | POA: Diagnosis not present

## 2024-02-22 DIAGNOSIS — K219 Gastro-esophageal reflux disease without esophagitis: Secondary | ICD-10-CM | POA: Diagnosis not present

## 2024-02-22 DIAGNOSIS — G4700 Insomnia, unspecified: Secondary | ICD-10-CM | POA: Diagnosis not present

## 2024-02-22 DIAGNOSIS — E7800 Pure hypercholesterolemia, unspecified: Secondary | ICD-10-CM | POA: Diagnosis not present

## 2024-02-22 LAB — BASIC METABOLIC PANEL WITH GFR
BUN: 19 mg/dL (ref 6–23)
CO2: 32 meq/L (ref 19–32)
Calcium: 9.3 mg/dL (ref 8.4–10.5)
Chloride: 102 meq/L (ref 96–112)
Creatinine, Ser: 0.78 mg/dL (ref 0.40–1.20)
GFR: 64.75 mL/min (ref >60.00)
Glucose, Bld: 97 mg/dL (ref 70–99)
Potassium: 4.5 meq/L (ref 3.5–5.1)
Sodium: 142 meq/L (ref 135–145)

## 2024-02-22 LAB — ALT: ALT: 28 U/L (ref 0–35)

## 2024-02-22 LAB — AST: AST: 24 U/L (ref 0–37)

## 2024-02-23 ENCOUNTER — Other Ambulatory Visit (HOSPITAL_COMMUNITY): Payer: Self-pay

## 2024-03-06 ENCOUNTER — Other Ambulatory Visit (HOSPITAL_COMMUNITY): Payer: Self-pay

## 2024-03-15 DIAGNOSIS — G4700 Insomnia, unspecified: Secondary | ICD-10-CM | POA: Diagnosis not present

## 2024-03-15 DIAGNOSIS — K219 Gastro-esophageal reflux disease without esophagitis: Secondary | ICD-10-CM | POA: Diagnosis not present

## 2024-03-15 DIAGNOSIS — K5900 Constipation, unspecified: Secondary | ICD-10-CM | POA: Diagnosis not present

## 2024-03-19 ENCOUNTER — Ambulatory Visit

## 2024-03-20 DIAGNOSIS — H5703 Miosis: Secondary | ICD-10-CM | POA: Diagnosis not present

## 2024-03-20 DIAGNOSIS — E7800 Pure hypercholesterolemia, unspecified: Secondary | ICD-10-CM | POA: Diagnosis not present

## 2024-03-20 DIAGNOSIS — H2522 Age-related cataract, morgagnian type, left eye: Secondary | ICD-10-CM | POA: Diagnosis not present

## 2024-03-20 DIAGNOSIS — I10 Essential (primary) hypertension: Secondary | ICD-10-CM | POA: Diagnosis not present

## 2024-03-29 ENCOUNTER — Ambulatory Visit (INDEPENDENT_AMBULATORY_CARE_PROVIDER_SITE_OTHER): Admitting: *Deleted

## 2024-03-29 DIAGNOSIS — M810 Age-related osteoporosis without current pathological fracture: Secondary | ICD-10-CM | POA: Diagnosis not present

## 2024-03-29 MED ORDER — DENOSUMAB 60 MG/ML ~~LOC~~ SOSY: 60.0000 mg | PREFILLED_SYRINGE | SUBCUTANEOUS | Status: AC

## 2024-03-29 NOTE — Progress Notes (Signed)
 Patient here for Prolia  injection per physicians orders  Prolia  60 mg SQ , was administered left arm today. Patient tolerated injection.  Patient next injection due: 6 months, appt made:  No- will schedule in 5 months after benefits are ran again  Initial injection: no  Did Prolia  come from pharmacy (if yes please select patient supplied): no  Cam placed for next injection: yes

## 2024-04-03 DIAGNOSIS — L989 Disorder of the skin and subcutaneous tissue, unspecified: Secondary | ICD-10-CM | POA: Diagnosis not present

## 2024-04-03 DIAGNOSIS — R238 Other skin changes: Secondary | ICD-10-CM | POA: Diagnosis not present

## 2024-04-03 DIAGNOSIS — L600 Ingrowing nail: Secondary | ICD-10-CM | POA: Diagnosis not present

## 2024-04-03 DIAGNOSIS — M6281 Muscle weakness (generalized): Secondary | ICD-10-CM | POA: Diagnosis not present

## 2024-04-03 DIAGNOSIS — I872 Venous insufficiency (chronic) (peripheral): Secondary | ICD-10-CM | POA: Diagnosis not present

## 2024-04-03 DIAGNOSIS — R262 Difficulty in walking, not elsewhere classified: Secondary | ICD-10-CM | POA: Diagnosis not present

## 2024-04-03 DIAGNOSIS — I739 Peripheral vascular disease, unspecified: Secondary | ICD-10-CM | POA: Diagnosis not present

## 2024-04-03 DIAGNOSIS — R2689 Other abnormalities of gait and mobility: Secondary | ICD-10-CM | POA: Diagnosis not present

## 2024-04-03 DIAGNOSIS — R601 Generalized edema: Secondary | ICD-10-CM | POA: Diagnosis not present

## 2024-04-03 DIAGNOSIS — B351 Tinea unguium: Secondary | ICD-10-CM | POA: Diagnosis not present

## 2024-04-06 DIAGNOSIS — L0103 Bullous impetigo: Secondary | ICD-10-CM | POA: Diagnosis not present

## 2024-04-10 DIAGNOSIS — S32010A Wedge compression fracture of first lumbar vertebra, initial encounter for closed fracture: Secondary | ICD-10-CM | POA: Diagnosis not present

## 2024-04-10 DIAGNOSIS — I672 Cerebral atherosclerosis: Secondary | ICD-10-CM | POA: Diagnosis not present

## 2024-04-10 DIAGNOSIS — S32028A Other fracture of second lumbar vertebra, initial encounter for closed fracture: Secondary | ICD-10-CM | POA: Diagnosis not present

## 2024-04-10 DIAGNOSIS — N83201 Unspecified ovarian cyst, right side: Secondary | ICD-10-CM | POA: Diagnosis not present

## 2024-04-10 DIAGNOSIS — M25552 Pain in left hip: Secondary | ICD-10-CM | POA: Diagnosis not present

## 2024-04-10 DIAGNOSIS — S32018A Other fracture of first lumbar vertebra, initial encounter for closed fracture: Secondary | ICD-10-CM | POA: Diagnosis not present

## 2024-04-10 DIAGNOSIS — M47812 Spondylosis without myelopathy or radiculopathy, cervical region: Secondary | ICD-10-CM | POA: Diagnosis not present

## 2024-04-11 ENCOUNTER — Ambulatory Visit: Payer: Self-pay

## 2024-04-11 NOTE — Telephone Encounter (Signed)
 FYI Only or Action Required?: FYI only for provider.  Patient was last seen in primary care on 02/21/2024 by Amon Aloysius BRAVO, MD.  Called Nurse Triage reporting Fall.  Symptoms began yesterday.  Interventions attempted: OTC medications: Tylenol, ibuprofen.  Symptoms are: gradually worsening.  Triage Disposition: See PCP Within 2 Weeks  Patient/caregiver understands and will follow disposition?: Yes  Copied from CRM [id number]. Topic: Clinical - Red Word Triage >> Apr 11, 2024 11:31 AM Arm[place] J wrote: [facility] that prompted transfer to Nurse Triage: Patient needs to schedule a hospital follow-up due to a fall that happened at assisted living Monday night. Patient is still in a tremendous amount of pain even after being seen at the hospital. Reason for Disposition  [1] Fall AND [2] went to emergency department for evaluation or treatment  Answer Assessment - Initial Assessment Questions Fall 1130pm Monday night. Fell backward and hit her head and landed on her left buttocks. Lives in assisted living. Transported to the ED ([facility]) and discharged yesterday 10/21. Patient in a ton of pain on her buttocks from the fall. She was advised to take Ibuprofen and tylenol with no relief. She doesn't like ice and has refused from care support.   Alyse Bale states that she seems a little delirious since discharge. She got almost no sleep while in ED. Pt has called her 3times about being in pain. Friend endorses that she has high health anxiety and does not tolerate being in pain well.   Advised if patient were to become confused, severe headache, speech or vision changes, weakness on one side, or seizures to go straight to ED. Appt made for tomorrow.   1. MECHANISM: How did the fall happen?     Clemens backwards grabbing something from her closet 3. ONSET: When did the fall happen? (e.g., minutes, hours, or days ago)     Monday night 4. LOCATION: What part of the body hit the ground? (e.g., back,  buttocks, head, hips, knees, hands, head, stomach)     Head, and buttocks 5. INJURY: Did you hurt (injure) yourself when you fell? If Yes, ask: What did you injure? Tell me more about this? (e.g., body area; type of injury; pain severity)     Buttock pain  6. PAIN: Is there any pain? If Yes, ask: How bad is the pain? (e.g., Scale 0-10; or none, mild,      10/10 7. SIZE: For cuts, bruises, or swelling, ask: How large is it? (e.g., inches or centimeters)      denies 9. OTHER SYMPTOMS: Do you have any other symptoms? (e.g., dizziness, fever, weakness; new-onset or worsening).      Pain in buttocks 10. CAUSE: What do you think caused the fall (or falling)? (e.g., dizzy spell, tripped)       Lost balance  Protocols used: Falls and [facility]

## 2024-04-11 NOTE — Telephone Encounter (Signed)
 Appt scheduled

## 2024-04-11 NOTE — Progress Notes (Signed)
 [facility] at [facility] [address] [place], [place] [address] [phone]  Date:  04/12/2024   Name:  Kimberly Rodriguez   DOB:  12/11/1926   MRN:  [id number]  PCP:  Amon Aloysius BRAVO, MD    Chief Complaint: No chief complaint on file.   History of Present Illness:  Kimberly Rodriguez is a 89 y.o. very pleasant female patient who presents with the following:  Primary patient of my partner Dr. Amon is seen today with concern of recent fall She has history of osteoarthritis, aortic stenosis, macular degeneration, hypertension, osteoporosis She was seen in the ER on 10/21 at [facility]: Kimberly Rodriguez is a 89 y.o. female who presents with mechanical fall. The patient reports she was placing an object in her closet when she lost her balance. The patient states she fell and landed on her left hip. The patient reports she struck her head. The patient denies loss of consciousness. The patient does not take any blood thinning medication, no anticoagulants. The patient complains of only left buttock and left hip pain.  1. Fall, initial encounter Active  2. Closed compression fracture of L1 vertebra, initial encounter Active  3. Compression fracture of L2 vertebra, initial encounter Active  4. Anterolisthesis of lumbosacral spine Active  5. Right ovarian cyst Active   Medical Decision Making 89 year old female presents with fall. Fall was from standing and considered a low energy mechanism. Differential diagnosis includes subdural hematoma, epidural hematoma, skull fracture, cervical spine fracture or dislocation, concussion (minor TBI), rib fracture, pneumothorax, intraabdominal trauma including spleen or liver laceration, upper extremity trauma including humerus fracture or shoulder fracture or shoulder dislocation, pelvis fracture, hip fracture or dislocation, other extremity injuries. CT head and cervical spine considered and were  ordered. Workup here shows an L1 and L2 compression fracture without retropulsion and anterior listhesis. The patient also has incidental right ovarian cyst. These results were all discussed with the patient. OB/GYN follow-up for ovarian cyst. Dr. Wallene, Norleen the spine surgeon on-call was consulted regarding the spine fractures and anterolisthesis. He recommends Cash brace from [employer] which I have ordered. I have referred the patient to spine surgery and OB/GYN. The patient is pending [employer] application of Cash brace and the patient ambulating in the brace in the ED with a cane. The patient was signed out to Dr. Zackowski at 4:42 AM at change of shift.  -----------------------------------------------------------  Discussed the use of AI scribe software for clinical note transcription with the patient, who gave verbal consent to proceed.  History of Present Illness       Patient Active Problem List   Diagnosis Date Noted   Aortic stenosis 02/21/2024   Hemorrhagic stroke (HCC) 07/01/2023   Exudative age-related macular degeneration of both eyes with active choroidal neovascularization (HCC) 01/01/2021   Hypertension, DX 08-2020 09/04/2020   Closed left ankle fracture 01/2019   PCP NOTES >>>>>>>>>>>>>>>>>>>>> 04/01/2015   Lung disease--diffuse interstitial lung disease by x-ray 08-2014 09/05/2014   Anxiety 02/29/2012   Annual physical exam 12/11/2010   DJD (degenerative joint disease) 10/24/2009   Osteoporosis 06/05/2009    Past Medical History:  Diagnosis Date   Anemia    Blood transfusion without reported diagnosis    Cataract    Closed left ankle fracture 01/2019   no surgery   Eustachian tube dysfunction    Right side, Dr. Georgina, ENT   Microhematuria    s/p  eval by urology 2008: U/S, CT, cysto w/ neg Bx (dx w/ a renal cyst, see reports)   Osteoarthritis    h/o back pain, sees ortho s/p shots   Osteoporosis     Past Surgical History:  Procedure Laterality Date    ABDOMINAL HYSTERECTOMY     no oophorectomy per pt   APPENDECTOMY     COLONOSCOPY     DILATION AND CURETTAGE OF UTERUS     TONSILLECTOMY      Social History   Tobacco Use   Smoking status: Former   Smokeless tobacco: Never  Substance Use Topics   Alcohol use: Yes    Comment: socially -wine   Drug use: No    Family History  Problem Relation Age of Onset   Heart disease Father    Prostate cancer Father    Heart disease Mother    Colon cancer Neg Hx    Breast cancer Neg Hx    Diabetes Neg Hx    Osteoporosis Neg Hx     Allergies  Allergen Reactions   Astelin  [Azelastine  Hcl] Palpitations    Medication list has been reviewed and updated.  Current Outpatient Medications on File Prior to Visit  Medication Sig Dispense Refill   aspirin  EC 81 MG tablet Take 1 tablet (81 mg total) by mouth daily. Swallow whole.     atorvastatin (LIPITOR) 80 MG tablet Take 80 mg by mouth at bedtime.     Calcium Carbonate (CALTRATE 600 PO) Take 1 tablet by mouth daily.     cholecalciferol (VITAMIN D3) 25 MCG (1000 UNIT) tablet Take 1,000 Units by mouth daily.     denosumab  (PROLIA ) 60 MG/ML SOSY injection Inject 60 mg into the skin every 6 (six) months. Dx code: M81.0 1 mL 0   Docusate Calcium (STOOL SOFTENER PO) Take 1 capsule by mouth daily.     FIBER PO Take 2 tablets by mouth daily.     fluticasone (FLONASE) 50 MCG/ACT nasal spray Place 2 sprays into both nostrils daily as needed.     Melatonin 3 MG CAPS Take 1 capsule (3 mg total) by mouth at bedtime.     Multiple Vitamins-Minerals (CENTRUM SILVER) CHEW Chew by mouth.     multivitamin-lutein (OCUVITE-LUTEIN) CAPS capsule Take 1 capsule by mouth daily.     pantoprazole  (PROTONIX ) 20 MG tablet TAKE 1 TABLET BY MOUTH DAILY  BEFORE BREAKFAST 100 tablet 2   sodium chloride  (OCEAN) 0.65 % SOLN nasal spray Place 1 spray into both nostrils daily as needed for congestion.     Current Facility-Administered Medications on File Prior to Visit   Medication Dose Route Frequency Provider Last Rate Last Admin   [START ON 09/25/2024] denosumab  (PROLIA ) injection 60 mg  60 mg Subcutaneous Q6 months Paz, Jose E, MD        Review of Systems:  As per HPI- otherwise negative.   Physical Examination: There were no vitals filed for this visit. There were no vitals filed for this visit. There is no height or weight on file to calculate BMI. Ideal Body Weight:    GEN: no acute distress. HEENT: Atraumatic, Normocephalic.  Ears and Nose: No external deformity. CV: RRR, No M/G/R. No JVD. No thrill. No extra heart sounds. PULM: CTA B, no wheezes, crackles, rhonchi. No retractions. No resp. distress. No accessory muscle use. ABD: S, NT, ND, +BS. No rebound. No HSM. EXTR: No c/c/e PSYCH: Normally interactive. Conversant.    Assessment and Plan: No diagnosis found.  Assessment &  Plan   Signed Harlene Schroeder, MD

## 2024-04-12 ENCOUNTER — Ambulatory Visit (INDEPENDENT_AMBULATORY_CARE_PROVIDER_SITE_OTHER): Admitting: Family Medicine

## 2024-04-12 VITALS — BP 144/80 | HR 98 | Ht 61.0 in | Wt 136.2 lb

## 2024-04-12 DIAGNOSIS — M25552 Pain in left hip: Secondary | ICD-10-CM

## 2024-04-12 DIAGNOSIS — W19XXXD Unspecified fall, subsequent encounter: Secondary | ICD-10-CM | POA: Diagnosis not present

## 2024-04-12 DIAGNOSIS — G4700 Insomnia, unspecified: Secondary | ICD-10-CM | POA: Diagnosis not present

## 2024-04-12 DIAGNOSIS — K219 Gastro-esophageal reflux disease without esophagitis: Secondary | ICD-10-CM | POA: Diagnosis not present

## 2024-04-12 MED ORDER — HYDROCODONE-ACETAMINOPHEN 5-325 MG PO TABS: 0.5000 | ORAL_TABLET | Freq: Four times a day (QID) | ORAL | 0 refills | Status: DC | PRN

## 2024-04-12 MED ORDER — MELOXICAM 7.5 MG PO TABS: 7.5000 mg | ORAL_TABLET | Freq: Every day | ORAL | 0 refills | Status: DC

## 2024-04-12 NOTE — Patient Instructions (Signed)
 Good to see you today- I am sorry you are hurting!   Use the meloxicam  as needed for pain If pain is not controlled can add hydrocodone as needed- however this medication can make you drowsy.  If this is not keeping your pain under control please contact me!

## 2024-04-13 NOTE — Progress Notes (Signed)
 [facility] at [facility] [address], [address] [place], [place] [address] [phone]  Date:  04/16/2024   Name:  Kimberly Rodriguez   DOB:  [birth year]   MRN:  [id number]  PCP:  Amon Aloysius BRAVO, MD    Chief Complaint: Follow-up (I have soreness on my bottom L side )   History of Present Illness:  Kimberly Rodriguez is a 89 y.o. very pleasant female patient who presents with the following:  Patient seen today for short-term follow-up of back and hip pain.  I saw her last week after recent mechanical fall at home.  She was seen in the ER at [facility] on 10/21 where plain films of her left hip, CT scan of lumbar spine and pelvis did not reveal any acute fracture except for a possible compression at L1/L2 At our visit last week her main issue was left hip pain which is making it hard to get around.  I gave her meloxicam  and hydrocodone 5 to use if needed  Discussed the use of AI scribe software for clinical note transcription with the patient, who gave verbal consent to proceed.  History of Present Illness Kimberly Rodriguez is a 89 year old female who presents with pain management and mobility issues. She is accompanied by a friend.  She experiences pain that is managed with medication, which she finds effective. She takes the medication as needed and feels it has contributed to her improvement. She is significantly better than when I saw her last week   She describes an ache that occurs with certain movements, particularly when sitting on her right side, which alleviates some pressure. She was in significant pain last Thursday but notes improvement since then.  She has difficulty with dressing, requiring assistance due to challenges with getting her legs into clothing. 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 Georgina, ENT   Microhematuria    s/p eval by urology 2008: U/S, CT, cysto w/ neg Bx (dx w/ a renal cyst, see reports)   Osteoarthritis    h/o back pain, sees ortho s/p shots   Osteoporosis     Past Surgical History:  Procedure Laterality Date   ABDOMINAL HYSTERECTOMY     no oophorectomy per pt   APPENDECTOMY     COLONOSCOPY     DILATION AND CURETTAGE OF UTERUS     TONSILLECTOMY      Social History   Tobacco Use   Smoking status: Former   Smokeless tobacco: Never  Substance Use Topics   Alcohol  use: Yes    Comment: socially -wine   Drug use: No    Family History  Problem Relation Age of Onset   Heart disease Father    Prostate cancer Father    Heart disease Mother    Colon cancer Neg Hx    Breast cancer Neg Hx    Diabetes Neg Hx    Osteoporosis Neg Hx     Allergies  Allergen Reactions   Astelin  [Azelastine  Hcl] Palpitations    Medication list has been reviewed and updated.  Current Outpatient Medications on File Prior to Visit  Medication Sig Dispense Refill   aspirin  EC 81 MG tablet Take 1 tablet (81 mg total) by mouth daily. Swallow whole.     atorvastatin (LIPITOR) 80 MG tablet Take 80 mg by mouth at bedtime.     Calcium Carbonate  (CALTRATE 600 PO) Take 1 tablet by mouth daily.     cholecalciferol (VITAMIN D3) 25 MCG (1000 UNIT) tablet Take 1,000 Units by mouth daily.     denosumab  (PROLIA ) 60 MG/ML SOSY injection Inject 60 mg into the skin every 6 (six) months. Dx code: M81.0 1 mL 0   Docusate Calcium (STOOL SOFTENER PO) Take 1 capsule by mouth daily.     FIBER PO Take 2 tablets by mouth daily.     fluticasone (FLONASE) 50 MCG/ACT nasal spray Place 2 sprays into both nostrils daily as needed.     HYDROcodone-acetaminophen (NORCO/VICODIN) 5-325 MG tablet Take 0.5-1 tablets by mouth every 6 (six) hours as needed for moderate pain (pain score 4-6). 10 tablet 0   Melatonin 3 MG CAPS Take 1 capsule (3 mg total) by mouth at bedtime.     meloxicam  (MOBIC ) 7.5 MG tablet Take 1 tablet (7.5 mg total) by mouth daily. Use as needed for pain 30 tablet 0   Multiple Vitamins-Minerals (CENTRUM SILVER) CHEW Chew by mouth.     multivitamin-lutein (OCUVITE-LUTEIN) CAPS capsule Take 1 capsule by mouth daily.     pantoprazole  (PROTONIX ) 20 MG tablet TAKE 1 TABLET BY MOUTH DAILY  BEFORE BREAKFAST 100 tablet 2   sodium chloride  (OCEAN) 0.65 % SOLN nasal spray Place 1 spray into both nostrils daily as needed for congestion.     Current Facility-Administered Medications on File Prior to Visit  Medication Dose Route Frequency Provider Last Rate Last Admin   [START ON 09/25/2024] denosumab  (PROLIA ) injection 60 mg  60 mg Subcutaneous Q6 months Paz, Jose E, MD        Review of Systems:  As per HPI- otherwise negative.   Physical Examination: Vitals:   04/16/24 1036  BP: (!) 142/76  Pulse: 84  SpO2: 97%   Vitals:   04/16/24 1036  Weight: 136 lb 3.2 oz (61.8 kg)  Height: 5' 1 (1.549 m)   Body mass index is 25.73 kg/m. Ideal Body Weight: Weight in (lb) to have BMI = 25: 132  GEN: no acute distress.  Normal weight, HOH, looks well  HEENT: Atraumatic, Normocephalic.  Ears and Nose: No external deformity. CV: RRR, No M/G/R. No JVD.  No thrill. No extra heart sounds. PULM: CTA B, no wheezes, crackles, rhonchi. No retractions. No resp. distress. No accessory muscle use. EXTR: No c/c/e PSYCH: Normally interactive. Conversant.  She is getting around much better today- she is able to get to her feet more easily and is walking comfortable with her walker She is not having as much pain with flexion of her left hip today  Assessment and Plan: Left  hip pain  Assessment & Plan Pain following recent fall Pain improved, no fracture evident. Mobility better, pain localized to back with movement. Effective pain management with medication. - Continue pain medication as needed. Can stop when no longer needed Let me know if not continuing to do well  - Refill prescription if required more frequently but I don't think she will need   Signed Harlene Schroeder, MD

## 2024-04-16 ENCOUNTER — Encounter: Payer: Self-pay | Admitting: Family Medicine

## 2024-04-16 ENCOUNTER — Ambulatory Visit (INDEPENDENT_AMBULATORY_CARE_PROVIDER_SITE_OTHER): Admitting: Family Medicine

## 2024-04-16 VITALS — BP 142/76 | HR 84 | Ht 61.0 in | Wt 136.2 lb

## 2024-04-16 DIAGNOSIS — M25552 Pain in left hip: Secondary | ICD-10-CM | POA: Diagnosis not present

## 2024-04-16 NOTE — Patient Instructions (Signed)
 Good to see you today- you look great!  As long as you continue to make progress we are ok.  Please let me know if any concerns come up

## 2024-04-17 DIAGNOSIS — N182 Chronic kidney disease, stage 2 (mild): Secondary | ICD-10-CM | POA: Diagnosis not present

## 2024-04-17 DIAGNOSIS — I10 Essential (primary) hypertension: Secondary | ICD-10-CM | POA: Diagnosis not present

## 2024-04-24 ENCOUNTER — Other Ambulatory Visit: Payer: Self-pay | Admitting: Internal Medicine

## 2024-04-24 DIAGNOSIS — M810 Age-related osteoporosis without current pathological fracture: Secondary | ICD-10-CM

## 2024-04-24 DIAGNOSIS — M25552 Pain in left hip: Secondary | ICD-10-CM

## 2024-04-24 MED ORDER — PANTOPRAZOLE SODIUM 20 MG PO TBEC: 20.0000 mg | DELAYED_RELEASE_TABLET | Freq: Every day | ORAL | 1 refills | Status: AC

## 2024-04-24 MED ORDER — HYDROCODONE-ACETAMINOPHEN 5-325 MG PO TABS
0.5000 | ORAL_TABLET | Freq: Four times a day (QID) | ORAL | 0 refills | Status: AC | PRN
Start: 2024-04-24 — End: ?

## 2024-04-24 MED ORDER — ATORVASTATIN CALCIUM 80 MG PO TABS
80.0000 mg | ORAL_TABLET | Freq: Every day | ORAL | 1 refills | Status: AC
Start: 2024-04-24 — End: ?

## 2024-04-24 NOTE — Telephone Encounter (Signed)
 Multiple meds requested are available OTC or never prescribed by Dr. Amon. Refills sent for pantoprazole  and atorvastatin.

## 2024-04-24 NOTE — Telephone Encounter (Unsigned)
 Copied from CRM [id number]. Topic: Clinical - Medication Refill >> Apr 24, 2024 12:29 PM China J wrote: Medication:  aspirin  EC 81 MG tablet atorvastatin (LIPITOR) 80 MG tablet Calcium Carbonate (CALTRATE 600 PO) cholecalciferol (VITAMIN D3) 25 MCG (1000 UNIT) tablet denosumab  (PROLIA ) 60 MG/ML SOSY injection Docusate Calcium (STOOL SOFTENER PO) FIBER PO fluticasone (FLONASE) 50 MCG/ACT nasal spray HYDROcodone-acetaminophen (NORCO/VICODIN) 5-325 MG tablet Melatonin 3 MG CAPS meloxicam  (MOBIC ) 7.5 MG tablet Multiple Vitamins-Minerals (CENTRUM SILVER) CHEW multivitamin-lutein (OCUVITE-LUTEIN) CAPS capsule pantoprazole  (PROTONIX ) 20 MG tablet sodium chloride  (OCEAN) 0.65 % SOLN nasal spray  Has the patient contacted their pharmacy? Yes (Agent: If no, request that the patient contact the pharmacy for the refill. If patient does not wish to contact the pharmacy document the reason why and proceed with request.) (Agent: If yes, when and what did the pharmacy advise?) The pharmacy is needing a script sent since they are switching pharmacies.  This is the patient's preferred pharmacy:  [facility] [place], [place] - [address] [place] [address] Phone: [phone] Fax: [phone]  Is this the correct pharmacy for this prescription? Yes If no, delete pharmacy and type the correct one.   Has the prescription been filled recently? No  Is the patient out of the medication? No  Has the patient been seen for an appointment in the last year OR does the patient have an upcoming appointment? Yes  Can we respond through MyChart? No Please call the patient's niece for any updates: [phone]  Agent: Please be advised that Rx refills may take up to 3 business days. We ask that you follow-up with your pharmacy.

## 2024-04-30 DIAGNOSIS — M79672 Pain in left foot: Secondary | ICD-10-CM | POA: Diagnosis not present

## 2024-04-30 DIAGNOSIS — B351 Tinea unguium: Secondary | ICD-10-CM | POA: Diagnosis not present

## 2024-04-30 DIAGNOSIS — L84 Corns and callosities: Secondary | ICD-10-CM | POA: Diagnosis not present

## 2024-04-30 DIAGNOSIS — M79671 Pain in right foot: Secondary | ICD-10-CM | POA: Diagnosis not present

## 2024-05-06 DIAGNOSIS — R0689 Other abnormalities of breathing: Secondary | ICD-10-CM | POA: Diagnosis not present

## 2024-05-06 DIAGNOSIS — F332 Major depressive disorder, recurrent severe without psychotic features: Secondary | ICD-10-CM | POA: Diagnosis not present

## 2024-05-06 DIAGNOSIS — R45851 Suicidal ideations: Secondary | ICD-10-CM | POA: Diagnosis not present

## 2024-05-06 DIAGNOSIS — F32A Depression, unspecified: Secondary | ICD-10-CM | POA: Diagnosis not present

## 2024-05-06 DIAGNOSIS — I10 Essential (primary) hypertension: Secondary | ICD-10-CM | POA: Diagnosis not present

## 2024-05-07 DIAGNOSIS — F32A Depression, unspecified: Secondary | ICD-10-CM | POA: Diagnosis not present

## 2024-05-07 DIAGNOSIS — R45851 Suicidal ideations: Secondary | ICD-10-CM | POA: Diagnosis not present

## 2024-05-07 DIAGNOSIS — F332 Major depressive disorder, recurrent severe without psychotic features: Secondary | ICD-10-CM | POA: Diagnosis not present

## 2024-05-11 ENCOUNTER — Telehealth: Payer: Self-pay | Admitting: *Deleted

## 2024-05-11 DIAGNOSIS — H353221 Exudative age-related macular degeneration, left eye, with active choroidal neovascularization: Secondary | ICD-10-CM | POA: Diagnosis not present

## 2024-05-11 DIAGNOSIS — H35721 Serous detachment of retinal pigment epithelium, right eye: Secondary | ICD-10-CM | POA: Diagnosis not present

## 2024-05-11 DIAGNOSIS — I619 Nontraumatic intracerebral hemorrhage, unspecified: Secondary | ICD-10-CM

## 2024-05-11 DIAGNOSIS — H3581 Retinal edema: Secondary | ICD-10-CM | POA: Diagnosis not present

## 2024-05-11 DIAGNOSIS — H353231 Exudative age-related macular degeneration, bilateral, with active choroidal neovascularization: Secondary | ICD-10-CM | POA: Diagnosis not present

## 2024-05-11 DIAGNOSIS — H35363 Drusen (degenerative) of macula, bilateral: Secondary | ICD-10-CM | POA: Diagnosis not present

## 2024-05-11 NOTE — Telephone Encounter (Signed)
 Copied from CRM [id number]. Topic: Clinical - Order For Equipment >> May 10, 2024  5:02 PM Sasha M wrote: Reason for CRM: Pt called in to request an order for a new walker. Pt is currently living at [facility] [address] [place]. She had a temp with her today who provided me with the coordinator phone number to call if we need to verify any information. Pam - [phone] Pt will be moving to [facility] but date of move is unknown at this time

## 2024-05-11 NOTE — Telephone Encounter (Signed)
 Okay to give walker?

## 2024-05-12 DIAGNOSIS — S7002XA Contusion of left hip, initial encounter: Secondary | ICD-10-CM | POA: Diagnosis not present

## 2024-05-12 DIAGNOSIS — S0093XA Contusion of unspecified part of head, initial encounter: Secondary | ICD-10-CM | POA: Diagnosis not present

## 2024-05-12 DIAGNOSIS — N83201 Unspecified ovarian cyst, right side: Secondary | ICD-10-CM | POA: Diagnosis not present

## 2024-05-12 DIAGNOSIS — I10 Essential (primary) hypertension: Secondary | ICD-10-CM | POA: Diagnosis not present

## 2024-05-12 DIAGNOSIS — S7001XA Contusion of right hip, initial encounter: Secondary | ICD-10-CM | POA: Diagnosis not present

## 2024-05-12 DIAGNOSIS — W19XXXA Unspecified fall, initial encounter: Secondary | ICD-10-CM | POA: Diagnosis not present

## 2024-05-12 DIAGNOSIS — S0990XA Unspecified injury of head, initial encounter: Secondary | ICD-10-CM | POA: Diagnosis not present

## 2024-05-12 DIAGNOSIS — M25552 Pain in left hip: Secondary | ICD-10-CM | POA: Diagnosis not present

## 2024-05-14 ENCOUNTER — Telehealth: Payer: Self-pay

## 2024-05-14 DIAGNOSIS — M810 Age-related osteoporosis without current pathological fracture: Secondary | ICD-10-CM | POA: Diagnosis not present

## 2024-05-14 NOTE — Telephone Encounter (Signed)
 Copied from CRM [id number]. Topic: General - Other >> May 14, 2024 11:29 AM Mercedes MATSU wrote: Reason for CRM: Patient is moving back to assistant living, will be faxing over forms for a FOT and [place] completed for patient.   Almarie Masters Administrator @ [facility] in [place] No call back is needed.

## 2024-05-14 NOTE — Telephone Encounter (Signed)
 Forms received. Will have to discuss w/ Padonda tomorrow to see if she will agree to sign these forms.

## 2024-05-14 NOTE — Addendum Note (Signed)
 Addended by: Princess Karnes D on: 05/14/2024 07:44 AM   Modules accepted: Orders

## 2024-05-14 NOTE — Telephone Encounter (Signed)
 Per Padonda- okay to give order. Order placed. Message sent to our reps at [facility].

## 2024-05-15 DIAGNOSIS — Z0279 Encounter for issue of other medical certificate: Secondary | ICD-10-CM

## 2024-05-15 NOTE — Telephone Encounter (Signed)
 Forms completed by Padonda while PCP is out on medical leave. Forms faxed to [facility] at [phone]. Forms sent for scanning.

## 2024-05-15 NOTE — Telephone Encounter (Signed)
 Forms refaxed to new number provided.

## 2024-05-15 NOTE — Telephone Encounter (Signed)
 Vicci, China E   05/15/2024  2:26 PM  Representative from [facility] is calling in to give us  an updated fax number to send documents to as the one we faxed the paperwork to was incorrect.   Please fax to [phone]

## 2024-06-26 ENCOUNTER — Other Ambulatory Visit (HOSPITAL_BASED_OUTPATIENT_CLINIC_OR_DEPARTMENT_OTHER): Payer: Self-pay

## 2024-06-26 ENCOUNTER — Ambulatory Visit (INDEPENDENT_AMBULATORY_CARE_PROVIDER_SITE_OTHER): Admitting: Internal Medicine

## 2024-06-26 VITALS — BP 138/70 | HR 73 | Temp 97.8°F | Resp 12 | Ht 61.0 in | Wt 149.6 lb

## 2024-06-26 DIAGNOSIS — L989 Disorder of the skin and subcutaneous tissue, unspecified: Secondary | ICD-10-CM

## 2024-06-26 DIAGNOSIS — M150 Primary generalized (osteo)arthritis: Secondary | ICD-10-CM | POA: Diagnosis not present

## 2024-06-26 DIAGNOSIS — R739 Hyperglycemia, unspecified: Secondary | ICD-10-CM | POA: Diagnosis not present

## 2024-06-26 DIAGNOSIS — I10 Essential (primary) hypertension: Secondary | ICD-10-CM | POA: Diagnosis not present

## 2024-06-26 DIAGNOSIS — F325 Major depressive disorder, single episode, in full remission: Secondary | ICD-10-CM

## 2024-06-26 DIAGNOSIS — E7800 Pure hypercholesterolemia, unspecified: Secondary | ICD-10-CM | POA: Diagnosis not present

## 2024-06-26 LAB — BASIC METABOLIC PANEL WITH GFR
BUN: 15 mg/dL (ref 6–23)
CO2: 30 meq/L (ref 19–32)
Calcium: 9.3 mg/dL (ref 8.4–10.5)
Chloride: 105 meq/L (ref 96–112)
Creatinine, Ser: 0.78 mg/dL (ref 0.40–1.20)
GFR: 64.6 mL/min
Glucose, Bld: 78 mg/dL (ref 70–99)
Potassium: 4.2 meq/L (ref 3.5–5.1)
Sodium: 140 meq/L (ref 135–145)

## 2024-06-26 LAB — HEMOGLOBIN A1C: Hgb A1c MFr Bld: 5.6 % (ref 4.6–6.5)

## 2024-06-26 MED ORDER — COMIRNATY 30 MCG/0.3ML IM SUSY
0.3000 mL | PREFILLED_SYRINGE | Freq: Once | INTRAMUSCULAR | 0 refills | Status: AC
  Filled 2024-06-26: qty 0.3, 1d supply, fill #0

## 2024-06-26 NOTE — Patient Instructions (Addendum)
 THE FOLLOWING IS A SUMMARY OF YOUR INSTRUCTION PLEASE REVIEW THEM BEFORE YOU LEAVE THE OFFICE AND LET US  KNOW IF YOU HAVE QUESTIONS    GO TO THE LAB :  Get the blood work    Go to the front desk for the checkout Please make an appointment for a checkup in 4 months     SKIN LESION OF LEFT ANTERIOR CHEST:   -You are referred to dermatology for evaluation and possible biopsy. -Please contact us  if you are not seen by dermatology within two weeks.  PRIMARY HYPERTENSION: Your blood pressure is well controlled.    HYPERLIPIDEMIA: Your cholesterol levels are well controlled with atorvastatin . -Continue taking atorvastatin  as prescribed.  OSTEOPOROSIS: Your osteoporosis is managed with Prolia . -Continue Prolia  as scheduled.  DEPRESSION: You have been experiencing frustration due to your living situation and were recently treated for suicidal thoughts. -Reach out for help if you experience any suicidal thoughts.  DEGENERATIVE JOINT DISEASE:  You have joint pain managed with hydrocodone . Recommend to stop meloxicam  due to gastrointestinal risks. You can take Tylenol  100 mg: 2 tablets up to three times a day for pain management.  COVID-19 VACCINATION:   -It is recommended that you get a COVID-19 vaccine, which is available at the pharmacy or [facility].

## 2024-06-26 NOTE — Assessment & Plan Note (Addendum)
 Skin lesion, L anterior chest Chronic lesion, itchy, bleeds, lump present.  On exam seems to be a granuloma, referred to dermatology, likely will need a biopsy. Encouraged to contact if not seen by dermatology within two weeks. HTN No medications, BP looks good today.  Check a BMP Hyperglycemia per chart review, check A1c. Hyperlipidemia Well controlled on atorvastatin .  Osteoporosis Managed with Prolia .  Next injection around April 2026. Depression Recent suicidal ideation prompted the ER visit back in November 2025.  Symptoms triggered by her overall health and the fact that he is living in a nursing home with very little activities to do.  Was recommended Zoloft, currently not taking it, suicidal thoughts have not returned. - Encouraged to reach out for help if suicidal thoughts recur. DJD: Managed with hydrocodone  as needed. Meloxicam  is in her medication list but I recommend not to take it due to potential for GI side effects.  Continue with hydrocodone  which she reports does not cause drowsiness, also Tylenol . Fall: Evaluated at the ER in November 2025, no further falls. General health maintenance Has not received COVID vaccine in two years. Received flu shot. - Recommended COVID vaccine, available at pharmacy or [facility]. RTC 4 m

## 2024-06-26 NOTE — Progress Notes (Signed)
 "  Subjective:    Patient ID: Kimberly Rodriguez, female    DOB: 1926/09/23, 89 y.o.   MRN: [id number]  DOS:  06/26/2024 Follow-up, here with Kimberly Rodriguez  Discussed the use of AI scribe software for clinical note transcription with the patient, who gave verbal consent to proceed.  History of Present Illness Kimberly Rodriguez is a 89 year old female who presents for a follow-up Cutaneous lesions - Lesion on left upper chest present for 2-3 months - Lesion described as a lump that bleeds and is covered with a Band-Aid - No prior dermatologic evaluation for these lesions  Mood and mental health - Frustration and fatigue related to room isolation due to COVID-19 precautions - No current suicidal ideation - Started on Zoloft after emergency room visit in November for suicidal ideation, currently not taking it. - No participation in counseling since discharge  Mobility and fall history - Fall in November, evaluated in emergency room with negative imaging - Uses a walker for ambulation - No further falls since November - Does not feel physical therapy is needed  Cognitive function - Mild age-appropriate forgetfulness - No major memory problems per caregiver  Medication use and pain management - Takes atorvastatin  for hyperlipidemia - Uses hydrocodone  as needed for pain without drowsiness - Uncertain about meloxicam  use  Covid-19 precautions and vaccination status - Resides in assisted living facility with room isolation to prevent COVID-19 outbreaks. - No COVID-19 vaccine received in past two years    Review of Systems See above   Past Medical History:  Diagnosis Date   Anemia    Blood transfusion without reported diagnosis    Cataract    Closed left ankle fracture 01/2019   no surgery   Eustachian tube dysfunction    Right side, Dr. Georgina, ENT   Microhematuria    s/p eval by urology 2008: U/S, CT, cysto w/ neg Bx (dx w/ a renal cyst, see reports)   Osteoarthritis    h/o back  pain, sees ortho s/p shots   Osteoporosis     Past Surgical History:  Procedure Laterality Date   ABDOMINAL HYSTERECTOMY     no oophorectomy per pt   APPENDECTOMY     COLONOSCOPY     DILATION AND CURETTAGE OF UTERUS     TONSILLECTOMY      Current Outpatient Medications  Medication Instructions   aspirin  EC 81 mg, Oral, Daily, Swallow whole.   atorvastatin  (LIPITOR) 80 mg, Oral, Daily at bedtime   Calcium  Carbonate (CALTRATE 600 PO) 1 tablet, Daily   cholecalciferol (VITAMIN D3) 1,000 Units, Daily   COVID-19 mRNA vaccine, Pfizer, (COMIRNATY ) syringe 0.3 mLs, Intramuscular,  Once   Docusate Calcium  (STOOL SOFTENER PO) 1 capsule, Daily   FIBER PO 2 tablets, Daily   fluticasone (FLONASE) 50 MCG/ACT nasal spray 2 sprays, Daily PRN   HYDROcodone -acetaminophen  (NORCO/VICODIN) 5-325 MG tablet 0.5-1 tablets, Oral, Every 6 hours PRN   Melatonin 3 mg, Daily at bedtime   Multiple Vitamins-Minerals (CENTRUM SILVER) CHEW Chew by mouth.   multivitamin-lutein (OCUVITE-LUTEIN) CAPS capsule 1 capsule, Daily   pantoprazole  (PROTONIX ) 20 mg, Oral, Daily before breakfast   Prolia  60 mg, Subcutaneous, Every 6 months, Dx code: M81.0   sodium chloride  (OCEAN) 0.65 % SOLN nasal spray 1 spray, Daily PRN       Objective:   Physical Exam BP 138/70 (BP Location: Right Arm, Patient Position: Sitting, Cuff Size: Normal)   Pulse 73   Temp 97.8 F (36.6 C) (Oral)  Resp 12   Ht 5' 1 (1.549 m)   Wt 149 lb 9.6 oz (67.9 kg)   SpO2 97%   BMI 28.27 kg/m  General:   Well developed, NAD, BMI noted. HEENT:  Normocephalic . Face symmetric, atraumatic Lungs:  CTA B Normal respiratory effort, no intercostal retractions, no accessory muscle use. Heart: RRR,  no murmur.  Lower extremities: no pretibial edema bilaterally  Skin: At the left anterior upper chest has a granuloma type of lesion, see picture.  Nearby she has a SK. Neurologic:  alert & oriented X3.  Speech normal, gait appropriate for age,  uses a walker. Psych--  Cognition and judgment appear intact.  Cooperative with normal attention span and concentration.  Behavior appropriate. No anxious or depressed appearing.      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 Kassie, work-up for secondary etiologies negative, rx Prolia  03/2018 Microhematuria  s/p eval by urology 2008: U/S, CT, cysto w/ neg Bx (dx w/ a renal cyst, see reports) ET dysfuntion saw ENT Dr Georgina 2016 Anemia, saw GI: 06-2016: Cscope: polyps, tubular adenoma; EGD: cameron lesions, no Bx Increased AP: liver US  10/2023 negative.  We agreed observation see office visit 11/21/2023  Assessment & Plan Skin lesion, L anterior chest Chronic lesion, itchy, bleeds, lump present.  On exam seems to be a granuloma, referred to dermatology, likely will need a biopsy. Encouraged to contact if not seen by dermatology within two weeks. HTN No medications, BP looks good today.  Check a BMP Hyperglycemia per chart review, check A1c. Hyperlipidemia Well controlled on atorvastatin .  Osteoporosis Managed with Prolia .  Next injection around April 2026. Depression Recent suicidal ideation prompted the ER visit back in November 2025.  Symptoms triggered by her overall health and the fact that he is living in a nursing home with very little activities to do.  Was recommended Zoloft, currently not taking it, suicidal thoughts have not returned. - Encouraged to reach out for help if suicidal thoughts recur. DJD: Managed with hydrocodone  as needed. Meloxicam  is in her medication list but I recommend not  to take it due to potential for GI side effects.  Continue with hydrocodone  which she reports does not cause drowsiness, also Tylenol . Fall: Evaluated at the ER in November 2025, no further falls. General health maintenance Has not received COVID vaccine in two years. Received flu shot. - Recommended COVID vaccine, available at pharmacy or [facility]. RTC 4 m   "

## 2024-06-28 ENCOUNTER — Ambulatory Visit: Payer: Self-pay | Admitting: Internal Medicine

## 2024-10-24 ENCOUNTER — Ambulatory Visit: Admitting: Internal Medicine
